# Patient Record
Sex: Male | Born: 1960 | Race: White | Hispanic: No | Marital: Single | Smoking: Current every day smoker
Health system: Southern US, Community
[De-identification: ages and names within clinical notes are randomized; demographics above are authoritative.]

## PROBLEM LIST (undated history)

## (undated) DIAGNOSIS — G9340 Encephalopathy, unspecified: Secondary | ICD-10-CM

## (undated) DIAGNOSIS — G909 Disorder of the autonomic nervous system, unspecified: Secondary | ICD-10-CM

## (undated) DIAGNOSIS — Z21 Asymptomatic human immunodeficiency virus [HIV] infection status: Secondary | ICD-10-CM

## (undated) DIAGNOSIS — F1020 Alcohol dependence, uncomplicated: Secondary | ICD-10-CM

## (undated) DIAGNOSIS — J449 Chronic obstructive pulmonary disease, unspecified: Secondary | ICD-10-CM

## (undated) DIAGNOSIS — R4020 Unspecified coma: Secondary | ICD-10-CM

## (undated) DIAGNOSIS — B20 Human immunodeficiency virus [HIV] disease: Secondary | ICD-10-CM

## (undated) DIAGNOSIS — F0280 Dementia in other diseases classified elsewhere without behavioral disturbance: Secondary | ICD-10-CM

## (undated) DIAGNOSIS — F1010 Alcohol abuse, uncomplicated: Secondary | ICD-10-CM

## (undated) HISTORY — DX: Human immunodeficiency virus (HIV) disease: B20

## (undated) HISTORY — DX: Dementia in other diseases classified elsewhere without behavioral disturbance: F02.80

## (undated) HISTORY — DX: Encephalopathy, unspecified: G93.40

## (undated) HISTORY — DX: Unspecified coma: R40.20

## (undated) HISTORY — DX: Alcohol dependence, uncomplicated: F10.20

## (undated) HISTORY — DX: Chronic obstructive pulmonary disease, unspecified: J44.9

## (undated) HISTORY — DX: Asymptomatic human immunodeficiency virus (hiv) infection status: Z21

## (undated) HISTORY — DX: Disorder of the autonomic nervous system, unspecified: G90.9

## (undated) HISTORY — DX: Alcohol abuse, uncomplicated: F10.10

---

## 2000-03-18 ENCOUNTER — Emergency Department (HOSPITAL_COMMUNITY): Admission: EM | Admit: 2000-03-18 | Discharge: 2000-03-18 | Payer: Self-pay | Admitting: Emergency Medicine

## 2000-03-18 ENCOUNTER — Encounter: Payer: Self-pay | Admitting: Emergency Medicine

## 2008-04-28 ENCOUNTER — Inpatient Hospital Stay (HOSPITAL_COMMUNITY): Admission: EM | Admit: 2008-04-28 | Discharge: 2008-05-19 | Payer: Self-pay | Admitting: Emergency Medicine

## 2008-04-29 ENCOUNTER — Ambulatory Visit: Payer: Self-pay | Admitting: Internal Medicine

## 2008-04-29 ENCOUNTER — Encounter: Payer: Self-pay | Admitting: Internal Medicine

## 2008-04-29 LAB — CONVERTED CEMR LAB
HCV Ab: NEGATIVE
Hep B S Ab: NEGATIVE

## 2008-05-19 ENCOUNTER — Encounter: Payer: Self-pay | Admitting: Internal Medicine

## 2008-05-28 ENCOUNTER — Encounter: Payer: Self-pay | Admitting: Internal Medicine

## 2008-06-04 ENCOUNTER — Ambulatory Visit: Payer: Self-pay | Admitting: Internal Medicine

## 2008-06-04 DIAGNOSIS — B459 Cryptococcosis, unspecified: Secondary | ICD-10-CM

## 2008-06-04 DIAGNOSIS — B20 Human immunodeficiency virus [HIV] disease: Secondary | ICD-10-CM | POA: Insufficient documentation

## 2008-06-04 DIAGNOSIS — F17200 Nicotine dependence, unspecified, uncomplicated: Secondary | ICD-10-CM

## 2008-06-04 LAB — CONVERTED CEMR LAB
ALT: 21 units/L (ref 0–53)
Basophils Absolute: 0.1 10*3/uL (ref 0.0–0.1)
Basophils Relative: 2 % — ABNORMAL HIGH (ref 0–1)
CO2: 24 meq/L (ref 19–32)
Eosinophils Absolute: 0.1 10*3/uL (ref 0.0–0.7)
Eosinophils Relative: 4 % (ref 0–5)
GFR calc Af Amer: >60 mL/min (ref >60)
HCT: 31.4 % — ABNORMAL LOW (ref 39.0–52.0)
Hemoglobin: 10.1 g/dL — ABNORMAL LOW (ref 13.0–17.0)
MCHC: 32.2 g/dL (ref 30.0–36.0)
Monocytes Absolute: 0.3 10*3/uL (ref 0.1–1.0)
Neutro Abs: 1 10*3/uL — ABNORMAL LOW (ref 1.7–7.7)
RDW: 15.1 % (ref 11.5–15.5)
Sodium: 137 meq/L (ref 135–145)
Total Bilirubin: 0.2 mg/dL — ABNORMAL LOW (ref 0.3–1.2)
Total Protein: 7.9 g/dL (ref 6.0–8.3)

## 2008-06-06 ENCOUNTER — Encounter: Payer: Self-pay | Admitting: Internal Medicine

## 2008-06-09 ENCOUNTER — Encounter: Payer: Self-pay | Admitting: Internal Medicine

## 2008-06-09 ENCOUNTER — Telehealth: Payer: Self-pay | Admitting: Licensed Clinical Social Worker

## 2008-07-01 ENCOUNTER — Ambulatory Visit: Payer: Self-pay | Admitting: Internal Medicine

## 2008-07-21 ENCOUNTER — Ambulatory Visit: Payer: Self-pay | Admitting: Internal Medicine

## 2008-07-21 LAB — CONVERTED CEMR LAB
ALT: 33 units/L (ref 0–53)
AST: 27 units/L (ref 0–37)
Albumin: 4 g/dL (ref 3.5–5.2)
Basophils Relative: 1 % (ref 0–1)
Calcium: 10.6 mg/dL — ABNORMAL HIGH (ref 8.4–10.5)
Chloride: 100 meq/L (ref 96–112)
Eosinophils Absolute: 0.4 10*3/uL (ref 0.0–0.7)
Eosinophils Relative: 7 % — ABNORMAL HIGH (ref 0–5)
HCT: 36.1 % — ABNORMAL LOW (ref 39.0–52.0)
HIV 1 RNA Quant: 2100 copies/mL — ABNORMAL HIGH (ref <48)
HIV-1 RNA Quant, Log: 3.32 — ABNORMAL HIGH (ref <1.68)
MCHC: 33.2 g/dL (ref 30.0–36.0)
MCV: 97.3 fL (ref 78.0–100.0)
Monocytes Relative: 8 % (ref 3–12)
Neutrophils Relative %: 67 % (ref 43–77)
Platelets: 208 10*3/uL (ref 150–400)
Potassium: 4.5 meq/L (ref 3.5–5.3)
Sodium: 136 meq/L (ref 135–145)
Total CHOL/HDL Ratio: 7.2

## 2008-07-29 ENCOUNTER — Encounter: Payer: Self-pay | Admitting: Internal Medicine

## 2008-08-07 ENCOUNTER — Ambulatory Visit: Payer: Self-pay | Admitting: Internal Medicine

## 2008-08-07 LAB — CONVERTED CEMR LAB

## 2008-08-08 ENCOUNTER — Telehealth: Payer: Self-pay | Admitting: Internal Medicine

## 2008-08-19 ENCOUNTER — Encounter: Payer: Self-pay | Admitting: Internal Medicine

## 2008-09-03 ENCOUNTER — Ambulatory Visit: Payer: Self-pay | Admitting: Internal Medicine

## 2008-09-03 LAB — CONVERTED CEMR LAB
ALT: 12 units/L (ref 0–53)
BUN: 19 mg/dL (ref 6–23)
CO2: 23 meq/L (ref 19–32)
Calcium: 10 mg/dL (ref 8.4–10.5)
Chloride: 103 meq/L (ref 96–112)
Creatinine, Ser: 1.46 mg/dL (ref 0.40–1.50)
Eosinophils Absolute: 0.2 10*3/uL (ref 0.0–0.7)
Eosinophils Relative: 4 % (ref 0–5)
Glucose, Bld: 95 mg/dL (ref 70–99)
HCT: 39.2 % (ref 39.0–52.0)
HIV-1 RNA Quant, Log: 2.77 — ABNORMAL HIGH (ref <1.68)
Hemoglobin: 13.3 g/dL (ref 13.0–17.0)
Lymphocytes Relative: 21 % (ref 12–46)
Lymphs Abs: 0.9 10*3/uL (ref 0.7–4.0)
MCV: 92.5 fL (ref 78.0–100.0)
Monocytes Absolute: 0.3 10*3/uL (ref 0.1–1.0)
Monocytes Relative: 9 % (ref 3–12)
RBC: 4.24 M/uL (ref 4.22–5.81)
Total Bilirubin: 0.2 mg/dL — ABNORMAL LOW (ref 0.3–1.2)
WBC: 4 10*3/uL (ref 4.0–10.5)

## 2008-09-08 ENCOUNTER — Telehealth (INDEPENDENT_AMBULATORY_CARE_PROVIDER_SITE_OTHER): Payer: Self-pay | Admitting: *Deleted

## 2008-09-12 ENCOUNTER — Encounter: Payer: Self-pay | Admitting: Internal Medicine

## 2008-10-21 ENCOUNTER — Ambulatory Visit: Payer: Self-pay | Admitting: Internal Medicine

## 2008-10-21 LAB — CONVERTED CEMR LAB
Crypto Ag: POSITIVE
HIV 1 RNA Quant: 78 copies/mL — ABNORMAL HIGH (ref <48)

## 2008-10-27 ENCOUNTER — Encounter: Payer: Self-pay | Admitting: Internal Medicine

## 2008-11-21 ENCOUNTER — Telehealth: Payer: Self-pay | Admitting: Internal Medicine

## 2008-11-25 ENCOUNTER — Ambulatory Visit: Payer: Self-pay | Admitting: Internal Medicine

## 2008-11-25 LAB — CONVERTED CEMR LAB: Crypto Ag: POSITIVE

## 2009-01-27 ENCOUNTER — Ambulatory Visit: Payer: Self-pay | Admitting: Internal Medicine

## 2009-01-27 DIAGNOSIS — M67919 Unspecified disorder of synovium and tendon, unspecified shoulder: Secondary | ICD-10-CM | POA: Insufficient documentation

## 2009-01-27 DIAGNOSIS — M719 Bursopathy, unspecified: Secondary | ICD-10-CM

## 2009-01-27 LAB — CONVERTED CEMR LAB
ALT: 9 units/L (ref 0–53)
AST: 16 units/L (ref 0–37)
Alkaline Phosphatase: 105 units/L (ref 39–117)
BUN: 25 mg/dL — ABNORMAL HIGH (ref 6–23)
Basophils Absolute: 0 10*3/uL (ref 0.0–0.1)
Creatinine, Ser: 1.87 mg/dL — ABNORMAL HIGH (ref 0.40–1.50)
Eosinophils Absolute: 0.1 10*3/uL (ref 0.0–0.7)
Eosinophils Relative: 4 % (ref 0–5)
HCT: 44.9 % (ref 39.0–52.0)
Lymphocytes Relative: 23 % (ref 12–46)
Lymphs Abs: 0.8 10*3/uL (ref 0.7–4.0)
Neutrophils Relative %: 64 % (ref 43–77)
Platelets: 172 10*3/uL (ref 150–400)
Potassium: 4.2 meq/L (ref 3.5–5.3)
RDW: 13.5 % (ref 11.5–15.5)
WBC: 3.4 10*3/uL — ABNORMAL LOW (ref 4.0–10.5)

## 2009-06-02 ENCOUNTER — Ambulatory Visit: Payer: Self-pay | Admitting: Internal Medicine

## 2009-11-19 ENCOUNTER — Encounter (INDEPENDENT_AMBULATORY_CARE_PROVIDER_SITE_OTHER): Payer: Self-pay | Admitting: *Deleted

## 2010-04-01 NOTE — Assessment & Plan Note (Signed)
Summary: F/U/VS   CC:  follow-up visit.  History of Present Illness: Pau.l is in for his routine visit. He denies any problems since his last visit.  He can name all of his medications it appears to be taking them correctly.  He denies missing any doses.  He says he has not started back to work because he cannot find work.  He says he is not ready to quit smoking cigarettes.  He has not been sexually active  Preventive Screening-Counseling & Management  Alcohol-Tobacco     Alcohol drinks/day: 1     Alcohol type: all     Smoking Status: current     Smoking Cessation Counseling: yes     Packs/Day: 0.5     Year Started: 1990     Year Quit: May, 2010  Caffeine-Diet-Exercise     Caffeine use/day: yes     Does Patient Exercise: yes     Type of exercise: walking     Exercise (avg: min/session): >60     Times/week: 7  Hep-HIV-STD-Contraception     HIV Risk: no risk noted     HIV Risk Counseling: not indicated-no HIV risk noted  Safety-Violence-Falls     Seat Belt Use: yes  Comments: declined condoms      Sexual History:  Kind of in a relationship.        Drug Use:  former.     Prior Medication List:  ATRIPLA 600-200-300 MG TABS (EFAVIRENZ-EMTRICITAB-TENOFOVIR) Take 1 tablet by mouth once a day DIFLUCAN 200 MG TABS (FLUCONAZOLE) Take 2 tablets by mouth once a day SEPTRA DS 800-160 MG TABS (SULFAMETHOXAZOLE-TRIMETHOPRIM) Take 1 tablet by mouth once a day ZITHROMAX 600 MG TABS (AZITHROMYCIN) Take 2 tablets by mouth once a week ALEVE 220 MG TABS (NAPROXEN SODIUM) Take 1 tablet by mouth two times a day as needed for shoulder pain   Current Allergies (reviewed today): No known allergies  Social History: Sexual History:  Kind of in a relationship Drug Use:  former  Vital Signs:  Patient profile:   50 year old male Height:      67 inches (170.18 cm) Weight:      138.5 pounds (62.95 kg) BMI:     21.77 Temp:     97.6 degrees F (36.44 degrees C) oral Pulse rate:   85  / minute BP sitting:   127 / 82  (left arm) Cuff size:   regular  Vitals Entered By: Jennet Maduro RN (June 02, 2009 9:26 AM) CC: follow-up visit Is Patient Diabetic? No Pain Assessment Patient in pain? no      Nutritional Status BMI of 19 -24 = normal Nutritional Status Detail appetite "good"  Have you ever been in a relationship where you felt threatened, hurt or afraid?No   Does patient need assistance? Functional Status Self care Ambulation Normal Comments no missed odses of rxes   Physical Exam  General:  alert and well-nourished.   Mouth:  pharynx pink and moist, no erythema, no exudates, and fair dentition.   Lungs:  normal breath sounds, no crackles, and no wheezes.   Heart:  normal rate, regular rhythm, and no murmur.   Skin:  no rashes.   Psych:  normally interactive, good eye contact, not anxious appearing, not depressed appearing, and flat affect as usual.          Medication Adherence: 06/02/2009   Adherence to medications reviewed with patient. Counseling to provide adequate adherence provided   Prevention For Positives:  06/02/2009   Safe sex practices discussed with patient. Condoms offered.                             Impression & Recommendations:  Problem # 1:  HIV DISEASE (ICD-042) His adherence is very good.  I will continue his current regimen and check a full set of blood work today. I will change his prophylactic Septra two every Monday Wednesday Friday to see if that will help speed his CD4 reconstitution. His updated medication list for this problem includes:    Diflucan 200 Mg Tabs (Fluconazole) .Marland Kitchen... Take 2 tablets by mouth once a day    Septra Ds 800-160 Mg Tabs (Sulfamethoxazole-trimethoprim) .Marland Kitchen... Take 1 tablet by mouth every m, w, and f    Zithromax 600 Mg Tabs (Azithromycin) .Marland Kitchen... 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  Clinically his meningitis has responded well to therapy. Orders: Est. Patient Level IV ([address]109) T- * Misc. Laboratory test [phone])  Medications Added to Medication List This Visit: 1)  Septra Ds 800-160 Mg Tabs (Sulfamethoxazole-trimethoprim) .... Take 1 tablet by mouth every m, w, and f  Other Orders: T-CD4SP ([facility]) (CD4SP) T-HIV Viral Load [id number]) T-Comprehensive Metabolic Panel [phone]) T-Lipid Profile [id number]) T-CBC w/Diff [phone]) T-RPR (Syphilis) ([id number])  Patient Instructions: 1)  Please schedule a follow-up appointment in 3 months.

## 2010-04-01 NOTE — Miscellaneous (Signed)
Summary: RW update  Clinical Lists Changes  Observations: Added new observation of RWPARTICIP: Yes (11/19/2009 9:09)

## 2010-04-05 ENCOUNTER — Encounter (INDEPENDENT_AMBULATORY_CARE_PROVIDER_SITE_OTHER): Payer: Self-pay | Admitting: *Deleted

## 2010-04-08 ENCOUNTER — Encounter (INDEPENDENT_AMBULATORY_CARE_PROVIDER_SITE_OTHER): Payer: Self-pay | Admitting: *Deleted

## 2010-04-15 NOTE — Miscellaneous (Signed)
Summary: RW update  Clinical Lists Changes  Observations: Added new observation of PAYOR: Medicaid (04/05/2010 11:50)

## 2010-04-15 NOTE — Miscellaneous (Signed)
Summary: RW update  Clinical Lists Changes  Observations: Added new observation of HIV RISK BEH: MSM (04/05/2010 11:45) Added new observation of MARITAL STAT: Single (04/05/2010 11:45) Added new observation of GENDER: Male (04/05/2010 11:45) Added new observation of LATINO/HISP: No (04/05/2010 11:45) Added new observation of RACE: White (04/05/2010 11:45)

## 2010-04-15 NOTE — Miscellaneous (Signed)
  Clinical Lists Changes 

## 2010-06-02 LAB — T-HELPER CELL (CD4) - (RCID CLINIC ONLY)
CD4 % Helper T Cell: 13 % — ABNORMAL LOW (ref 33–55)
CD4 T Cell Abs: 90 uL — ABNORMAL LOW (ref 400–2700)

## 2010-06-05 LAB — T-HELPER CELL (CD4) - (RCID CLINIC ONLY)
CD4 % Helper T Cell: 10 % — ABNORMAL LOW (ref 33–55)
CD4 T Cell Abs: 90 uL — ABNORMAL LOW (ref 400–2700)

## 2010-06-06 LAB — T-HELPER CELL (CD4) - (RCID CLINIC ONLY)
CD4 % Helper T Cell: 14 % — ABNORMAL LOW (ref 33–55)
CD4 T Cell Abs: 110 uL — ABNORMAL LOW (ref 400–2700)

## 2010-06-10 LAB — CULTURE, BLOOD (ROUTINE X 2): Culture: NO GROWTH

## 2010-06-10 LAB — COMPREHENSIVE METABOLIC PANEL
ALT: 11 U/L (ref 0–53)
ALT: 11 U/L (ref 0–53)
AST: 13 U/L (ref 0–37)
Albumin: 2.8 g/dL — ABNORMAL LOW (ref 3.5–5.2)
Albumin: 3.4 g/dL — ABNORMAL LOW (ref 3.5–5.2)
Alkaline Phosphatase: 45 U/L (ref 39–117)
Alkaline Phosphatase: 56 U/L (ref 39–117)
BUN: 38 mg/dL — ABNORMAL HIGH (ref 6–23)
CO2: 20 mEq/L (ref 19–32)
Calcium: 8.2 mg/dL — ABNORMAL LOW (ref 8.4–10.5)
Chloride: 112 mEq/L (ref 96–112)
Creatinine, Ser: 1.15 mg/dL (ref 0.4–1.5)
GFR calc Af Amer: >60 mL/min (ref >60)
GFR calc non Af Amer: 60 mL/min — ABNORMAL LOW (ref >60)
GFR calc non Af Amer: >60 mL/min (ref >60)
Glucose, Bld: 122 mg/dL — ABNORMAL HIGH (ref 70–99)
Potassium: 3.5 mEq/L (ref 3.5–5.1)
Potassium: 4.4 mEq/L (ref 3.5–5.1)
Sodium: 141 mEq/L (ref 135–145)
Sodium: 143 mEq/L (ref 135–145)
Total Bilirubin: 0.6 mg/dL (ref 0.3–1.2)
Total Bilirubin: 0.8 mg/dL (ref 0.3–1.2)
Total Protein: 8.6 g/dL — ABNORMAL HIGH (ref 6.0–8.3)

## 2010-06-10 LAB — CSF CELL COUNT WITH DIFFERENTIAL
Eosinophils, CSF: 0 % (ref 0–1)
Lymphs, CSF: 91 % — ABNORMAL HIGH (ref 40–80)
Lymphs, CSF: 97 % — ABNORMAL HIGH (ref 40–80)
Monocyte-Macrophage-Spinal Fluid: 2 % — ABNORMAL LOW (ref 15–45)
Monocyte-Macrophage-Spinal Fluid: 4 % — ABNORMAL LOW (ref 15–45)
Other Cells, CSF: 0
RBC Count, CSF: 12 /mm3 — ABNORMAL HIGH
Segmented Neutrophils-CSF: 0 % (ref 0–6)
Segmented Neutrophils-CSF: 2 % (ref 0–6)
Segmented Neutrophils-CSF: 3 % (ref 0–6)
WBC, CSF: 59 /mm3 — ABNORMAL HIGH (ref 0–5)

## 2010-06-10 LAB — IRON AND TIBC
Saturation Ratios: 34 % (ref 20–55)
UIBC: 161 ug/dL

## 2010-06-10 LAB — BASIC METABOLIC PANEL
BUN: 16 mg/dL (ref 6–23)
BUN: 17 mg/dL (ref 6–23)
BUN: 10 mg/dL (ref 6–23)
BUN: 12 mg/dL (ref 6–23)
BUN: 19 mg/dL (ref 6–23)
BUN: 9 mg/dL (ref 6–23)
BUN: 9 mg/dL (ref 6–23)
CO2: 23 mEq/L (ref 19–32)
CO2: 21 mEq/L (ref 19–32)
CO2: 21 mEq/L (ref 19–32)
CO2: 23 mEq/L (ref 19–32)
CO2: 24 mEq/L (ref 19–32)
CO2: 25 mEq/L (ref 19–32)
Calcium: 7.9 mg/dL — ABNORMAL LOW (ref 8.4–10.5)
Calcium: 8 mg/dL — ABNORMAL LOW (ref 8.4–10.5)
Calcium: 7.7 mg/dL — ABNORMAL LOW (ref 8.4–10.5)
Calcium: 8.5 mg/dL (ref 8.4–10.5)
Calcium: 8.6 mg/dL (ref 8.4–10.5)
Calcium: 8.9 mg/dL (ref 8.4–10.5)
Chloride: 104 mEq/L (ref 96–112)
Chloride: 111 mEq/L (ref 96–112)
Chloride: 110 mEq/L (ref 96–112)
Chloride: 112 mEq/L (ref 96–112)
Chloride: 114 mEq/L — ABNORMAL HIGH (ref 96–112)
Chloride: 117 mEq/L — ABNORMAL HIGH (ref 96–112)
Chloride: 121 mEq/L — ABNORMAL HIGH (ref 96–112)
Creatinine, Ser: 1.21 mg/dL (ref 0.4–1.5)
Creatinine, Ser: 1.31 mg/dL (ref 0.4–1.5)
Creatinine, Ser: 1.46 mg/dL (ref 0.4–1.5)
Creatinine, Ser: 1.01 mg/dL (ref 0.4–1.5)
Creatinine, Ser: 1.02 mg/dL (ref 0.4–1.5)
Creatinine, Ser: 1.02 mg/dL (ref 0.4–1.5)
Creatinine, Ser: 1.03 mg/dL (ref 0.4–1.5)
Creatinine, Ser: 1.06 mg/dL (ref 0.4–1.5)
Creatinine, Ser: 1.06 mg/dL (ref 0.4–1.5)
Creatinine, Ser: 1.09 mg/dL (ref 0.4–1.5)
GFR calc Af Amer: >60 mL/min (ref >60)
GFR calc Af Amer: >60 mL/min (ref >60)
GFR calc Af Amer: >60 mL/min (ref >60)
GFR calc Af Amer: >60 mL/min (ref >60)
GFR calc Af Amer: >60 mL/min (ref >60)
GFR calc Af Amer: >60 mL/min (ref >60)
GFR calc Af Amer: >60 mL/min (ref >60)
GFR calc non Af Amer: 52 mL/min — ABNORMAL LOW (ref >60)
GFR calc non Af Amer: >60 mL/min (ref >60)
GFR calc non Af Amer: >60 mL/min (ref >60)
Glucose, Bld: 90 mg/dL (ref 70–99)
Glucose, Bld: 133 mg/dL — ABNORMAL HIGH (ref 70–99)
Potassium: 3.4 mEq/L — ABNORMAL LOW (ref 3.5–5.1)
Potassium: 3.6 mEq/L (ref 3.5–5.1)
Sodium: 138 mEq/L (ref 135–145)
Sodium: 138 mEq/L (ref 135–145)
Sodium: 140 mEq/L (ref 135–145)

## 2010-06-10 LAB — CBC
HCT: 27.1 % — ABNORMAL LOW (ref 39.0–52.0)
HCT: 29.5 % — ABNORMAL LOW (ref 39.0–52.0)
HCT: 39.7 % (ref 39.0–52.0)
HCT: 42.3 % (ref 39.0–52.0)
HCT: 47.8 % (ref 39.0–52.0)
Hemoglobin: 10.2 g/dL — ABNORMAL LOW (ref 13.0–17.0)
Hemoglobin: 10.3 g/dL — ABNORMAL LOW (ref 13.0–17.0)
Hemoglobin: 13.5 g/dL (ref 13.0–17.0)
Hemoglobin: 14.6 g/dL (ref 13.0–17.0)
Hemoglobin: 16.6 g/dL (ref 13.0–17.0)
MCHC: 35.9 g/dL (ref 30.0–36.0)
MCHC: 34.4 g/dL (ref 30.0–36.0)
MCHC: 34.9 g/dL (ref 30.0–36.0)
MCHC: 35.2 g/dL (ref 30.0–36.0)
MCHC: 35.6 g/dL (ref 30.0–36.0)
MCHC: 35.7 g/dL (ref 30.0–36.0)
MCHC: 35.9 g/dL (ref 30.0–36.0)
MCV: 87 fL (ref 78.0–100.0)
MCV: 86.9 fL (ref 78.0–100.0)
MCV: 87.4 fL (ref 78.0–100.0)
MCV: 87.9 fL (ref 78.0–100.0)
MCV: 88.2 fL (ref 78.0–100.0)
MCV: 89.4 fL (ref 78.0–100.0)
MCV: 89.6 fL (ref 78.0–100.0)
Platelets: 117 10*3/uL — ABNORMAL LOW (ref 150–400)
Platelets: 135 10*3/uL — ABNORMAL LOW (ref 150–400)
Platelets: 109 10*3/uL — ABNORMAL LOW (ref 150–400)
Platelets: 110 10*3/uL — ABNORMAL LOW (ref 150–400)
Platelets: 123 10*3/uL — ABNORMAL LOW (ref 150–400)
Platelets: 126 10*3/uL — ABNORMAL LOW (ref 150–400)
Platelets: 192 10*3/uL (ref 150–400)
Platelets: 86 10*3/uL — ABNORMAL LOW (ref 150–400)
Platelets: 95 10*3/uL — ABNORMAL LOW (ref 150–400)
RBC: 2.63 MIL/uL — ABNORMAL LOW (ref 4.22–5.81)
RBC: 2.66 MIL/uL — ABNORMAL LOW (ref 4.22–5.81)
RBC: 3.29 MIL/uL — ABNORMAL LOW (ref 4.22–5.81)
RBC: 3.3 MIL/uL — ABNORMAL LOW (ref 4.22–5.81)
RBC: 3.31 MIL/uL — ABNORMAL LOW (ref 4.22–5.81)
RBC: 3.34 MIL/uL — ABNORMAL LOW (ref 4.22–5.81)
RBC: 3.4 MIL/uL — ABNORMAL LOW (ref 4.22–5.81)
RBC: 3.88 MIL/uL — ABNORMAL LOW (ref 4.22–5.81)
RBC: 4.33 MIL/uL (ref 4.22–5.81)
RDW: 15.4 % (ref 11.5–15.5)
RDW: 15.4 % (ref 11.5–15.5)
RDW: 13.4 % (ref 11.5–15.5)
RDW: 13.7 % (ref 11.5–15.5)
RDW: 14 % (ref 11.5–15.5)
WBC: 3.3 10*3/uL — ABNORMAL LOW (ref 4.0–10.5)
WBC: 4.2 10*3/uL (ref 4.0–10.5)
WBC: 2.5 10*3/uL — ABNORMAL LOW (ref 4.0–10.5)
WBC: 2.7 10*3/uL — ABNORMAL LOW (ref 4.0–10.5)
WBC: 2.8 10*3/uL — ABNORMAL LOW (ref 4.0–10.5)
WBC: 2.8 10*3/uL — ABNORMAL LOW (ref 4.0–10.5)
WBC: 3 10*3/uL — ABNORMAL LOW (ref 4.0–10.5)
WBC: 3.2 10*3/uL — ABNORMAL LOW (ref 4.0–10.5)
WBC: 5.8 10*3/uL (ref 4.0–10.5)

## 2010-06-10 LAB — MAGNESIUM
Magnesium: 1.6 mg/dL (ref 1.5–2.5)
Magnesium: 2 mg/dL (ref 1.5–2.5)
Magnesium: 2.2 mg/dL (ref 1.5–2.5)
Magnesium: 2.5 mg/dL (ref 1.5–2.5)

## 2010-06-10 LAB — PHOSPHORUS: Phosphorus: 3 mg/dL (ref 2.3–4.6)

## 2010-06-10 LAB — DIFFERENTIAL
Basophils Absolute: 0.1 10*3/uL (ref 0.0–0.1)
Basophils Relative: 2 % — ABNORMAL HIGH (ref 0–1)
Eosinophils Absolute: 0 10*3/uL (ref 0.0–0.7)
Eosinophils Relative: 0 % (ref 0–5)
Monocytes Absolute: 0.4 10*3/uL (ref 0.1–1.0)
Monocytes Relative: 8 % (ref 3–12)

## 2010-06-10 LAB — RETICULOCYTES
RBC.: 2.83 MIL/uL — ABNORMAL LOW (ref 4.22–5.81)
Retic Ct Pct: 1 % (ref 0.4–3.1)

## 2010-06-10 LAB — CD4/CD8 (T-HELPER/T-SUPPRESSOR CELL)
CD4 absolute: 6 /uL — ABNORMAL LOW (ref 500–1900)
CD8 T Cell Abs: 59 /uL — ABNORMAL LOW (ref 230–1000)
CD8tox: 350 % — ABNORMAL HIGH (ref 15.0–40.0)
Ratio: 0.03 — ABNORMAL LOW (ref 1.0–3.0)

## 2010-06-10 LAB — URINALYSIS, ROUTINE W REFLEX MICROSCOPIC
Hgb urine dipstick: NEGATIVE
Protein, ur: 30 mg/dL — AB
Urobilinogen, UA: 1 mg/dL (ref 0.0–1.0)

## 2010-06-10 LAB — HIV ANTIBODY (ROUTINE TESTING W REFLEX): HIV: REACTIVE — AB

## 2010-06-10 LAB — CSF CULTURE W GRAM STAIN

## 2010-06-10 LAB — GC/CHLAMYDIA PROBE AMP, URINE
Chlamydia, Swab/Urine, PCR: NEGATIVE
GC Probe Amp, Urine: NEGATIVE

## 2010-06-10 LAB — GLUCOSE, CSF: Glucose, CSF: 16 mg/dL — ABNORMAL LOW (ref 43–76)

## 2010-06-10 LAB — VITAMIN B12
Vitamin B-12: 513 pg/mL (ref 211–911)
Vitamin B-12: 307 pg/mL (ref 211–911)

## 2010-06-10 LAB — VDRL, CSF: VDRL Quant, CSF: NONREACTIVE

## 2010-06-10 LAB — URINE MICROSCOPIC-ADD ON

## 2010-06-10 LAB — GRAM STAIN

## 2010-06-10 LAB — HEPATITIS B SURFACE ANTIGEN: Hepatitis B Surface Ag: NEGATIVE

## 2010-06-10 LAB — PROTIME-INR: INR: 1.3 (ref 0.00–1.49)

## 2010-06-10 LAB — PROTEIN, CSF: Total  Protein, CSF: 227 mg/dL — ABNORMAL HIGH (ref 15–45)

## 2010-06-10 LAB — CRYPTOCOCCAL ANTIGEN, CSF

## 2010-06-10 LAB — PROTEIN AND GLUCOSE, CSF: Glucose, CSF: 16 mg/dL — ABNORMAL LOW (ref 43–76)

## 2010-06-19 ENCOUNTER — Emergency Department (HOSPITAL_COMMUNITY): Payer: Medicaid Other

## 2010-06-19 ENCOUNTER — Inpatient Hospital Stay (HOSPITAL_COMMUNITY)
Admission: EM | Admit: 2010-06-19 | Discharge: 2010-07-03 | DRG: 974 | Disposition: A | Discharge status: Skilled Nursing Facility | Payer: Medicaid Other | Attending: Family Medicine | Admitting: Family Medicine

## 2010-06-19 DIAGNOSIS — Z5900 Homelessness unspecified: Secondary | ICD-10-CM

## 2010-06-19 DIAGNOSIS — R21 Rash and other nonspecific skin eruption: Secondary | ICD-10-CM | POA: Diagnosis not present

## 2010-06-19 DIAGNOSIS — Z9119 Patient's noncompliance with other medical treatment and regimen: Secondary | ICD-10-CM

## 2010-06-19 DIAGNOSIS — I959 Hypotension, unspecified: Secondary | ICD-10-CM | POA: Diagnosis present

## 2010-06-19 DIAGNOSIS — N179 Acute kidney failure, unspecified: Secondary | ICD-10-CM | POA: Diagnosis present

## 2010-06-19 DIAGNOSIS — F17200 Nicotine dependence, unspecified, uncomplicated: Secondary | ICD-10-CM | POA: Diagnosis present

## 2010-06-19 DIAGNOSIS — B20 Human immunodeficiency virus [HIV] disease: Secondary | ICD-10-CM

## 2010-06-19 DIAGNOSIS — Z681 Body mass index (BMI) 19 or less, adult: Secondary | ICD-10-CM

## 2010-06-19 DIAGNOSIS — E43 Unspecified severe protein-calorie malnutrition: Secondary | ICD-10-CM | POA: Diagnosis present

## 2010-06-19 DIAGNOSIS — Z79899 Other long term (current) drug therapy: Secondary | ICD-10-CM

## 2010-06-19 DIAGNOSIS — F0280 Dementia in other diseases classified elsewhere without behavioral disturbance: Secondary | ICD-10-CM | POA: Diagnosis present

## 2010-06-19 DIAGNOSIS — D61818 Other pancytopenia: Secondary | ICD-10-CM | POA: Diagnosis present

## 2010-06-19 DIAGNOSIS — F1410 Cocaine abuse, uncomplicated: Secondary | ICD-10-CM | POA: Diagnosis present

## 2010-06-19 DIAGNOSIS — E872 Acidosis, unspecified: Secondary | ICD-10-CM | POA: Diagnosis not present

## 2010-06-19 DIAGNOSIS — F1010 Alcohol abuse, uncomplicated: Secondary | ICD-10-CM | POA: Diagnosis present

## 2010-06-19 DIAGNOSIS — Z91199 Patient's noncompliance with other medical treatment and regimen due to unspecified reason: Secondary | ICD-10-CM

## 2010-06-19 DIAGNOSIS — R64 Cachexia: Secondary | ICD-10-CM | POA: Diagnosis present

## 2010-06-19 DIAGNOSIS — F1210 Cannabis abuse, uncomplicated: Secondary | ICD-10-CM | POA: Diagnosis present

## 2010-06-19 DIAGNOSIS — A812 Progressive multifocal leukoencephalopathy: Secondary | ICD-10-CM | POA: Diagnosis present

## 2010-06-19 DIAGNOSIS — R4182 Altered mental status, unspecified: Secondary | ICD-10-CM

## 2010-06-19 LAB — URINALYSIS, ROUTINE W REFLEX MICROSCOPIC
Hgb urine dipstick: NEGATIVE
Ketones, ur: NEGATIVE mg/dL
Nitrite: NEGATIVE
Specific Gravity, Urine: 1.016 (ref 1.005–1.030)
pH: 6 (ref 5.0–8.0)

## 2010-06-19 LAB — URINE MICROSCOPIC-ADD ON

## 2010-06-19 LAB — GRAM STAIN

## 2010-06-19 LAB — CBC
Platelets: 97 10*3/uL — ABNORMAL LOW (ref 150–400)
RBC: 3.15 MIL/uL — ABNORMAL LOW (ref 4.22–5.81)
WBC: 3.2 10*3/uL — ABNORMAL LOW (ref 4.0–10.5)

## 2010-06-19 LAB — GLUCOSE, CAPILLARY: Glucose-Capillary: 107 mg/dL — ABNORMAL HIGH (ref 70–99)

## 2010-06-19 LAB — DIFFERENTIAL
Basophils Absolute: 0 10*3/uL (ref 0.0–0.1)
Eosinophils Absolute: 0.2 10*3/uL (ref 0.0–0.7)
Lymphocytes Relative: 30 % (ref 12–46)
Neutrophils Relative %: 52 % (ref 43–77)

## 2010-06-19 LAB — RAPID URINE DRUG SCREEN, HOSP PERFORMED
Amphetamines: NOT DETECTED
Barbiturates: NOT DETECTED
Opiates: NOT DETECTED

## 2010-06-19 LAB — BASIC METABOLIC PANEL
Calcium: 7.7 mg/dL — ABNORMAL LOW (ref 8.4–10.5)
Creatinine, Ser: 1.66 mg/dL — ABNORMAL HIGH (ref 0.4–1.5)
GFR calc Af Amer: 53 mL/min — ABNORMAL LOW (ref >60)
GFR calc non Af Amer: 44 mL/min — ABNORMAL LOW (ref >60)
Sodium: 140 mEq/L (ref 135–145)

## 2010-06-19 LAB — OCCULT BLOOD, POC DEVICE: Fecal Occult Bld: POSITIVE

## 2010-06-19 LAB — TYPE AND SCREEN: Antibody Screen: NEGATIVE

## 2010-06-19 LAB — CRYPTOCOCCAL ANTIGEN, CSF

## 2010-06-19 LAB — PROCALCITONIN: Procalcitonin: <0.1 ng/mL

## 2010-06-19 LAB — ABO/RH: ABO/RH(D): O POS

## 2010-06-19 LAB — LACTIC ACID, PLASMA: Lactic Acid, Venous: 3 mmol/L — ABNORMAL HIGH (ref 0.5–2.2)

## 2010-06-20 DIAGNOSIS — B20 Human immunodeficiency virus [HIV] disease: Secondary | ICD-10-CM

## 2010-06-20 DIAGNOSIS — R4182 Altered mental status, unspecified: Secondary | ICD-10-CM

## 2010-06-20 LAB — CSF CELL COUNT WITH DIFFERENTIAL
RBC Count, CSF: 1 /mm3 — ABNORMAL HIGH
WBC, CSF: 4 /mm3 (ref 0–5)

## 2010-06-20 LAB — COMPREHENSIVE METABOLIC PANEL
ALT: <8 U/L (ref 0–53)
Albumin: 2.3 g/dL — ABNORMAL LOW (ref 3.5–5.2)
BUN: 16 mg/dL (ref 6–23)
Calcium: 7.4 mg/dL — ABNORMAL LOW (ref 8.4–10.5)
Glucose, Bld: 95 mg/dL (ref 70–99)
Potassium: 3.8 mEq/L (ref 3.5–5.1)
Sodium: 141 mEq/L (ref 135–145)
Total Protein: 5.8 g/dL — ABNORMAL LOW (ref 6.0–8.3)

## 2010-06-20 LAB — IRON AND TIBC
Saturation Ratios: 11 % — ABNORMAL LOW (ref 20–55)
UIBC: 132 ug/dL

## 2010-06-20 LAB — CBC
HCT: 31 % — ABNORMAL LOW (ref 39.0–52.0)
MCHC: 32.3 g/dL (ref 30.0–36.0)
Platelets: 106 10*3/uL — ABNORMAL LOW (ref 150–400)
RDW: 15.5 % (ref 11.5–15.5)
WBC: 2.6 10*3/uL — ABNORMAL LOW (ref 4.0–10.5)

## 2010-06-20 LAB — FERRITIN: Ferritin: 806 ng/mL — ABNORMAL HIGH (ref 22–322)

## 2010-06-20 LAB — PROTEIN, CSF: Total  Protein, CSF: 58 mg/dL — ABNORMAL HIGH (ref 15–45)

## 2010-06-20 LAB — VITAMIN B12: Vitamin B-12: 365 pg/mL (ref 211–911)

## 2010-06-20 LAB — CREATININE, URINE, RANDOM: Creatinine, Urine: 45.4 mg/dL

## 2010-06-21 LAB — COMPREHENSIVE METABOLIC PANEL
ALT: <8 U/L (ref 0–53)
Alkaline Phosphatase: 43 U/L (ref 39–117)
CO2: 19 mEq/L (ref 19–32)
Chloride: 122 mEq/L — ABNORMAL HIGH (ref 96–112)
GFR calc non Af Amer: >60 mL/min (ref >60)
Glucose, Bld: 91 mg/dL (ref 70–99)
Potassium: 4 mEq/L (ref 3.5–5.1)
Sodium: 144 mEq/L (ref 135–145)
Total Bilirubin: 0.2 mg/dL — ABNORMAL LOW (ref 0.3–1.2)
Total Protein: 4.7 g/dL — ABNORMAL LOW (ref 6.0–8.3)

## 2010-06-21 LAB — URINE CULTURE
Colony Count: NO GROWTH
Culture  Setup Time: 201204221127

## 2010-06-21 LAB — CBC
HCT: 30.2 % — ABNORMAL LOW (ref 39.0–52.0)
Hemoglobin: 9.8 g/dL — ABNORMAL LOW (ref 13.0–17.0)
MCV: 86.8 fL (ref 78.0–100.0)
WBC: 2.8 10*3/uL — ABNORMAL LOW (ref 4.0–10.5)

## 2010-06-21 NOTE — H&P (Signed)
NAME:  Brian Marquez, Brian Marquez NO.:  [id number]  MEDICAL RECORD NO.:  [id number]           PATIENT TYPE:  I  LOCATION:  5114                         FACILITY:  [facility]  PHYSICIAN:  Santiago Bumpers. Hensel, M.D.DATE OF BIRTH:  June 21, 1960  DATE OF ADMISSION:  06/19/2010 DATE OF DISCHARGE:                             HISTORY & PHYSICAL   PRIMARY CARE PHYSICIAN:  Cliffton Asters, MD  CHIEF COMPLAINT:  Weakness, altered mental status.  HISTORY OF PRESENT ILLNESS:  This is a 50 year old male with a history of HIV who presented to the ED with altered mental status and weakness. The patient is a poor historian.  Per ED physician, the [place].  EMS was called by a passerby. When EMS arrived, the patient was agreeable to come to the hospital because he "felt bad" in general.  The patient is homeless and says that he sleeps where ever he can.  He does complain of generalized weakness, fatigue, and dizziness.  He denies any pain.  The patient is reluctant to talk about his sexual history or diagnosis of HIV.  He cannot recall events of his hospitalization in 2010 when he was diagnosed with cryptococcal meningitis.  The patient was instructed to take Diflucan indefinitely for maintenance therapy.  Per the patient, he stopped taking the medication approximately 6 months ago.  We were called by the ED to admit for both hypotension and altered mental status.  The patient's blood pressure corrected in the ED after an IVF bolus.  ALLERGIES:  No known drug allergies.  MEDICATIONS:  None.  PAST MEDICAL HISTORY: 1. HIV. 2. Cryptococcal meningitis in 2010. 3. Thrombocytopenia. 4. Anemia of chronic disease.  PAST SURGICAL HISTORY:  None.  SEXUAL HISTORY:  The patient says he has sex with one male partner, does not use protection.  SOCIAL HISTORY:  Homeless, smokes half a pack per day.  Quit drinking 6 months ago.  Drugs; has a history of using  marijuana and cocaine.  FAMILY HISTORY:  Mother and father unknown.  No siblings.  REVIEW OF SYSTEMS:  Denies fevers, chills, sweats, headache, chest pain. Denies cough, dyspnea, nausea, vomiting, diarrhea, bright red blood per rectum, melena, abdominal pain, dysuria, endorses rhinorrhea, and weakness.  PHYSICAL EXAMINATION:  VITAL SIGNS:  Temperature 99.2, pulse 75, respirations 24, blood pressure 112/66, pulse ox 100% on room air. GENERAL:  Flat affect, no acute distress. HEENT: PERRLA.  EOMI.  Oropharynx difficult to visualize, appears dry. No stridor with rhinorrhea. NECK:  Shotty cervical adenopathy, nontender, full range of motion. CARDIOVASCULAR:  Distant heart sounds, regular rate and rhythm. LUNGS:  Clear to auscultation bilaterally.  No wheezes, rales, or rhonchi. ABDOMEN:  Soft, nontender, and nondistended.  Active bowel sounds. BACK:  Skin around the rectal area was excoriated, scabbing and erythematous. GU: Normal exam. EXTREMITIES:  No CCE, scabbing and pinpoint regions on all four extremities. NEUROLOGIC:  Alert and oriented to self only.  Cranial nerves II through XII are intact.  Good proprioception.  No motor or sensory deficits. MSK:  Dorsalis pedis pulses are strong and equal bilaterally.  LABS  AND STUDIES:  White count 3.2, hemoglobin 8.9, hematocrit 27.8, platelets 97, retic 0.4%.  Sodium 140, potassium 3.8, chloride 114, CO2 22, BUN 19, creatinine 1.66 and glucose 75.  Lactic acid 3. Procalcitonin less than 10.  FOBT positive.  UA; 30 protein, small bilirubin.  UDS negative.  CK 51.  Chest x-ray; no evidence of active cardiopulmonary disease.  CT of head; progression of generalized volume from volume loss and calcification in the left basal ganglia may be due to prior cryptococcal meningitis.  No acute abnormalities.  ASSESSMENT AND PLAN:  This is a 50 year old male with a history of human immunodeficiency virus who presents with altered mental  status, weakness, and low blood pressures. 1. Altered mental status likely secondary to a recurrence of     cryptococcal meningitis, although there may be a psych component     contributing to this patient's confusion.  We will hold any     antifungal therapy until seen by Infectious Disease.  We will call     Infectious Disease tonight for further recommendations.  We will     consider an LP in the morning.  Repeat a CMET in the morning and     await urine and blood cultures. 2. Human immunodeficiency virus.  We will check a HIV viral load and a     CD4 count and follow up with Infectious Disease recommendations. 3. Anemia/thrombocytopenia.  We will repeat a CBC in the morning. 4. Hypertension.  We will given IV fluid bolus 1 liter now and then     continue with IV fluids at 200 mL an hour.  We will check blood     pressures every 4 hours on the floor. 5. Fluids, electrolytes, nutrition/gastrointestinal.  Regular diet.     IV fluids at 200 mL an hour. 6. Prophylaxis.  Heparin subcu, may consider changing to SCDs due to     the patient's anemia. 7. Disposition.  Pending clinical improvement.    ______________________________ Barnabas Lister, MD   ______________________________ Santiago Bumpers Leveda Anna, M.D.    ID/MEDQ  D:  06/19/2010  T:  06/20/2010  Job:  [id number]  Electronically Signed by Barnabas Lister MD on 06/20/2010 08:05:27 PM Electronically Signed by Doralee Albino M.D. on 06/21/2010 11:14:59 AM

## 2010-06-22 DIAGNOSIS — B20 Human immunodeficiency virus [HIV] disease: Secondary | ICD-10-CM

## 2010-06-22 DIAGNOSIS — R4182 Altered mental status, unspecified: Secondary | ICD-10-CM

## 2010-06-22 LAB — BASIC METABOLIC PANEL
BUN: 13 mg/dL (ref 6–23)
CO2: 19 mEq/L (ref 19–32)
Calcium: 7.7 mg/dL — ABNORMAL LOW (ref 8.4–10.5)
Creatinine, Ser: 1.22 mg/dL (ref 0.4–1.5)
Glucose, Bld: 87 mg/dL (ref 70–99)

## 2010-06-22 LAB — CBC
HCT: 31.7 % — ABNORMAL LOW (ref 39.0–52.0)
Hemoglobin: 10.4 g/dL — ABNORMAL LOW (ref 13.0–17.0)
MCH: 28.3 pg (ref 26.0–34.0)
MCHC: 32.8 g/dL (ref 30.0–36.0)

## 2010-06-23 ENCOUNTER — Telehealth: Payer: Self-pay | Admitting: *Deleted

## 2010-06-23 ENCOUNTER — Inpatient Hospital Stay (HOSPITAL_COMMUNITY): Payer: Medicaid Other

## 2010-06-23 LAB — BASIC METABOLIC PANEL
BUN: 11 mg/dL (ref 6–23)
Calcium: 8.7 mg/dL (ref 8.4–10.5)
GFR calc non Af Amer: >60 mL/min (ref >60)
Glucose, Bld: 112 mg/dL — ABNORMAL HIGH (ref 70–99)
Potassium: 4.3 mEq/L (ref 3.5–5.1)

## 2010-06-23 LAB — RPR: RPR Ser Ql: NONREACTIVE

## 2010-06-23 LAB — CBC
HCT: 33.4 % — ABNORMAL LOW (ref 39.0–52.0)
MCH: 28.3 pg (ref 26.0–34.0)
MCHC: 32.6 g/dL (ref 30.0–36.0)
MCV: 86.8 fL (ref 78.0–100.0)
RDW: 16.8 % — ABNORMAL HIGH (ref 11.5–15.5)

## 2010-06-23 LAB — CSF CULTURE W GRAM STAIN

## 2010-06-23 MED ORDER — GADOBENATE DIMEGLUMINE 529 MG/ML IV SOLN
10.0000 mL | Freq: Once | INTRAVENOUS | Status: AC
  Administered 2010-06-23: 10 mL via INTRAVENOUS

## 2010-06-23 NOTE — Telephone Encounter (Signed)
Hospital nurse called to inquire about the patient having a hospital F/U and if the patient is to have meds upon release from the hospital. Adv her that Kim at [phone] schedules our hospital F/U's and we will not be able to order meds until he has a visit with [place].

## 2010-06-25 DIAGNOSIS — F4320 Adjustment disorder, unspecified: Secondary | ICD-10-CM

## 2010-06-25 LAB — CULTURE, BLOOD (ROUTINE X 2): Culture: NO GROWTH

## 2010-06-25 LAB — BASIC METABOLIC PANEL
CO2: 29 mEq/L (ref 19–32)
Calcium: 9.2 mg/dL (ref 8.4–10.5)
Glucose, Bld: 98 mg/dL (ref 70–99)
Potassium: 4.6 mEq/L (ref 3.5–5.1)
Sodium: 142 mEq/L (ref 135–145)

## 2010-06-25 LAB — CBC
HCT: 30.1 % — ABNORMAL LOW (ref 39.0–52.0)
Hemoglobin: 9.9 g/dL — ABNORMAL LOW (ref 13.0–17.0)
MCHC: 32.9 g/dL (ref 30.0–36.0)

## 2010-06-26 LAB — CBC
HCT: 31.1 % — ABNORMAL LOW (ref 39.0–52.0)
Hemoglobin: 10.1 g/dL — ABNORMAL LOW (ref 13.0–17.0)
MCH: 28.3 pg (ref 26.0–34.0)
MCHC: 32.5 g/dL (ref 30.0–36.0)

## 2010-06-26 LAB — BASIC METABOLIC PANEL
BUN: 15 mg/dL (ref 6–23)
CO2: 31 mEq/L (ref 19–32)
Calcium: 8.9 mg/dL (ref 8.4–10.5)
Creatinine, Ser: 1.18 mg/dL (ref 0.4–1.5)
Glucose, Bld: 89 mg/dL (ref 70–99)

## 2010-06-26 LAB — CULTURE, BLOOD (ROUTINE X 2)

## 2010-06-29 LAB — BASIC METABOLIC PANEL
CO2: 32 mEq/L (ref 19–32)
Calcium: 9.1 mg/dL (ref 8.4–10.5)
Creatinine, Ser: 1.65 mg/dL — ABNORMAL HIGH (ref 0.4–1.5)
Glucose, Bld: 93 mg/dL (ref 70–99)

## 2010-06-29 LAB — CBC
HCT: 31.3 % — ABNORMAL LOW (ref 39.0–52.0)
Hemoglobin: 10.2 g/dL — ABNORMAL LOW (ref 13.0–17.0)
MCH: 28.7 pg (ref 26.0–34.0)
MCHC: 32.6 g/dL (ref 30.0–36.0)

## 2010-06-29 NOTE — Consult Note (Signed)
  NAME:  Brian, Marquez NO.:  [id number]  MEDICAL RECORD NO.:  [id number]           PATIENT TYPE:  I  LOCATION:  5114                         FACILITY:  [facility]  PHYSICIAN:  Eulogio Ditch, MD DATE OF BIRTH:  1960/09/16  DATE OF CONSULTATION:  06/25/2010 DATE OF DISCHARGE:                                CONSULTATION   REASON FOR CONSULTATION:  Capacity.  HISTORY OF PRESENT ILLNESS:  A 50 year old male who has a history of HIV was admitted [place] he [place] railroad track.  The patient was admitted on altered mental status and generalized weakness.  The patient is also noncompliant with his HIV medications.  At this time, the patient is unable to provide any logical information. He do not know why he is in the hospital, what kind of medical problems he has, and what medications he need for his HIV treatment.  I spoke with the RN she told me that the patient is unable to take care of himself.  He is incontinent of urine and feces, and lays in that and do not want to change his gown.  She also told me that he just had no questions, but he is unable to make any conversation.  The patient is alert, but he is not oriented to time, place and person. He is internally preoccupied.  Hygiene and grooming fair.  Insight and judgment poor.  The rest of the mental status is altered.  Recommendations at this time, the patient do not have capacity to make decisions for himself.  The patient is going to skilled nursing facility.  The patient cannot limb by himself.  He is danger to himself at this time.     Eulogio Ditch, MD     SA/MEDQ  D:  06/25/2010  T:  06/25/2010  Job:  [id number]  Electronically Signed by Eulogio Ditch  on 06/29/2010 06:07:06 PM

## 2010-07-01 DIAGNOSIS — R4182 Altered mental status, unspecified: Secondary | ICD-10-CM

## 2010-07-01 NOTE — Consult Note (Signed)
  NAME:  Brian Marquez, Brian Marquez NO.:  [id number]  MEDICAL RECORD NO.:  [id number]           PATIENT TYPE:  I  LOCATION:  5114                         FACILITY:  [facility]  PHYSICIAN:  Eulogio Ditch, MD DATE OF BIRTH:  Aug 22, 1960  DATE OF CONSULTATION:  07/01/2010 DATE OF DISCHARGE:                                CONSULTATION   REASON FOR CONSULT:  Capacity.  HISTORY OF PRESENT ILLNESS:  A 50 year old male with a history of HIV who [place] railroad track.  I saw this patient before on June 25, 2010, and at that time, the patient was not able to provide any information.  Today, when I saw the patient, the patient is able to make some conversation.  He is redirectable during the interview, but is still confused.  On asking about his medication, he started walking out of the room and told me "lets checkout".  When I asked him that hospital is looking for a place for him like a skilled nursing facility or assisted-living facility, he said that is okay with him and he can stay there for a while, but he do not want to stay there for a long time like 5-6 months.  The patient denied any past mental health issues or seen by psychiatrist.  On speaking with the nursing staff, the nurse told me the patient still walks out naked in the hall and sometimes urinate on himself and he is still incontinent.  The patient was alert, awake, oriented to the time, place, and person, but seems to be still internally preoccupied at certain times during the interview.  His insight and judgment is still poor.  The patient still do not have capacity to make decisions for himself. He is still danger to himself.  The patient will need at this time placement.   Dictation ended at this point     Eulogio Ditch, MD     SA/MEDQ  D:  07/01/2010  T:  07/01/2010  Job:  [id number]  Electronically Signed by Eulogio Ditch  on 07/01/2010 06:35:51 PM

## 2010-07-06 NOTE — Discharge Summary (Signed)
NAME:  Brian Marquez, Brian Marquez NO.:  [id number]  MEDICAL RECORD NO.:  [id number]           PATIENT TYPE:  I  LOCATION:  5114                         FACILITY:  [facility]  PHYSICIAN:  Nestor Ramp, MD        DATE OF BIRTH:  February 17, 1961  DATE OF ADMISSION:  06/19/2010 DATE OF DISCHARGE:  07/03/2010                              DISCHARGE SUMMARY   PRIMARY CARE PROVIDER:  None.  Infectiou disease: Dr. Orvan Falconer  REASON FOR HOSPITALIZATION:   1. Altered mental status, weakness, and fatigue 2. Concern for cryptococcus meningoencephalitis in this patient with HIV and previous history of disease  DISCHARGE DIAGNOSES: 1. 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 2. 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  DISCHARGE MEDICATIONS:  New medications; 1. Azithromycin 1200 mg p.o. weekly. 2. Atripla 1 tablet p.o. at bedtime. 3. Ensure one beverage p.o. t.i.d. after meals. 4. Flora-Q 1 capsule p.o. daily. 5. Fluconazole 200 mg p.o. daily. 6. Megace 800 mg p.o. daily. 7. Multivitamin with minerals one p.o. daily. 8. Bactrim double strength 1 tablet p.o. daily. 9. Triamcinolone 0.5% cream one application topically 4 times daily as     needed for itching.  CONSULTATIONS:  Infectious Disease.  PROCEDURES: 1. Lumbar puncture. 2. Head CT shows volume loss and calcification in the left basal     ganglia, which may be due to prior cryptococcal infection.  No     acute abnormality. 3. MRI of the brain: nonspecific subcortical white matter changes supratentorially and     in the left paramedian pons. 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  PERTINENT LABS:  CD-4 count 20 with T helper of 3%.  Viral load greater than 3,000,000.  Toxoplasma IgG elevated at 77.9.  Toxoplasma PCR, none detected.  VDRL nonreactive, RPR nonreactive.  CSF  culture, no growth.  BRIEF HOSPITAL COURSE:  This is a 50 year old homeless male with a history of HIV who was noncompliant with his anti-retroviral medications presenting with altered mental status, weakness, and fatigue. 1. HIV/AIDS: There was initially concern for Cryptococcus     meningoencephalitis based on the patient's previous history of this     disease. However, this was later thought to be unlikely considering his     negative CSF cultures and based on his head imaging.  Infectious     Disease was consulted who thought his presentation was     likely due to PML versus vasculitis versus AIDS dementia.  The     patient had a low CD-4 count of 20 with a viral load of greater     than 3,000,000.  The patient had been homeless for the past several     months and was inconsistently taking his HAART.   ID restarted     the patient on Atripla.  They also started the patient on Septra     for PCP prophylaxis, fluconazole for Cryptococcus prophylaxis     considering his  past medical history, and azithromycin for MAC     prophylaxis.  The patient's mental status improved significantly     during his hospitalization while on his treatment for his HIV/AIDS. The patient's mental status significantly improved during his hospitalization. He was alert and oriented to name, month and year, place and appropriate to questions for several days preceding his discharge. PT and OT saw him during his stay but they had  signed-off before the patient was discharged due to his increased stability.   2. Capacitance: psychiatry was consulted on June 25, 2010 to assess the patient for capacity and found him lacking. Guardianship was requested for the patient to make him a ward of the state. This was later withdrawn as the patient's mental status  improved. As a formality, Psychiatry was asked to re-consult the patient regarding capacity on 05/03, but they were unable to see the patient in a timely fashion. As  teh patient's mental status ahd significantly improved and he was clearly competent to  make decisions, we allowed him to decide whether or not he wanted the SNF bed when it became available which he did. He was discharged to a SNF on 07/03/2010.   2. Disposition.  Social work worked very hard to find placement for the patient. He was finally approved for a 30-day letter-of-guarantee and accepted to SN[place] [facility] [place] The SNF was willing to take     the patient to his  counseling and to his medical appointment     at [facility] where he will be also getting his     medications.   FOLLOWUP APPOINTMENTS:  The patient is to follow up with [facility] and was asked to make a follow-up appointment with ID sub-specialist Dr. Daiva Eves who saw him during his hospitalization 1-2 weeks following his discharge.    ______________________________ Priscella Mann, MD   ______________________________ Nestor Ramp, MD   AO/MEDQ  D:  07/03/2010  T:  07/03/2010  Job:  [id number]  Electronically Signed by Priscella Mann MD on 07/03/2010 11:17:07 AM Electronically Signed by Denny Levy MD on 07/06/2010 09:57:58 AM

## 2010-07-13 ENCOUNTER — Encounter: Payer: Self-pay | Admitting: Infectious Disease

## 2010-07-13 ENCOUNTER — Ambulatory Visit (INDEPENDENT_AMBULATORY_CARE_PROVIDER_SITE_OTHER): Payer: Self-pay | Admitting: Infectious Disease

## 2010-07-13 ENCOUNTER — Telehealth: Payer: Self-pay | Admitting: *Deleted

## 2010-07-13 VITALS — BP 107/72 | HR 79 | Temp 98.1°F | Ht 67.0 in | Wt 117.5 lb

## 2010-07-13 DIAGNOSIS — A812 Progressive multifocal leukoencephalopathy: Secondary | ICD-10-CM

## 2010-07-13 DIAGNOSIS — B459 Cryptococcosis, unspecified: Secondary | ICD-10-CM

## 2010-07-13 DIAGNOSIS — F1910 Other psychoactive substance abuse, uncomplicated: Secondary | ICD-10-CM | POA: Insufficient documentation

## 2010-07-13 DIAGNOSIS — Z5900 Homelessness unspecified: Secondary | ICD-10-CM

## 2010-07-13 DIAGNOSIS — B20 Human immunodeficiency virus [HIV] disease: Secondary | ICD-10-CM

## 2010-07-13 DIAGNOSIS — G9340 Encephalopathy, unspecified: Secondary | ICD-10-CM

## 2010-07-13 LAB — CBC WITH DIFFERENTIAL/PLATELET
Eosinophils Absolute: 0.3 10*3/uL (ref 0.0–0.7)
Eosinophils Relative: 5 % (ref 0–5)
Hemoglobin: 10.3 g/dL — ABNORMAL LOW (ref 13.0–17.0)
Lymphs Abs: 1.1 10*3/uL (ref 0.7–4.0)
MCH: 29.5 pg (ref 26.0–34.0)
MCV: 92 fL (ref 78.0–100.0)
Monocytes Relative: 8 % (ref 3–12)
RBC: 3.49 MIL/uL — ABNORMAL LOW (ref 4.22–5.81)

## 2010-07-13 LAB — COMPREHENSIVE METABOLIC PANEL
AST: 13 U/L (ref 0–37)
Alkaline Phosphatase: 70 U/L (ref 39–117)
BUN: 23 mg/dL (ref 6–23)
Glucose, Bld: 87 mg/dL (ref 70–99)
Sodium: 136 mEq/L (ref 135–145)
Total Bilirubin: 0.2 mg/dL — ABNORMAL LOW (ref 0.3–1.2)
Total Protein: 7.3 g/dL (ref 6.0–8.3)

## 2010-07-13 NOTE — Assessment & Plan Note (Signed)
Part of this could have been due to direct neurotoxicity of his HIV virus. His viral load was in excess of 3 million copies. There is also question of PML being present. He has responded quite well to antiretroviral therapy.

## 2010-07-13 NOTE — Consult Note (Signed)
NAME:  BENTLEY, FISSEL NO.:  [id number]   MEDICAL RECORD NO.:  [id number]          PATIENT TYPE:  INP   LOCATION:  2608                         FACILITY:  [facility]   PHYSICIAN:  Marlan Palau, M.D.  DATE OF BIRTH:  [birth year]   DATE OF CONSULTATION:  04/28/2008  DATE OF DISCHARGE:                                 CONSULTATION   HISTORY OF PRESENT ILLNESS:  Brian Marquez is a 50 year old right-handed  white male, born Mar 03, 1960, with a history of upper respiratory  tract infection with cough and problems with diarrhea over the last 10  days.  The patient has been running possibly some low-grade fevers and  has had headache.  The patient had become increasingly confused and  ataxic over the last 4-5 days.  The patient has been brought in for an  evaluation.  The patient has a history of homosexuality and has had a  weight loss over the last 2 months, clearly predating his recent upper  respiratory tract infection.  CT of the head shows left basal ganglia  low-density area.  Lumbar puncture was done and reveals a glucose of 16,  protein of 227, cells are pending.  Cryptococcal antigen is positive.  Neurology was called for further evaluation.   PAST MEDICAL HISTORY:  Significant for  1. History of altered mental status with cryptococcal meningitis.  2. Left brain low-density area, rule out stroke or other lesion such      as lymphoma or toxoplasmosis.  3. Upper respiratory tract infection recently.   The patient was on no medications prior to admission.  No prior history  of surgery.  Smokes a pack of cigarettes a day.  Does drink alcohol on a  regular basis.  He has no known allergies.   SOCIAL HISTORY:  The patient is single, lives with a homosexual partner,  has no children.  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   FAMILY MEDICAL HISTORY:  Mother and father are still alive, he claims.  The patient has 2 older sisters.  No known medical  problems noted by the  patient.   REVIEW OF SYSTEMS:  Notable for some slight fevers, cough, and diarrhea.  Denies chest pain.  Has had some abdominal discomfort and headache.  Does note some shortness of breath.  Denies any problems controlling the  bladder.  Has not had any fainting episodes.  He has had some dizziness.   PHYSICAL EXAMINATION:  VITAL SIGNS:  Blood pressure is 98/63, heart rate  74, respiratory rate 24, and temperature 99.1 rectally.  GENERAL:  The patient is a thin white male who is alert and cooperative  at the time of examination.  HEENT:  Atraumatic.  Eyes, pupils are round and reactive to light.  Discs are soft and flat bilaterally.  NECK:  Supple.  No carotid bruits noted.  RESPIRATORY:  Clear.  CARDIOVASCULAR:  Regular rate and rhythm.  No obvious murmurs or rubs  noted.  EXTREMITIES:  Without significant edema.  NEUROLOGIC:  Cranial nerves as above.  Facial symmetry is present.  The  patient has good sensation of the face to pinprick and soft touch  bilaterally.  He has good strength in facial muscle, muscle of the head  turn and shoulder shrug bilaterally.  Speech is well enunciated, not  aphasic.  Motor testing reveals 5/5 strength in all 4s.  Good symmetric  motor tone is noted throughout.  Sensory testing is intact to pinprick,  soft touch, and vibratory sensation throughout.  The patient is able to  perform finger-nose-finger and heel-to-shin bilaterally.  No drift is  seen.  The patient has symmetric reflexes, upgoing toe on the left and  neutral on the right.  The left foot appears to be slightly dusky  compared the right.   LABORATORY VALUES:  Notable for sodium 141, potassium 4.4, chloride of  112, CO2 of 20, glucose of 131, BUN of 38, creatinine of 1.59, total  bili of 0.8, alk phosphatase is 56, SGOT of 17, SGPT of 11, total  protein 8.6, albumin of 3.4, calcium 9.3, ammonia level 25, and lactic  acid level 1.5.  White count of 5.8, hemoglobin  of 16.6, hematocrit of  47.8, MCV of 90.8, and platelets of 192.  Glucose in spinal fluid is 16,  total protein 227, cryptococcal antigen positive, cells are pending.   IMPRESSION:  1. Cryptococcal meningitis.  2. 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  3. Altered mental status secondary cryptococcal meningitis.   This patient is alert but slow to respond and is somewhat apraxic on  examination, oriented to person and place, not date.  The patient has  evidence of cryptococcal meningitis but has had a 69-month history of  weight loss, clearly predating the most recent illness beginning 10 days  ago.  The patient does have low-density area in the left base ganglia  area which will need to be evaluated.  Need to rule out stroke event,  possibly from a vasculitis set up from cryptococcal meningitis or  possibly even from another infectious source such as endocarditis.  Need  to rule out lymphoma of CNS or toxoplasmosis.  Infectious Disease will  be involved.  We will follow this patient during this hospitalization.  Again, the patient will need an MRI with and without gadolinium.   Thank you very much.      Marlan Palau, M.D.  Electronically Signed     CKW/MEDQ  D:  04/28/2008  T:  04/29/2008  Job:  [id number]   cc:   [facility]

## 2010-07-13 NOTE — Group Therapy Note (Signed)
NAME:  Brian Marquez, Brian Marquez NO.:  [id number]   MEDICAL RECORD NO.:  [id number]          PATIENT TYPE:  INP   LOCATION:  5526                         FACILITY:  [facility]   PHYSICIAN:  Peggye Pitt, M.D. DATE OF BIRTH:  25-Aug-1960                                 PROGRESS NOTE   DIAGNOSES UP TO DATE:  1. Cryptococcal meningitis.  2. Advanced HIV/AIDS disease.  3. Hypokalemia.  4. Thrombocytopenia.   MEDICATIONS:  Medications upon discharge will be dictated by discharging physician.   HOSPITAL COURSE UP TO DATE:  1. Brian Marquez is a 50 year old Caucasian man who was initially      admitted on April 28, 2008 who presented with a 4-day history of      alteration in mental status.  He also has been feeling very      fatigue.  He denied any diarrhea.  He has had a significant amount      of weight loss over the past several months.  In the emergency      room, he had a workup which confirmed cryptococcal meningitis and      the Hospital Service was asked to admit for further evaluation and      management.  Consultation with Infectious Disease in the name of      Dr. Orvan Falconer was a requested.  He was immediately started on      amphotericin B and flucytosine.  He has maintained good renal      function.  He did have a transient dip in his platelet counts which      we attributed to the flucytosine, however, his dose was decreased      and his platelets are now recovering nicely.  He still has to be on      the amphotericin B and flucytosine for five more days and then Dr.      Orvan Falconer has recommended that he transitioned over to p.o.,      Diflucan.  Length of treatment with Diflucan will need to be      further determined by Infectious Disease.  2. Advance HIV/AIDS.  His CD-4 count is 6.  He has been started on PCP      and MAC prophylaxis.  3. Hypokalemia.  We had been repleting and his magnesium level has      been normal.   DISPOSITION:  I believe he will probably  remain in the hospital for the next 5 days to  complete his amphotericin B treatment.  Then likely he will probably  need to go to a nursing facility given his severe weakness and nobody  cared for him at home.  I have requested a PT/OT consultation and I have  also asked care management and social work to help [place] with this  disposition starting with the discharge planning.   Hospital course will be further updated by discharging physician.   Vital signs on day of this dictation; blood pressure 114/75, heart rate  88, respirations 20, and O2 sats 96% on room air with a temperature of  97.6.   LABORATORY  DATA:  On day of this dictation; sodium 138, potassium 3.4, chloride 109,  bicarb 25, BUN 10, creatinine 1.06 with a glucose of 99. WBCs 2.7,  hemoglobin 10.3 and a platelet count 124.  acute      Peggye Pitt, M.D.  Electronically Signed     EH/MEDQ  D:  05/06/2008  T:  05/06/2008  Job:  [id number]

## 2010-07-13 NOTE — Discharge Summary (Signed)
NAME:  Brian Marquez, Brian Marquez NO.:  [id number]   MEDICAL RECORD NO.:  [id number]          PATIENT TYPE:  INP   LOCATION:  5526                         FACILITY:  [facility]   PHYSICIAN:  Peggye Pitt, M.D. DATE OF BIRTH:  04/22/1960   DATE OF ADMISSION:  04/28/2008  DATE OF DISCHARGE:  05/19/2008                               DISCHARGE SUMMARY   ADDENDUM   This is just to add:   DISCHARGE DIAGNOSIS:  Tobacco abuse.   DISCHARGE MEDICATIONS:  Nicotine patch 21 mg to change daily.   For the rest of the discharge diagnoses, medications and hospital  course, please refer to prior discharge summary dictated earlier today.      Peggye Pitt, M.D.  Electronically Signed     EH/MEDQ  D:  05/19/2008  T:  05/19/2008  Job:  [id number]

## 2010-07-13 NOTE — Telephone Encounter (Signed)
Lawanda from SNF called requesting samples of Atripla.  She is applying for Medicaid for patient.  He has Medicare, but not Part D Wendall Mola CMA

## 2010-07-13 NOTE — Assessment & Plan Note (Signed)
Continue Atripla recheck viral load and CD4 count we'll check a genotype and viral load is still elevated. His viral level was over 3 million copies was in the hospital. He could have some unoriented minority variance with the resistant so we will be checking a viral with a genotype continue Bactrim for PCP prophylaxis. 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

## 2010-07-13 NOTE — Assessment & Plan Note (Signed)
The above discussion. He seems to be improving quite well in terms of his neurological functioning his strength in his cognition.

## 2010-07-13 NOTE — Discharge Summary (Signed)
NAME:  Brian Marquez, Brian Marquez NO.:  [id number]   MEDICAL RECORD NO.:  [id number]          PATIENT TYPE:  INP   LOCATION:  5526                         FACILITY:  [facility]   PHYSICIAN:  Ruthy Dick, MD    DATE OF BIRTH:  Nov 25, 1960   DATE OF ADMISSION:  04/28/2008  DATE OF DISCHARGE:                               DISCHARGE SUMMARY   REASON FOR ADMISSION:  Altered mental status.   FINAL DISCHARGE DIAGNOSES:  1. Cryptococcal meningitis.  2. 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  3. Generalized weakness.  4. Hypokalemia, resolved.  5. Thrombocytopenia, resolving.  6. Chronic anemia most likely from chronic kidney disease.   CONSULTATION:  During this admission, Infectious Disease consult with  Dr. Orvan Falconer, and Neurology consult with Dr. Anne Hahn.   BRIEF HISTORY OF PRESENT ILLNESS:  Brian Marquez is a 50 year old  Caucasian male with no significant past medical history, who came in  with altered mentation and ended up with lumbar puncture which showed  antigens to Cryptococcus.  He was started on treatment for Cryptococcus  meningitis with amphotericin B and flucytosine for 14 days.  The patient  has completed a course of therapy today and is now being transitioned to  Diflucan at a high dose.  Literature points to 400 mg daily for at least  8 or 10 weeks.  I would defer final say on this to Infectious Disease  physician as to length of stay with Diflucan.  During his treatment  here, his altered mentation seems to have finally improved.  He was very  fragile at first but in the last 2-3 days the patient has become more  oriented to person and place.  He is still having issues with time.  The  plan for now would be to sent the patient to skilled nursing facility if  he agrees to it and to take it from there.  His CD4 count was noted to  be above 6 and he has been started on standard prophylaxis for MAC  and  PCP pneumonia.  I saw the patient today.  He said he was still little  weak but he says he is feeling much better than he was before.  No chest  pain.  No shortness of breath.  No abdominal pain.  No nausea.  No  vomiting.  No diarrhea.  No constipation.   PHYSICAL EXAMINATION:  VITALS:  Temperature 97.6, pulse 54, respirations  19, blood pressure 106/67, and saturating 99% on room air.  CHEST:  Clear to auscultation bilaterally.  ABDOMEN:  Soft, nontender.  EXTREMITIES:  No clubbing.  No cyanosis.  No edema.  CARDIOVASCULAR:  First and second heart sounds only.  CENTRAL NERVOUS SYSTEM:  Nonfocal.   The patient will have to follow up with Infectious Disease doctor as  scheduled by Dr. Orvan Falconer, and we will also follow with the primary care  physician either at the skilled nursing facility or one to be provided  by the social service here.  Followup with primary care physician should  be in 1 week.   DISCHARGE MEDICATIONS:  1. Zithromax 500 mg p.o. q. weekly on Wednesdays.  2. Bactrim double strength 1 tablet p.o. daily.  3. Fluconazole 400 mg p.o. daily for about 10 weeks to be confirmed by      Infectious Disease.   During this admission, the patient had the following procedures done.  1. CT scan of the head, which showed scattered hypoattenuation of the      left basal ganglia likely related to ischemia, but is age      indeterminate, this could be acute or subacute.  There was also      scattered sinus disease.  2. MRI/MRA of the head was done and was read as multiple dilated      perivascular spaces throughout the brain, concentrated in the basal      ganglia, greater on the right which displayed restricted diffusion.      The findings are most consistent with cerebral cryptococcal      infection.  The MRA part of the reading was negative.  The patient      had lumbar puncture x2.  The second lumbar puncture showed a normal      opening pressure, which was a reason for  the repeat to rule out      increased intracranial pressure.  Review of the lumbar puncture lab      work did show positivity for cryptococcal antigen and titer greater      than 256, segmented neutrophils was 95 with a white count of 280.   It is recommended that the patient have physical and occupational  therapy in the nursing home or home health with physical therapy.   TIME USED FOR DISCHARGE PLANNING:  Greater than 30 minutes.    N/B: Pt did not leave on this day.  Addendum to d/c summary will be dictated at the time of final d/c.      Ruthy Dick, MD  Electronically Signed     GU/MEDQ  D:  05/12/2008  T:  05/13/2008  Job:  [id number]

## 2010-07-13 NOTE — H&P (Signed)
NAME:  Brian Marquez, GAVIA NO.:  [id number]   MEDICAL RECORD NO.:  [id number]          PATIENT TYPE:  INP   LOCATION:                               FACILITY:  [facility]   PHYSICIAN:  Lucita Ferrara, MD         DATE OF BIRTH:  12-24-1960   DATE OF ADMISSION:  04/28/2008  DATE OF DISCHARGE:                              HISTORY & PHYSICAL   CHIEF COMPLAINT:  Alteration in mental status.  The patient is a 50-year-  old homosexual male who presents with 4 days of alterations in mental  status.  Described as not knowing where he is, knowing time, person.  The patient denies any focal neurological deficits.  The patient has  subjective fevers.  The patient states that he also has been feeling  fatigued, had a upper respiratory tract infection.  The patient's  partner is currently not accompanying him today.  He was brought in by  coworkers.  The patient denies any diarrhea.  He has had a significant  amount of weight loss in the last several months.  He had a full  emergency room workup which confirmed cryptococcal meningitis.  A  neurologist and infectious disease have already been called.   The patient has had a significant amount of decreased p.o. intake.  The  patient denies diarrhea.  The patient denies nausea or vomiting.  The  patient denies focal abdominal pain.   PAST MEDICAL HISTORY:  None.   SURGERIES:  The patient currently smokes.  Denies drugs, alcohol or  tobacco.  Lives with his partner   ALLERGIES:  No known drug allergies.   MEDICATIONS:  None.   REVIEW OF SYSTEMS:  The patient denies any focal neurological deficits,  deficiency in his speech, numbness, tingling.  The patient denies HIV  disease.  The patient denies recent corticosteroids.  The patient denies  recent travel.   PHYSICAL EXAMINATION:  GENERAL:  The patient is in no acute.  Blood  pressure is 102/73, pulse 75, respirations 18, pulse oximetry 97% on  room air.  HEENT:  Normocephalic,  atraumatic.  Sclerae is anicteric.  NECK:  Supple.  CARDIOVASCULAR:  S1-S2.  Regular rate and rhythm.  No murmurs, rubs or  clicks.  LUNGS:  Clear to auscultation bilaterally.  No rhonchi, rales or  wheezes.  ABDOMEN:  Soft, nontender, nondistended.  Positive bowel sounds.  EXTREMITIES:  No clubbing, cyanosis or edema.  NEUROLOGICAL:  Patient is not alert and oriented.  Cranial nerves II-XII  grossly intact, however.  There are no focal deficits.  The patient  minimally responds to commands.   LABORATORY DATA:  The patient had a lumbar puncture which showed a white  count of 67, a CSF neutrophil count of 3, CSF glucose of 16 which is  low, protein of 227, cryptococcal antigen positive, cryptococcal antigen  titer greater than 256.  Lactic acid is 1.5.  Complete metabolic panel  shows a BUN of 38, creatinine 1.59.  White count 5.8, hemoglobin 16.6,  hematocrit 47.8, platelet count 192.  A CT scan of the head  shows  scattered a hypoattenuation of the left basal ganglia likely related to  ischemia but indeterminate.  This could be acute on subacute.  Chest x-  ray shows there is no evidence of acute cardiac or pulmonary process.   ASSESSMENT/PLAN:  The patient is a 50 year old male with:  1. Alterations in mental status, likely secondary to encephalopathy      secondary to infectious etiology given the patient has meningitis.      Rule out other causes of alterations in mental status including      ischemia, malignancy.  Rule out lymphoma given his probable immune      deficiency.  Rule out human immunodeficiency virus.  2. CT showing hypoattenuation, likely secondary to vasculitis and      cryptococcal meningitis induced.   DISCUSSION AND PLAN:  The patient has already been evaluated by Dr.  Anne Hahn from neurology.  Will go ahead and proceed with admission of the  patient to the step-down unit and monitor mental status for now.  I had  a long discussion with the patient's friends.  I  asked them to relay a  message to the patient's partner, and the patient's partner will present  now.  We will have discussions regarding end of life issues including  resuscitation.  Will go ahead and proceed with admission.  I have  already spoken to infectious disease on call, Dr. Orvan Falconer.  The patient  will likely benefit from amphotericin.  We will go ahead and proceed  with HIV viral load and will go ahead and proceed with CD-4 count, HIV  antibody test.  Per infectious diseases, the patient will likely need to  be ruled out for other causes of space-occupying lesion in the CNS or  comorbid conditions.  Prophylaxis for PCP pneumonia/prophylaxis for MAC  per infectious diseases.  The patient will need to get an MRI/MRA of the  brain with Gadolinium.  The patient will need hydration.  Will need to  monitor creatinine.  Proceed with MRI/MRA of the brain.  The patient  will likely benefit from 2-D echocardiogram, rule out thromboembolic  event.  The rest of the plans are dependent on his progress.  Will  obviously get panculture.      Lucita Ferrara, MD  Electronically Signed     RR/MEDQ  D:  04/28/2008  T:  04/28/2008  Job:  [id number]

## 2010-07-13 NOTE — Assessment & Plan Note (Signed)
He claims to be clean at this point in time.

## 2010-07-13 NOTE — Assessment & Plan Note (Signed)
He is currently residing in an assisted living facility. This is very helpful for him. He is apparently getting onto Medicaid which is also something critical for this man who is clearly disabled.

## 2010-07-13 NOTE — Discharge Summary (Signed)
NAME:  ANDREWS, TENER NO.:  [id number]   MEDICAL RECORD NO.:  [id number]          PATIENT TYPE:  INP   LOCATION:  5526                         FACILITY:  [facility]   PHYSICIAN:  Peggye Pitt, M.D. DATE OF BIRTH:  1961/02/21   DATE OF ADMISSION:  04/28/2008  DATE OF DISCHARGE:  05/19/2008                               DISCHARGE SUMMARY   DISCHARGE DIAGNOSES:  1. 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  2. Cryptococcal meningitis.  3. Peripherally inserted central catheter-associated coagulase-      negative staph bacteremia.  4. Thrombocytopenia, mild.  5. Anemia of chronic disease.   DISCHARGE MEDICATIONS:  1. Fluconazole 400 mg daily indefinitely.  2. Ancef 2 g IV every 8 hours via PICC until June 02, 2008.  3. Bactrim Double Strength 1 tablet daily.  4. Azithromycin 1200 mg weekly on Wednesdays.   DISPOSITION AND FOLLOWUP:  Mr. Demma will be discharged to nursing  facility in stable condition.  He is to remain on the fluconazole  indefinitely for his cryptococcal meningitis until further seen by Dr.  Cliffton Asters.  He will arrange an appointment, and call nursing  facility with appointment time and date.  For his coag-negative staph  bacteremia, he will also be on IV Ancef until April 5.   CONSULTATION THIS HOSPITALIZATION:  Dr. Orvan Falconer with infectious  disease.   IMAGES AND PROCEDURES PERFORMED DURING THIS HOSPITALIZATION:  1. A CT scan of the head without contrast on April 28, 2008 that showed      scattered hypoattenuation of the left basal ganglia.  It is likely      related to ischemia but is age indeterminate.  Scattered sinus      disease as described.  2. A chest x-ray on April 28, 2008 that showed no evidence for acute      cardiac or pulmonary process.  3. An MRI, MRA of the head without contrast on April 29, 2008 that      shows multiple dilated perivascular space is  throughout the brain      concentrated in the basal ganglia greater on the left which display      restricted diffusion; the findings were most consistent with      cerebral cryptococcal infection.  4. The patient also had a lumbar puncture on May 08, 2008.  5. A 2-D echocardiogram on April 29, 2008 that showed normal left      ventricular systolic function with no regional wall motion      abnormalities.   History and physical exam, for full details please refer to dictation by  Dr. Flonnie Overman on April 28, 2008, but in brief Mr. Lofton is a 50 year old  Caucasian man who presented with a 4-day history of altered mental  status.  He had been having subjective fevers and had been feeling  fatigued.  He denied any diarrhea.  In the emergency room he had a  workup which confirmed cryptococcal meningitis.  For that reason he was  admitted to the hospital for further  evaluation and management.   HOSPITAL COURSE:  1. Cryptococcal meningitis.  This was confirmed both by CSF cultures      as well as by serum cryptococcal antigen and also by blood      cultures.  He was immediately started on amphotericin B and      flucytosine.  We have greatly counted with the care of Dr. Orvan Falconer      throughout his prolonged hospitalization for management of his      cryptococcal meningitis.  He has now completed the course of      amphotericin B and flucytosine, and will be on Diflucan 100 mg      daily indefinitely until further discontinued by infectious      disease.  He still remains a bit confused and his partner, Mr.      Katrinka Blazing, does not believe that he would be safe to go home on his      own.  Hence, he will be discharged to a nursing facility for      rehabilitation purposes.  2. For his advanced HIV/AIDS, he has had a CD-4 count in the hospital      of 6.  He has been negative for hepatitis and RPR.  He will be sent      home on MAC  and PCP prophylaxis given his low CD-4 counts.  3. For his  PICC-associated CNS bacteremia.  The patient started      developing fevers.  Blood cultures were ordered which showed      coagulase-negative staph.  He was placed on vancomycin.  Cultures,      however, came back for MSSE, and his vancomycin was changed to      Ancef per ID recommendations.  He will need to be on Ancef for 2      weeks as further stated above.  A new PICC line has been placed.  4. Thrombocytopenia initially dropped significantly with the      flucytosine which required a reduction in dose of the above      medication.  Subsequently after the discontinuation of flucytosine,      his platelet count continued to rise.  At time of discharge it is      135.  5. No other chronic medical issues this hospitalization.   VITAL SIGNS ON DAY OF DISCHARGE:  Blood pressure 92/54, heart rate 65,  respirations 20, O2 sats 95% on room air with a temp of 98.3.   LABORATORIES ON DAY OF DISCHARGE:  WBC 3.3, hemoglobin 9.5, and a  platelet count of 135.   LABORATORIES AND STUDIES PENDING AT TIME OF THIS DICTATION:  The last of  blood cultures which so far have shown no growth to date, but these will  need to be followed.      Peggye Pitt, M.D.  Electronically Signed     EH/MEDQ  D:  05/19/2008  T:  05/19/2008  Job:  [id number]   cc:   Cliffton Asters, M.D.

## 2010-07-13 NOTE — Assessment & Plan Note (Signed)
He has not had evidence of recurrence but needs to continue on fluconazole so long as his immune system remain so compromised.

## 2010-07-13 NOTE — Progress Notes (Signed)
Subjective:    Patient ID: Brian Marquez, male    DOB: 1961/02/02, 50 y.o.   MRN: [id number]  HPI  50 year old Caucasian man with HIV/AIDS, prior polysubstance abuse, and homelessness followed by Dr. Orvan Falconer who was admitted to the [facility] service last month after being found to have progressive confusion and weakness. He underwent LP and had an MRI of the brain, the latter of which showed findings consistent with possible PML. He also had diffuse atrophy that had worsened as well. He was restarted on atripla in house. At that time he was still delirious, frequently sitting in his own urine. He was deemed to be incompetent and arrangements were made for placement in a SNF. Ultimately his cognition improved and he was again felt to be competent. However he was agreeable to placement in a skilled nursing facility been staying at the assisted living facility since then. He claims to be compliant with the Atripla records indicate this. His strength has improved his cognition also seems dramatically improved. His mood is much better and well. He asked me to he can have some ensure to take 3 times daily and I wrote prescription for this today.  I spent him to 45 minutes of time with the patient  including greater than 50% of time spent in coordination coordination of his care and counseling the patient and the provider came with him today. Of note he also did mention some feeling of fogginess during the day which I expect is probably due to his Atripla.  [facility] fax number [phone]    Review of Systems  Constitutional: Positive for fatigue. Negative for fever, chills, diaphoresis, activity change, appetite change and unexpected weight change.  HENT: Negative for hearing loss, congestion, facial swelling, rhinorrhea, neck pain, neck stiffness, dental problem and ear discharge.   Eyes: Negative for photophobia and visual disturbance.  Respiratory: Negative for apnea, cough, choking, chest  tightness, shortness of breath and stridor.   Cardiovascular: Negative for chest pain, palpitations and leg swelling.  Gastrointestinal: Negative for abdominal pain, constipation, blood in stool, abdominal distention and anal bleeding.  Genitourinary: Negative for dysuria, flank pain, enuresis and difficulty urinating.  Musculoskeletal: Negative for myalgias, back pain, joint swelling, arthralgias and gait problem.  Skin: Negative for color change, pallor and rash.  Neurological: Positive for weakness. Negative for dizziness, tremors, syncope, facial asymmetry, numbness and headaches.  Hematological: Negative for adenopathy. Does not bruise/bleed easily.  Psychiatric/Behavioral: Positive for decreased concentration. Negative for hallucinations, behavioral problems, confusion and agitation.       Objective:   Physical Exam  Constitutional: He is oriented to person, place, and time. No distress.  HENT:  Head: Normocephalic and atraumatic.  Mouth/Throat: Oropharynx is clear and moist. No oropharyngeal exudate.  Eyes: Conjunctivae are normal. Pupils are equal, round, and reactive to light. No scleral icterus.  Neck: Normal range of motion. Neck supple. No JVD present.  Cardiovascular: Normal rate, regular rhythm and normal heart sounds.  Exam reveals no gallop and no friction rub.   No murmur heard. Pulmonary/Chest: No respiratory distress. He has no wheezes. He has no rales. He exhibits no tenderness.  Abdominal: He exhibits no distension and no mass. There is no tenderness. There is no rebound and no guarding.  Musculoskeletal: He exhibits no edema and no tenderness.  Lymphadenopathy:    He has no cervical adenopathy.  Neurological: He is alert and oriented to person, place, and time.  Skin: Skin is warm and dry. No  rash noted. He is not diaphoretic. No erythema. There is pallor.  Psychiatric: His behavior is normal. Thought content normal.          Assessment & Plan:  HIV  DISEASE Continue Atripla recheck viral load and CD4 count we'll check a genotype and viral load is still elevated. His viral level was over 3 million copies was in the hospital. He could have some unoriented minority variance with the resistant so we will be checking a viral with a genotype continue Bactrim for PCP prophylaxis. 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 His viral load was in excess of 3 million copies. There is also question of PML being present. He has responded quite well to antiretroviral therapy.  CRYPTOCOCCAL MENINGITIS He has not had evidence of recurrence but needs to continue on fluconazole so long as his immune system remain so compromised.  PML (progressive multifocal leukoencephalopathy) The above discussion. He seems to be improving quite well in terms of his neurological functioning his strength in his cognition.  Polysubstance abuse He claims to be clean at this point in time.  Homelessness He is currently residing in an assisted living facility. This is very helpful for him. He is apparently getting onto Medicaid which is also something critical for this man who is clearly disabled.

## 2010-07-14 LAB — HIV-1 RNA ULTRAQUANT REFLEX TO GENTYP+: HIV 1 RNA Quant: 29500 copies/mL — ABNORMAL HIGH (ref <20)

## 2010-07-20 LAB — HIV-1 GENOTYPR PLUS

## 2010-08-18 ENCOUNTER — Ambulatory Visit (INDEPENDENT_AMBULATORY_CARE_PROVIDER_SITE_OTHER): Payer: Medicaid Other | Admitting: Infectious Disease

## 2010-08-18 ENCOUNTER — Encounter: Payer: Self-pay | Admitting: Infectious Disease

## 2010-08-18 VITALS — BP 135/88 | HR 96 | Temp 98.4°F | Wt 137.8 lb

## 2010-08-18 DIAGNOSIS — B20 Human immunodeficiency virus [HIV] disease: Secondary | ICD-10-CM

## 2010-08-18 DIAGNOSIS — B459 Cryptococcosis, unspecified: Secondary | ICD-10-CM

## 2010-08-18 DIAGNOSIS — F1910 Other psychoactive substance abuse, uncomplicated: Secondary | ICD-10-CM

## 2010-08-18 DIAGNOSIS — G9340 Encephalopathy, unspecified: Secondary | ICD-10-CM

## 2010-08-18 LAB — CBC WITH DIFFERENTIAL/PLATELET
Basophils Absolute: 0 10*3/uL (ref 0.0–0.1)
Basophils Relative: 1 % (ref 0–1)
Eosinophils Absolute: 0.1 10*3/uL (ref 0.0–0.7)
Eosinophils Relative: 2 % (ref 0–5)
HCT: 40.1 % (ref 39.0–52.0)
MCHC: 33.2 g/dL (ref 30.0–36.0)
MCV: 101 fL — ABNORMAL HIGH (ref 78.0–100.0)
Monocytes Absolute: 0.6 10*3/uL (ref 0.1–1.0)
Neutro Abs: 3.9 10*3/uL (ref 1.7–7.7)
RDW: 18.7 % — ABNORMAL HIGH (ref 11.5–15.5)

## 2010-08-18 LAB — COMPREHENSIVE METABOLIC PANEL
AST: 18 U/L (ref 0–37)
Alkaline Phosphatase: 62 U/L (ref 39–117)
BUN: 24 mg/dL — ABNORMAL HIGH (ref 6–23)
Creat: 1.25 mg/dL (ref 0.50–1.35)
Total Bilirubin: 0.2 mg/dL — ABNORMAL LOW (ref 0.3–1.2)

## 2010-08-18 NOTE — Assessment & Plan Note (Signed)
Hopefully this is not a common obstacle again to his taking his intervertebral and caring for herself.

## 2010-08-18 NOTE — Patient Instructions (Signed)
I need you to meet with our counselor regarding your Atripla IT IS CRITICAL THAT YOU HAVE THIS MEDICATION I need you to meet with a case manager We will refer you to psychiatry outpatient for formal competency clearance

## 2010-08-18 NOTE — Assessment & Plan Note (Signed)
Continue Atripla and PCP prophylaxis. The big issue is making sure the patient can continue to get his Atripla and he continues to be highly compliant with it once he gets out of the skilled nursing facility.

## 2010-08-18 NOTE — Progress Notes (Signed)
Subjective:    Patient ID: Brian Marquez, male    DOB: [birth year], 50 y.o.   MRN: [id number]  HPI 50 year old Caucasian man with HIV/AIDS, prior polysubstance abuse, and homelessness followed previously  by Dr. Orvan Falconer who was admitted to the [facility] service in March  after being found to have progressive confusion and weakness. He underwent LP and had an MRI of the brain, the latter of which showed findings consistent with possible PML. He also had diffuse atrophy that had worsened as well. He was restarted on atripla in house. At that time he was still delirious, frequently sitting in his own urine. He was deemed to be incompetent and arrangements were made for placement in a SNF. Ultimately his cognition improved and he was again felt to be competent by [facility] service though never formally reevaluated by Psychiatry.. However he was agreeable to placement in a skilled nursing facility been staying at the assisted living facility since then. He had successfully dropped his viral down and low down to 20,000 range. He was doing well on Atripla when last seen. He returns to clinic for followup visit today. He would now like to come back out of the skilled nursing facility and lives by himself in a home attention initially in a hotel room. He asked me to declare him to be competent. I told him that I would like him formally evaluated by psychiatry just to make sure they feel that he is competent I think he probably is competent at this point in time. He looks much better today and exam is much more healthy appearing and strong has better color. He seemed fully oriented although he got the month wrong way today in assessment of his orientation he thought it was July rather than June. Apparently he had run out of the Atripla at the facilities that currently we therefore endeavored to get him more Atripla. His appetite is good and we discontinued the gastroc. His CD4 count is well around her we also discontinued his  azithromycin. I spent him to 45 minutes of time with the patient including greater than 50% of time spent in coordination coordination of his care and counseling the patient and the provider came with him today [phone] or [phone]   Review of Systems    as in history of present illness otherwise remainder of 12 point review systems is negative. Objective:   Physical Exam    the patient was alert oriented to person to place to the correct date but wrong month. HEENT: Normocephalic atraumatic pupils equal round react to light sclera anicteric oropharynx clear neck supple cardiovascular exam: Regular rate and rhythm no murmurs cancer is heard lungs clear to auscultation bilaterally without wheezes or rales abdomen soft nondistended nontender positive bowel sounds. Extremities without edema. Neurological exam he has a normal gait his strength and sensation appear intact. Psychiatric patient is seems in good spirits his thought is goal-directed and linear.    Assessment & Plan:  .HIV DISEASE Continue Atripla and PCP prophylaxis. The big issue is making sure the patient can continue to get his Atripla and he continues to be highly compliant with it once he gets out of the skilled nursing facility.  CRYPTOCOCCAL MENINGITIS Continue fluconazole  Encephalopathy May have been a combination of PML and a direct HIV neurotoxicity. He is greatly improved I but hadn't formally seen by psychiatry to see if they concur that he is competent to take care of himself  Polysubstance abuse Hopefully this  is not a common obstacle again to his taking his intervertebral and caring for herself.

## 2010-08-18 NOTE — Assessment & Plan Note (Signed)
Continue fluconazole.

## 2010-08-18 NOTE — Assessment & Plan Note (Signed)
May have been a combination of PML and a direct HIV neurotoxicity. He is greatly improved I but hadn't formally seen by psychiatry to see if they concur that he is competent to take care of himself

## 2010-08-19 LAB — HIV-1 RNA ULTRAQUANT REFLEX TO GENTYP+
HIV 1 RNA Quant: 3450 copies/mL — ABNORMAL HIGH (ref <20)
HIV-1 RNA Quant, Log: 3.54 {Log} — ABNORMAL HIGH (ref <1.30)

## 2010-08-31 LAB — HIV-1 GENOTYPR PLUS

## 2010-09-06 ENCOUNTER — Telehealth: Payer: Self-pay | Admitting: Licensed Clinical Social Worker

## 2010-09-06 NOTE — Telephone Encounter (Signed)
Case manager for Brian Marquez wants to know about forms that need to be filled out for him.

## 2010-09-09 ENCOUNTER — Telehealth: Payer: Self-pay | Admitting: *Deleted

## 2010-09-09 NOTE — Telephone Encounter (Signed)
Rowan Blase who is helping pt with SS called to see if the forms were done yet. Tamika spoke with her. md will likely be in tomorrow & complete this.call her at [phone]

## 2010-09-14 NOTE — Telephone Encounter (Signed)
Brian Marquez, I did not see these forms in my office. Can they resend them and I can fill them out Friday? I will be out of town next week as well

## 2010-09-14 NOTE — Telephone Encounter (Signed)
LM for the person who was helping him get the forms done. Asked that she call back as we need another copy of the forms. Told her md will be here Friday only to complete & sign

## 2010-09-21 NOTE — Telephone Encounter (Signed)
She called again. Will give to Stephannie Li, md's CMA to respond

## 2010-09-22 NOTE — Telephone Encounter (Signed)
Spoke with case manager, forms are filled out and will be signed another physician in the practice. Dr. Daiva Eves is out on Vacation and he will not return until after the deadline.

## 2010-11-02 ENCOUNTER — Ambulatory Visit: Payer: Medicaid Other | Admitting: Infectious Disease

## 2010-11-15 ENCOUNTER — Ambulatory Visit (INDEPENDENT_AMBULATORY_CARE_PROVIDER_SITE_OTHER): Payer: Medicare Other | Admitting: Infectious Disease

## 2010-11-15 ENCOUNTER — Encounter: Payer: Self-pay | Admitting: Infectious Disease

## 2010-11-15 VITALS — BP 113/78 | HR 74 | Temp 97.7°F | Wt 139.5 lb

## 2010-11-15 DIAGNOSIS — B20 Human immunodeficiency virus [HIV] disease: Secondary | ICD-10-CM

## 2010-11-15 DIAGNOSIS — B459 Cryptococcosis, unspecified: Secondary | ICD-10-CM

## 2010-11-15 DIAGNOSIS — G9340 Encephalopathy, unspecified: Secondary | ICD-10-CM

## 2010-11-15 DIAGNOSIS — F17200 Nicotine dependence, unspecified, uncomplicated: Secondary | ICD-10-CM

## 2010-11-15 DIAGNOSIS — Z23 Encounter for immunization: Secondary | ICD-10-CM

## 2010-11-15 NOTE — Assessment & Plan Note (Signed)
His encephalopathy seems to clear with treatment of his HIV again I would like him seen by psychiatrist before he can be deep deemed competent

## 2010-11-15 NOTE — Assessment & Plan Note (Signed)
Counseled to quit 

## 2010-11-15 NOTE — Assessment & Plan Note (Signed)
Recheck viral load and CD4 count continue Atripla

## 2010-11-15 NOTE — Progress Notes (Signed)
Subjective:    Patient ID: Brian Marquez, male    DOB: Aug 12, 1960, 50 y.o.   MRN: [id number]  HPI  50 year old Caucasian man with HIV/AIDS, prior polysubstance abuse, and homelessness followed previously by Dr. Orvan Falconer who was admitted to the [facility] service in March after being found to have progressive confusion and weakness. He underwent LP and had an MRI of the brain, the latter of which showed findings consistent with possible PML. He also had diffuse atrophy that had worsened as well. He was restarted on atripla in house. At that time he was still delirious, frequently sitting in his own urine. He was deemed to be incompetent and arrangements were made for placement in a SNF. Ultimately his cognition improved and he was again felt to be competent by [facility] service though never formally reevaluated by Psychiatry.. However he was agreeable to placement in a skilled nursing facility been staying at the assisted living facility since then. He had successfully dropped his viral down and low down to 20,000 range. He was doing well on Atripla when last seen. He declined to see me in June. At that time he was requesting that I declared incompetent so that he could leave the facility was residing in and go live with a friend of his.I told him that I would like him formally evaluated by psychiatry just to make sure they feel that he is competent I think he probably is competent at this point in time at that time apparently he had also run out of his Atripla and his are low did reflect imperfect control with a viral load in 3000 range. Today he thought returns for followup and is in good spirits. He has good coloration active outside. He's been taking his Atripla along with daily for consult and daily multivitamins. He requests that one of the morning medicines be discontinued I told him that was fine to discontinue the multivitamin but he still needed to be taken fluconazole and also clearly to take his Atripla at  night. He again asked to declared competent and I again reminded him that I want him seen by psychiatrist and I reordered a referral to psychiatry. I offered him to be seen also by one of our case managers to help speed up this process but he refused to be seen by them.  The provider came with him today  [phone] or [phone]   Review of Systems  Constitutional: Negative for fever, chills, diaphoresis, activity change, appetite change, fatigue and unexpected weight change.  HENT: Negative for congestion, sore throat, rhinorrhea, sneezing, trouble swallowing and sinus pressure.   Eyes: Negative for photophobia and visual disturbance.  Respiratory: Negative for cough, chest tightness, shortness of breath, wheezing and stridor.   Cardiovascular: Negative for chest pain, palpitations and leg swelling.  Gastrointestinal: Negative for nausea, vomiting, abdominal pain, diarrhea, constipation, blood in stool, abdominal distention and anal bleeding.  Genitourinary: Negative for dysuria, hematuria, flank pain and difficulty urinating.  Musculoskeletal: Negative for myalgias, back pain, joint swelling, arthralgias and gait problem.  Skin: Negative for color change, pallor, rash and wound.  Neurological: Negative for dizziness, tremors, weakness and light-headedness.  Hematological: Negative for adenopathy. Does not bruise/bleed easily.  Psychiatric/Behavioral: Negative for behavioral problems, confusion, sleep disturbance, dysphoric mood, decreased concentration and agitation.       Objective:   Physical Exam  Constitutional: He is oriented to person, place, and time. He appears well-developed and well-nourished. No distress.  HENT:  Head: Normocephalic and atraumatic.  Mouth/Throat: Oropharynx is clear and moist. No oropharyngeal exudate.  Eyes: Conjunctivae and EOM are normal. Pupils are equal, round, and reactive to light. No scleral icterus.  Neck: Normal range of motion. Neck supple. No  JVD present.  Cardiovascular: Normal rate, regular rhythm and normal heart sounds.  Exam reveals no gallop and no friction rub.   No murmur heard. Pulmonary/Chest: Effort normal and breath sounds normal. No respiratory distress. He has no wheezes. He has no rales. He exhibits no tenderness.  Abdominal: He exhibits no distension and no mass. There is no tenderness. There is no rebound and no guarding.  Musculoskeletal: He exhibits no edema and no tenderness.  Lymphadenopathy:    He has no cervical adenopathy.  Neurological: He is alert and oriented to person, place, and time. He has normal reflexes. He exhibits normal muscle tone. Coordination normal.  Skin: Skin is warm and dry. He is not diaphoretic. No erythema. No pallor.  Psychiatric: He has a normal mood and affect. His behavior is normal. Judgment and thought content normal.          Assessment & Plan:  HIV DISEASE Recheck viral load and CD4 count continue Atripla  CRYPTOCOCCAL MENINGITIS Continue fluconazole  [employer] Counseled to quit  Encephalopathy His encephalopathy seems to clear with treatment of his HIV again I would like him seen by psychiatrist before he can be deep deemed competent

## 2010-11-15 NOTE — Assessment & Plan Note (Signed)
Continue fluconazole.

## 2010-11-16 LAB — CBC WITH DIFFERENTIAL/PLATELET
HCT: 40.8 % (ref 39.0–52.0)
Hemoglobin: 13.9 g/dL (ref 13.0–17.0)
Lymphocytes Relative: 21 % (ref 12–46)
Lymphs Abs: 1.1 10*3/uL (ref 0.7–4.0)
Monocytes Absolute: 0.3 10*3/uL (ref 0.1–1.0)
Monocytes Relative: 6 % (ref 3–12)
Neutro Abs: 3.6 10*3/uL (ref 1.7–7.7)
RBC: 3.98 MIL/uL — ABNORMAL LOW (ref 4.22–5.81)
WBC: 5.3 10*3/uL (ref 4.0–10.5)

## 2010-11-16 LAB — COMPLETE METABOLIC PANEL WITH GFR
ALT: 20 U/L (ref 0–53)
AST: 25 U/L (ref 0–37)
CO2: 26 mEq/L (ref 19–32)
Calcium: 9.6 mg/dL (ref 8.4–10.5)
Chloride: 101 mEq/L (ref 96–112)
GFR, Est African American: >60 mL/min (ref >60)
Sodium: 134 mEq/L — ABNORMAL LOW (ref 135–145)
Total Protein: 7.4 g/dL (ref 6.0–8.3)

## 2010-11-16 LAB — HIV-1 RNA ULTRAQUANT REFLEX TO GENTYP+: HIV-1 RNA Quant, Log: 2.04 {Log} — ABNORMAL HIGH (ref <1.30)

## 2010-11-16 LAB — T-HELPER CELL (CD4) - (RCID CLINIC ONLY): CD4 T Cell Abs: 170 uL — ABNORMAL LOW (ref 400–2700)

## 2010-12-15 ENCOUNTER — Encounter: Payer: Self-pay | Admitting: Infectious Disease

## 2010-12-15 ENCOUNTER — Ambulatory Visit (INDEPENDENT_AMBULATORY_CARE_PROVIDER_SITE_OTHER): Payer: Medicare Other | Admitting: Infectious Disease

## 2010-12-15 VITALS — BP 114/79 | HR 80 | Temp 98.4°F | Wt 137.0 lb

## 2010-12-15 DIAGNOSIS — G9340 Encephalopathy, unspecified: Secondary | ICD-10-CM

## 2010-12-15 DIAGNOSIS — Z113 Encounter for screening for infections with a predominantly sexual mode of transmission: Secondary | ICD-10-CM

## 2010-12-15 DIAGNOSIS — B459 Cryptococcosis, unspecified: Secondary | ICD-10-CM

## 2010-12-15 DIAGNOSIS — B20 Human immunodeficiency virus [HIV] disease: Secondary | ICD-10-CM

## 2010-12-15 DIAGNOSIS — A812 Progressive multifocal leukoencephalopathy: Secondary | ICD-10-CM

## 2010-12-15 NOTE — Assessment & Plan Note (Signed)
Continue fluconazole.

## 2010-12-15 NOTE — Assessment & Plan Note (Signed)
Check viral load, cd4, continue the bactrim, tried fluconazole

## 2010-12-15 NOTE — Assessment & Plan Note (Signed)
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 He need to be seen for clearance from Psych if he is to leave his assisted living.

## 2010-12-15 NOTE — Assessment & Plan Note (Signed)
Not clear if this was PML or HIV direct neurotoxicity

## 2010-12-15 NOTE — Progress Notes (Signed)
Subjective:    Patient ID: Brian Marquez, male    DOB: January 08, 1961, 50 y.o.   MRN: [id number]  HPI  Subjective:   Patient ID: Brian Marquez, male DOB: 1961-02-02, 50 y.o. MRN: [id number]  50 year old Caucasian man with HIV/AIDS, prior polysubstance abuse, and homelessness followed previously by Dr. Orvan Falconer who was admitted to the [facility] service in March after being found to have progressive confusion and weakness. He underwent LP and had an MRI of the brain, the latter of which showed findings consistent with possible PML. He also had diffuse atrophy that had worsened as well. He was restarted on atripla in house. At that time he was still delirious, frequently sitting in his own urine. He was deemed to be incompetent and arrangements were made for placement in a SNF. Ultimately his cognition improved and he was again felt to be competent by [facility] service though never formally reevaluated by Psychiatry.. However he was agreeable to placement in a skilled nursing facility been staying at the assisted living facility since then. He had successfully dropped his viral down and low down to 20,000 range. He was doing well on Atripla when last seen. He declined to see me in June. At that time he was requesting that I declared competent so that he could leave the facility was residing in and go live with a friend of his. He has still not been assessed by psychiatry. He is in good spirits. He refused a flu vaccine yet again today.  Review of Systems  Constitutional: Negative for fever, chills, diaphoresis, activity change, appetite change, fatigue and unexpected weight change.  HENT: Negative for congestion, sore throat, rhinorrhea, sneezing, trouble swallowing and sinus pressure.   Eyes: Negative for photophobia and visual disturbance.  Respiratory: Negative for cough, chest tightness, shortness of breath, wheezing and stridor.   Cardiovascular: Negative for chest pain, palpitations and leg swelling.    Gastrointestinal: Negative for nausea, vomiting, abdominal pain, diarrhea, constipation, blood in stool, abdominal distention and anal bleeding.  Genitourinary: Negative for dysuria, hematuria, flank pain and difficulty urinating.  Musculoskeletal: Negative for myalgias, back pain, joint swelling, arthralgias and gait problem.  Skin: Negative for color change, pallor, rash and wound.  Neurological: Negative for dizziness, tremors, weakness and light-headedness.  Hematological: Negative for adenopathy. Does not bruise/bleed easily.  Psychiatric/Behavioral: Negative for behavioral problems, confusion, sleep disturbance, dysphoric mood, decreased concentration and agitation.       Objective:   Physical Exam  Constitutional: He is oriented to person, place, and time. He appears well-developed and well-nourished. No distress.  HENT:  Head: Normocephalic and atraumatic.  Mouth/Throat: Oropharynx is clear and moist. No oropharyngeal exudate.  Eyes: Conjunctivae and EOM are normal. Pupils are equal, round, and reactive to light. No scleral icterus.  Neck: Normal range of motion. Neck supple. No JVD present.  Cardiovascular: Normal rate, regular rhythm and normal heart sounds.  Exam reveals no gallop and no friction rub.   No murmur heard. Pulmonary/Chest: Effort normal and breath sounds normal. No respiratory distress. He has no wheezes. He has no rales. He exhibits no tenderness.  Abdominal: He exhibits no distension and no mass. There is no tenderness. There is no rebound and no guarding.  Musculoskeletal: He exhibits no edema and no tenderness.  Lymphadenopathy:    He has no cervical adenopathy.  Neurological: He is alert and oriented to person, place, and time. He has normal reflexes. He exhibits normal muscle tone. Coordination normal.  Skin: Skin is warm  and dry. He is not diaphoretic. No erythema. No pallor.  Psychiatric: He has a normal mood and affect. His behavior is normal. Judgment  and thought content normal.          Assessment & Plan:  HIV DISEASE Check viral load, cd4, continue the bactrim, tried fluconazole  CRYPTOCOCCAL MENINGITIS Continue fluconazole  PML (progressive multifocal leukoencephalopathy) Not clear if this was PML or HIV direct neurotoxicity  Encephalopathy HIV encephalopathy vs JC virus mediated pathology,  Improved dramatically. He need to be seen for clearance from Psych if he is to leave his assisted living.

## 2010-12-16 LAB — GC/CHLAMYDIA PROBE AMP, URINE
Chlamydia, Swab/Urine, PCR: NEGATIVE
GC Probe Amp, Urine: NEGATIVE

## 2010-12-16 LAB — COMPLETE METABOLIC PANEL WITH GFR
AST: 25 U/L (ref 0–37)
Albumin: 4.1 g/dL (ref 3.5–5.2)
Alkaline Phosphatase: 91 U/L (ref 39–117)
BUN: 17 mg/dL (ref 6–23)
Potassium: 4.8 mEq/L (ref 3.5–5.3)
Total Bilirubin: 0.2 mg/dL — ABNORMAL LOW (ref 0.3–1.2)

## 2010-12-16 LAB — CBC WITH DIFFERENTIAL/PLATELET
Basophils Absolute: 0 10*3/uL (ref 0.0–0.1)
Eosinophils Absolute: 0.1 10*3/uL (ref 0.0–0.7)
Lymphocytes Relative: 17 % (ref 12–46)
Lymphs Abs: 0.9 10*3/uL (ref 0.7–4.0)
Neutrophils Relative %: 72 % (ref 43–77)
Platelets: 206 10*3/uL (ref 150–400)
RBC: 4.23 MIL/uL (ref 4.22–5.81)
WBC: 5.2 10*3/uL (ref 4.0–10.5)

## 2010-12-23 LAB — HIV-1 RNA QUANT-NO REFLEX-BLD: HIV 1 RNA Quant: 116 copies/mL — ABNORMAL HIGH (ref <20)

## 2011-01-24 ENCOUNTER — Encounter: Payer: Self-pay | Admitting: Infectious Disease

## 2011-01-24 ENCOUNTER — Ambulatory Visit (INDEPENDENT_AMBULATORY_CARE_PROVIDER_SITE_OTHER): Payer: Medicare Other | Admitting: Infectious Disease

## 2011-01-24 VITALS — BP 117/79 | HR 74 | Temp 97.8°F | Wt 140.0 lb

## 2011-01-24 DIAGNOSIS — G9340 Encephalopathy, unspecified: Secondary | ICD-10-CM

## 2011-01-24 DIAGNOSIS — F17200 Nicotine dependence, unspecified, uncomplicated: Secondary | ICD-10-CM

## 2011-01-24 DIAGNOSIS — B20 Human immunodeficiency virus [HIV] disease: Secondary | ICD-10-CM

## 2011-01-24 DIAGNOSIS — N289 Disorder of kidney and ureter, unspecified: Secondary | ICD-10-CM | POA: Insufficient documentation

## 2011-01-24 LAB — CBC WITH DIFFERENTIAL/PLATELET
Basophils Absolute: 0 10*3/uL (ref 0.0–0.1)
Basophils Relative: 1 % (ref 0–1)
Eosinophils Relative: 4 % (ref 0–5)
HCT: 44.6 % (ref 39.0–52.0)
Lymphocytes Relative: 20 % (ref 12–46)
MCHC: 33.6 g/dL (ref 30.0–36.0)
Monocytes Absolute: 0.4 10*3/uL (ref 0.1–1.0)
Neutro Abs: 3.5 10*3/uL (ref 1.7–7.7)
Platelets: 211 10*3/uL (ref 150–400)
RDW: 13 % (ref 11.5–15.5)
WBC: 5.1 10*3/uL (ref 4.0–10.5)

## 2011-01-24 LAB — COMPLETE METABOLIC PANEL WITH GFR
Albumin: 4.4 g/dL (ref 3.5–5.2)
Alkaline Phosphatase: 88 U/L (ref 39–117)
BUN: 20 mg/dL (ref 6–23)
CO2: 23 mEq/L (ref 19–32)
Calcium: 9.8 mg/dL (ref 8.4–10.5)
Chloride: 103 mEq/L (ref 96–112)
GFR, Est Non African American: 54 mL/min — ABNORMAL LOW
Glucose, Bld: 84 mg/dL (ref 70–99)
Potassium: 4.3 mEq/L (ref 3.5–5.3)
Sodium: 138 mEq/L (ref 135–145)
Total Protein: 7.6 g/dL (ref 6.0–8.3)

## 2011-01-24 NOTE — Progress Notes (Signed)
Subjective:    Patient ID: Brian Marquez, male    DOB: [birth year], 50 y.o.   MRN: [id number]  HPI  50 year old Caucasian man with HIV/AIDS, prior polysubstance abuse, and homelessness followed previously by Dr. Orvan Falconer who was admitted to the [facility] service in March 2012 after being found to have progressive confusion and weakness. He underwent LP and had an MRI of the brain, the latter of which showed findings consistent with possible PML. He also had diffuse atrophy that had worsened as well. He was restarted on atripla in house. At that time he was still delirious, frequently sitting in his own urine. He was deemed to be incompetent and arrangements were made for placement in a SNF. Ultimately his cognition improved and he was again felt to be competent by [facility] service though never formally reevaluated by Psychiatry.. However he was agreeable to placement in a skilled nursing facility been staying at the assisted living facility since then. His most recent viral loads have been in the low 100s and cd4 count most recently was 180. He has been seen by psychiatry as part of a 2 part full asssesment of competency. He has no complaints today. He is feeling well. He refused flu shot again. WHen asked about future attempts to help him with smoking cessation he also laughed.  Review of Systems  Constitutional: Negative for fever, chills, diaphoresis, activity change, appetite change, fatigue and unexpected weight change.  HENT: Negative for congestion, sore throat, rhinorrhea, sneezing, trouble swallowing and sinus pressure.   Eyes: Negative for photophobia and visual disturbance.  Respiratory: Negative for cough, chest tightness, shortness of breath, wheezing and stridor.   Cardiovascular: Negative for chest pain, palpitations and leg swelling.  Gastrointestinal: Negative for nausea, vomiting, abdominal pain, diarrhea, constipation, blood in stool, abdominal distention and anal bleeding.  Genitourinary:  Negative for dysuria, hematuria, flank pain and difficulty urinating.  Musculoskeletal: Negative for myalgias, back pain, joint swelling, arthralgias and gait problem.  Skin: Negative for color change, pallor, rash and wound.  Neurological: Negative for dizziness, tremors, weakness and light-headedness.  Hematological: Negative for adenopathy. Does not bruise/bleed easily.  Psychiatric/Behavioral: Negative for behavioral problems, confusion, sleep disturbance, dysphoric mood, decreased concentration and agitation.       Objective:   Physical Exam  Constitutional: He is oriented to person, place, and time. He appears well-developed and well-nourished. No distress.  HENT:  Head: Normocephalic and atraumatic.  Mouth/Throat: Oropharynx is clear and moist. No oropharyngeal exudate.  Eyes: Conjunctivae and EOM are normal. Pupils are equal, round, and reactive to light. No scleral icterus.  Neck: Normal range of motion. Neck supple. No JVD present.  Cardiovascular: Normal rate, regular rhythm and normal heart sounds.  Exam reveals no gallop and no friction rub.   No murmur heard. Pulmonary/Chest: Effort normal and breath sounds normal. No respiratory distress. He has no wheezes. He has no rales. He exhibits no tenderness.  Abdominal: He exhibits no distension and no mass. There is no tenderness. There is no rebound and no guarding.  Musculoskeletal: He exhibits no edema and no tenderness.  Lymphadenopathy:    He has no cervical adenopathy.  Neurological: He is alert and oriented to person, place, and time. He has normal reflexes. He exhibits normal muscle tone. Coordination normal.  Skin: Skin is warm and dry. He is not diaphoretic. No erythema. No pallor.  Psychiatric: He has a normal mood and affect. His behavior is normal. Judgment and thought content normal.  Assessment & Plan:  HIV DISEASE Continue atripla. Virological suppression has not been perfect but with viral load in  the 100s I am NOT YET concerned about resistance  CIGARETTE SMOKER Will cotninue to counsel to stop smoking  Renal insufficiency Cr was up at last check, recheck this visit with microalbumin to creatinine ration and UA  Encephalopathy Has resolved. Competency being assessed

## 2011-01-24 NOTE — Assessment & Plan Note (Signed)
Will cotninue to counsel to stop smoking

## 2011-01-24 NOTE — Assessment & Plan Note (Signed)
Has resolved. Competency being assessed

## 2011-01-24 NOTE — Assessment & Plan Note (Signed)
Cr was up at last check, recheck this visit with microalbumin to creatinine ration and UA

## 2011-01-24 NOTE — Assessment & Plan Note (Signed)
Continue atripla. Virological suppression has not been perfect but with viral load in the 100s I am NOT YET concerned about resistance

## 2011-01-25 LAB — T-HELPER CELL (CD4) - (RCID CLINIC ONLY)
CD4 % Helper T Cell: 13 % — ABNORMAL LOW (ref 33–55)
CD4 T Cell Abs: 140 uL — ABNORMAL LOW (ref 400–2700)

## 2011-01-26 LAB — HIV-1 RNA QUANT-NO REFLEX-BLD: HIV-1 RNA Quant, Log: 1.95 {Log} — ABNORMAL HIGH (ref <1.30)

## 2011-03-16 ENCOUNTER — Encounter: Payer: Self-pay | Admitting: Infectious Disease

## 2011-03-16 ENCOUNTER — Ambulatory Visit (INDEPENDENT_AMBULATORY_CARE_PROVIDER_SITE_OTHER): Payer: Medicare Other | Admitting: Infectious Disease

## 2011-03-16 VITALS — BP 116/83 | HR 81 | Temp 97.6°F | Ht 68.0 in | Wt 143.0 lb

## 2011-03-16 DIAGNOSIS — F17200 Nicotine dependence, unspecified, uncomplicated: Secondary | ICD-10-CM

## 2011-03-16 DIAGNOSIS — G9340 Encephalopathy, unspecified: Secondary | ICD-10-CM

## 2011-03-16 DIAGNOSIS — B20 Human immunodeficiency virus [HIV] disease: Secondary | ICD-10-CM

## 2011-03-16 DIAGNOSIS — A812 Progressive multifocal leukoencephalopathy: Secondary | ICD-10-CM

## 2011-03-16 LAB — COMPLETE METABOLIC PANEL WITH GFR
ALT: 15 U/L (ref 0–53)
Albumin: 4.4 g/dL (ref 3.5–5.2)
Alkaline Phosphatase: 87 U/L (ref 39–117)
Potassium: 4.3 mEq/L (ref 3.5–5.3)
Sodium: 135 mEq/L (ref 135–145)
Total Bilirubin: 0.3 mg/dL (ref 0.3–1.2)
Total Protein: 7.6 g/dL (ref 6.0–8.3)

## 2011-03-16 LAB — CBC WITH DIFFERENTIAL/PLATELET
Basophils Absolute: 0 10*3/uL (ref 0.0–0.1)
Basophils Relative: 1 % (ref 0–1)
Lymphocytes Relative: 19 % (ref 12–46)
MCHC: 34 g/dL (ref 30.0–36.0)
Monocytes Absolute: 0.3 10*3/uL (ref 0.1–1.0)
Neutro Abs: 4.4 10*3/uL (ref 1.7–7.7)
Neutrophils Relative %: 73 % (ref 43–77)
Platelets: 211 10*3/uL (ref 150–400)
RDW: 13.9 % (ref 11.5–15.5)
WBC: 6 10*3/uL (ref 4.0–10.5)

## 2011-03-16 NOTE — Assessment & Plan Note (Signed)
Still smoking

## 2011-03-16 NOTE — Progress Notes (Signed)
Subjective:    Patient ID: Brian Marquez, male    DOB: [birth year], 51 y.o.   MRN: [id number]  HPI  51 year old Caucasian man with HIV/AIDS, prior polysubstance abuse, and homelessness followed previously by Dr. Orvan Falconer who was admitted to the [facility] service in March 2012 after being found to have progressive confusion and weakness. He underwent LP and had an MRI of the brain, the latter of which showed findings consistent with possible PML. He also had diffuse atrophy that had worsened as well. He was restarted on atripla in house. At that time he was still delirious, frequently sitting in his own urine. He was deemed to be incompetent and arrangements were made for placement in a SNF. Ultimately his cognition improved and he was again felt to be competent by [facility] service though never formally reevaluated by Psychiatry during that stay.Marland Kitchen However he was agreeable to placement in a skilled nursing facility been staying at the assisted living facility since then. He now has completed his two part evaluation by psychiatry by Dr.  Langston Masker in  [place] and per the patient has been deemed competent by him. He has decided however in the interim to stay in the assisted living. HE is claiming adherent to his atripla. He is eager to come off of fluconazole and bactrim when and if his cd4 count comes up above 200. He refused flu shot yet again today.   Review of Systems  Constitutional: Negative for fever, chills, diaphoresis, activity change, appetite change, fatigue and unexpected weight change.  HENT: Negative for congestion, sore throat, rhinorrhea, sneezing, trouble swallowing and sinus pressure.   Eyes: Negative for photophobia and visual disturbance.  Respiratory: Negative for cough, chest tightness, shortness of breath, wheezing and stridor.   Cardiovascular: Negative for chest pain, palpitations and leg swelling.  Gastrointestinal: Negative for nausea, vomiting, abdominal pain, diarrhea, constipation,  blood in stool, abdominal distention and anal bleeding.  Genitourinary: Negative for dysuria, hematuria, flank pain and difficulty urinating.  Musculoskeletal: Negative for myalgias, back pain, joint swelling, arthralgias and gait problem.  Skin: Negative for color change, pallor, rash and wound.  Neurological: Negative for dizziness, tremors, weakness and light-headedness.  Hematological: Negative for adenopathy. Does not bruise/bleed easily.  Psychiatric/Behavioral: Negative for behavioral problems, confusion, sleep disturbance, dysphoric mood, decreased concentration and agitation.       Objective:   Physical Exam  Constitutional: He is oriented to person, place, and time. He appears well-developed and well-nourished. No distress.  HENT:  Head: Normocephalic and atraumatic.  Mouth/Throat: Oropharynx is clear and moist. No oropharyngeal exudate.  Eyes: Conjunctivae and EOM are normal. Pupils are equal, round, and reactive to light. No scleral icterus.  Neck: Normal range of motion. Neck supple. No JVD present.  Cardiovascular: Normal rate, regular rhythm and normal heart sounds.  Exam reveals no gallop and no friction rub.   No murmur heard. Pulmonary/Chest: Effort normal and breath sounds normal. No respiratory distress. He has no wheezes. He has no rales. He exhibits no tenderness.  Abdominal: He exhibits no distension and no mass. There is no tenderness. There is no rebound and no guarding.  Musculoskeletal: He exhibits no edema and no tenderness.  Lymphadenopathy:    He has no cervical adenopathy.  Neurological: He is alert and oriented to person, place, and time. He has normal reflexes. He exhibits normal muscle tone. Coordination normal.  Skin: Skin is warm and dry. He is not diaphoretic. No erythema. No pallor.  Psychiatric: He has a normal  mood and affect. His behavior is normal. Judgment and thought content normal.          Assessment & Plan:  HIV DISEASE Recheck viral  load and cd4, continue PCP prophylaxis  CIGARETTE SMOKER Still smoking  Encephalopathy Seems resolved. I do worry about him when he comes out of a structured environment  PML (progressive multifocal leukoencephalopathy) Appears resolved

## 2011-03-16 NOTE — Assessment & Plan Note (Signed)
Seems resolved. I do worry about him when he comes out of a structured environment

## 2011-03-16 NOTE — Assessment & Plan Note (Signed)
Recheck viral load and cd4, continue PCP prophylaxis

## 2011-03-16 NOTE — Assessment & Plan Note (Signed)
Appears resolved

## 2011-03-17 LAB — T-HELPER CELL (CD4) - (RCID CLINIC ONLY): CD4 % Helper T Cell: 14 % — ABNORMAL LOW (ref 33–55)

## 2011-03-18 LAB — HIV-1 RNA ULTRAQUANT REFLEX TO GENTYP+: HIV-1 RNA Quant, Log: 2.23 {Log} — ABNORMAL HIGH (ref <1.30)

## 2011-05-16 ENCOUNTER — Encounter: Payer: Self-pay | Admitting: Infectious Disease

## 2011-05-16 ENCOUNTER — Other Ambulatory Visit (HOSPITAL_COMMUNITY)
Admission: RE | Admit: 2011-05-16 | Discharge: 2011-05-16 | Disposition: A | Discharge status: Home / Self Care | Payer: Medicare Other | Source: Ambulatory Visit | Attending: Infectious Disease | Admitting: Infectious Disease

## 2011-05-16 ENCOUNTER — Ambulatory Visit (INDEPENDENT_AMBULATORY_CARE_PROVIDER_SITE_OTHER): Payer: Medicare Other | Admitting: Infectious Disease

## 2011-05-16 VITALS — BP 113/71 | HR 69 | Temp 97.6°F | Ht 68.0 in | Wt 139.2 lb

## 2011-05-16 DIAGNOSIS — B20 Human immunodeficiency virus [HIV] disease: Secondary | ICD-10-CM

## 2011-05-16 DIAGNOSIS — N289 Disorder of kidney and ureter, unspecified: Secondary | ICD-10-CM

## 2011-05-16 DIAGNOSIS — Z113 Encounter for screening for infections with a predominantly sexual mode of transmission: Secondary | ICD-10-CM | POA: Insufficient documentation

## 2011-05-16 DIAGNOSIS — F17200 Nicotine dependence, unspecified, uncomplicated: Secondary | ICD-10-CM

## 2011-05-16 DIAGNOSIS — E785 Hyperlipidemia, unspecified: Secondary | ICD-10-CM

## 2011-05-16 DIAGNOSIS — B459 Cryptococcosis, unspecified: Secondary | ICD-10-CM

## 2011-05-16 LAB — URINALYSIS, ROUTINE W REFLEX MICROSCOPIC
Glucose, UA: NEGATIVE mg/dL
Leukocytes, UA: NEGATIVE
Protein, ur: NEGATIVE mg/dL
Specific Gravity, Urine: 1.008 (ref 1.005–1.030)
pH: 6.5 (ref 5.0–8.0)

## 2011-05-16 LAB — COMPLETE METABOLIC PANEL WITH GFR
AST: 23 U/L (ref 0–37)
Albumin: 4.9 g/dL (ref 3.5–5.2)
Alkaline Phosphatase: 100 U/L (ref 39–117)
BUN: 19 mg/dL (ref 6–23)
Calcium: 9.4 mg/dL (ref 8.4–10.5)
Chloride: 107 mEq/L (ref 96–112)
Glucose, Bld: 94 mg/dL (ref 70–99)
Potassium: 4.4 mEq/L (ref 3.5–5.3)
Sodium: 140 mEq/L (ref 135–145)
Total Protein: 7.6 g/dL (ref 6.0–8.3)

## 2011-05-16 LAB — CBC WITH DIFFERENTIAL/PLATELET
Basophils Relative: 1 % (ref 0–1)
Eosinophils Absolute: 0.1 10*3/uL (ref 0.0–0.7)
Eosinophils Relative: 3 % (ref 0–5)
Hemoglobin: 14.9 g/dL (ref 13.0–17.0)
Lymphs Abs: 0.9 10*3/uL (ref 0.7–4.0)
MCH: 33.6 pg (ref 26.0–34.0)
MCHC: 33.4 g/dL (ref 30.0–36.0)
MCV: 100.7 fL — ABNORMAL HIGH (ref 78.0–100.0)
Monocytes Absolute: 0.4 10*3/uL (ref 0.1–1.0)
Monocytes Relative: 7 % (ref 3–12)
RBC: 4.43 MIL/uL (ref 4.22–5.81)

## 2011-05-16 LAB — MICROALBUMIN / CREATININE URINE RATIO
Creatinine, Urine: 33.6 mg/dL
Microalb Creat Ratio: 31.3 mg/g — ABNORMAL HIGH (ref 0.0–30.0)
Microalb, Ur: 1.05 mg/dL (ref 0.00–1.89)

## 2011-05-16 LAB — LIPID PANEL
Cholesterol: 225 mg/dL — ABNORMAL HIGH (ref 0–200)
Total CHOL/HDL Ratio: 5.9 Ratio
VLDL: 65 mg/dL — ABNORMAL HIGH (ref 0–40)

## 2011-05-16 LAB — SODIUM, URINE, RANDOM: Sodium, Ur: 37 mEq/L

## 2011-05-16 NOTE — Assessment & Plan Note (Signed)
Recheck HV VL CD4 today. Continue bactrim for OI prophylaxis though I have told him if he can maintain consistent UD VL we could stop the bactrim with cd4 count in current ballpark.

## 2011-05-16 NOTE — Assessment & Plan Note (Signed)
counselled to stop again

## 2011-05-16 NOTE — Progress Notes (Signed)
Subjective:    Patient ID: Brian Marquez, male    DOB: 1960-05-20, 51 y.o.   MRN: [id number]  HPI  51 year old Caucasian man with HIV/AIDS, prior polysubstance abuse, and homelessness followed previously by Dr. Orvan Falconer who was admitted to the [facility] service in March 2012 after being found to have progressive confusion and weakness. He underwent LP and had an MRI of the brain, the latter of which showed findings consistent with possible PML. He also had diffuse atrophy that had worsened as well. He was restarted on atripla in house. At that time he was still delirious, frequently sitting in his own urine. He was deemed to be incompetent and arrangements were made for placement in a SNF. Ultimately his cognition improved and he was again felt to be competent by [facility] service though never formally reevaluated by Psychiatry during that stay.Marland Kitchen However he was agreeable to placement in a skilled nursing facility been staying at the assisted living facility since then. He had  completed his two part evaluation by psychiatry by Dr. Langston Masker in [place] and per the patient has been deemed competent by him. He has decided however in the interim to stay in the assisted living. HE is claiming adherent to his atripla. algthough virological supppression is NOT  Perfect.  He is eager to come off of fluconazole and bactrim.  Review of Systems  Constitutional: Negative for fever, chills, diaphoresis, activity change, appetite change, fatigue and unexpected weight change.  HENT: Negative for congestion, sore throat, rhinorrhea, sneezing, trouble swallowing and sinus pressure.   Eyes: Negative for photophobia and visual disturbance.  Respiratory: Negative for cough, chest tightness, shortness of breath, wheezing and stridor.   Cardiovascular: Negative for chest pain, palpitations and leg swelling.  Gastrointestinal: Negative for nausea, vomiting, abdominal pain, diarrhea, constipation, blood in stool, abdominal distention and  anal bleeding.  Genitourinary: Negative for dysuria, hematuria, flank pain and difficulty urinating.  Musculoskeletal: Negative for myalgias, back pain, joint swelling, arthralgias and gait problem.  Skin: Negative for color change, pallor, rash and wound.  Neurological: Negative for dizziness, tremors, weakness and light-headedness.  Hematological: Negative for adenopathy. Does not bruise/bleed easily.  Psychiatric/Behavioral: Negative for behavioral problems, confusion, sleep disturbance, dysphoric mood, decreased concentration and agitation.       Objective:   Physical Exam  Constitutional: He is oriented to person, place, and time. He appears well-developed and well-nourished. No distress.  HENT:  Head: Normocephalic and atraumatic.  Mouth/Throat: Oropharynx is clear and moist. No oropharyngeal exudate.  Eyes: Conjunctivae and EOM are normal. Pupils are equal, round, and reactive to light. No scleral icterus.  Neck: Normal range of motion. Neck supple. No JVD present.  Cardiovascular: Normal rate, regular rhythm and normal heart sounds.  Exam reveals no gallop and no friction rub.   No murmur heard. Pulmonary/Chest: Effort normal and breath sounds normal. No respiratory distress. He has no wheezes. He has no rales. He exhibits no tenderness.  Abdominal: He exhibits no distension and no mass. There is no tenderness. There is no rebound and no guarding.  Musculoskeletal: He exhibits no edema and no tenderness.  Lymphadenopathy:    He has no cervical adenopathy.  Neurological: He is alert and oriented to person, place, and time. He has normal reflexes. He exhibits normal muscle tone. Coordination normal.  Skin: Skin is warm and dry. He is not diaphoretic. No erythema. No pallor.  Psychiatric: He has a normal mood and affect. His behavior is normal. Judgment and thought content normal.  Assessment & Plan:  HIV DISEASE Recheck HV VL CD4 today. Continue bactrim for OI  prophylaxis though I have told him if he can maintain consistent UD VL we could stop the bactrim with cd4 count in current ballpark.  CRYPTOCOCCAL MENINGITIS Given CD4 above 100 will be ok to dc the fluconazole after he has finished one year of it  Renal insufficiency Rechecking urine and blood labs today. May need to change hi ARV to non TNF regimen  CIGARETTE SMOKER counselled to stop again

## 2011-05-16 NOTE — Assessment & Plan Note (Signed)
Rechecking urine and blood labs today. May need to change hi ARV to non TNF regimen

## 2011-05-16 NOTE — Assessment & Plan Note (Signed)
Given CD4 above 100 will be ok to dc the fluconazole after he has finished one year of it

## 2011-05-24 ENCOUNTER — Other Ambulatory Visit: Payer: Self-pay | Admitting: *Deleted

## 2011-05-24 DIAGNOSIS — B20 Human immunodeficiency virus [HIV] disease: Secondary | ICD-10-CM

## 2011-07-08 ENCOUNTER — Other Ambulatory Visit: Payer: Self-pay | Admitting: Infectious Disease

## 2011-07-08 DIAGNOSIS — B20 Human immunodeficiency virus [HIV] disease: Secondary | ICD-10-CM

## 2011-07-11 ENCOUNTER — Other Ambulatory Visit: Payer: Self-pay | Admitting: Infectious Disease

## 2011-07-11 DIAGNOSIS — B20 Human immunodeficiency virus [HIV] disease: Secondary | ICD-10-CM

## 2011-07-18 ENCOUNTER — Ambulatory Visit (INDEPENDENT_AMBULATORY_CARE_PROVIDER_SITE_OTHER): Payer: Medicare Other | Admitting: Infectious Disease

## 2011-07-18 ENCOUNTER — Encounter: Payer: Self-pay | Admitting: Infectious Disease

## 2011-07-18 VITALS — BP 109/70 | HR 73 | Temp 98.0°F | Wt 133.0 lb

## 2011-07-18 DIAGNOSIS — N289 Disorder of kidney and ureter, unspecified: Secondary | ICD-10-CM

## 2011-07-18 DIAGNOSIS — Z79899 Other long term (current) drug therapy: Secondary | ICD-10-CM | POA: Diagnosis not present

## 2011-07-18 DIAGNOSIS — B459 Cryptococcosis, unspecified: Secondary | ICD-10-CM

## 2011-07-18 DIAGNOSIS — B20 Human immunodeficiency virus [HIV] disease: Secondary | ICD-10-CM

## 2011-07-18 DIAGNOSIS — Z113 Encounter for screening for infections with a predominantly sexual mode of transmission: Secondary | ICD-10-CM

## 2011-07-18 DIAGNOSIS — G9340 Encephalopathy, unspecified: Secondary | ICD-10-CM

## 2011-07-18 NOTE — Assessment & Plan Note (Signed)
resolved 

## 2011-07-18 NOTE — Assessment & Plan Note (Signed)
Improved at last visit. HLA [id number] negative and can change to epzicom with sustiva if he has problems with this again--though he really just wants one pill once a day

## 2011-07-18 NOTE — Progress Notes (Signed)
Subjective:    Patient ID: Brian Marquez, male    DOB: 09-26-1960, 50 y.o.   MRN: [id number]  HPI  51 year old Caucasian man with HIV/AIDS, prior polysubstance abuse, and homelessness followed previously by Dr. Orvan Falconer who was admitted to the [facility] service in March 2012 after being found to have progressive confusion and weakness. He underwent LP and had an MRI of the brain, the latter of which showed findings consistent with possible PML. He also had diffuse atrophy that had worsened as well. He was restarted on atripla in house. At that time he was still delirious, frequently sitting in his own urine. He was deemed to be incompetent and arrangements were made for placement in a SNF. Ultimately his cognition improved and he was again felt to be competent by [facility] service though never formally reevaluated by Psychiatry during that stay.Marland Kitchen However he was agreeable to placement in a skilled nursing facility been staying at the assisted living facility since then. He had completed his two part evaluation by psychiatry by Dr. Langston Masker in [place] and per the patient has been deemed competent by him. He has decided however in the interim to stay in the assisted living. HE is claiming adherent to his atripla. algthough virological supppression is NOT Perfect. He continues to live in assisted living facility. We reviewed labs from March and I am comfortable dcing his fluconazole and bactrim. Forms for care plan were filled out at this visit as well.   Review of Systems  Constitutional: Negative for fever, chills, diaphoresis, activity change, appetite change, fatigue and unexpected weight change.  HENT: Negative for congestion, sore throat, rhinorrhea, sneezing, trouble swallowing and sinus pressure.   Eyes: Negative for photophobia and visual disturbance.  Respiratory: Negative for cough, chest tightness, shortness of breath, wheezing and stridor.   Cardiovascular: Negative for chest pain, palpitations and leg  swelling.  Gastrointestinal: Negative for nausea, vomiting, abdominal pain, diarrhea, constipation, blood in stool, abdominal distention and anal bleeding.  Genitourinary: Negative for dysuria, hematuria, flank pain and difficulty urinating.  Musculoskeletal: Negative for myalgias, back pain, joint swelling, arthralgias and gait problem.  Skin: Positive for rash. Negative for color change, pallor and wound.  Neurological: Negative for dizziness, tremors, weakness and light-headedness.  Hematological: Negative for adenopathy. Does not bruise/bleed easily.  Psychiatric/Behavioral: Negative for behavioral problems, confusion, sleep disturbance, dysphoric mood, decreased concentration and agitation.       Objective:   Physical Exam  Constitutional: He is oriented to person, place, and time. He appears well-developed and well-nourished. No distress.  HENT:  Head: Normocephalic and atraumatic.  Mouth/Throat: Oropharynx is clear and moist. No oropharyngeal exudate.  Eyes: Conjunctivae and EOM are normal. Pupils are equal, round, and reactive to light. No scleral icterus.  Neck: Normal range of motion. Neck supple. No JVD present.  Cardiovascular: Normal rate, regular rhythm and normal heart sounds.  Exam reveals no gallop and no friction rub.   No murmur heard. Pulmonary/Chest: Effort normal and breath sounds normal. No respiratory distress. He has no wheezes. He has no rales. He exhibits no tenderness.  Abdominal: He exhibits no distension and no mass. There is no tenderness. There is no rebound and no guarding.  Musculoskeletal: He exhibits no edema and no tenderness.  Lymphadenopathy:    He has no cervical adenopathy.  Neurological: He is alert and oriented to person, place, and time. He has normal reflexes. He exhibits normal muscle tone. Coordination normal.  Skin: Skin is warm and dry. He is  not diaphoretic. No erythema. No pallor.  Psychiatric: He has a normal mood and affect. His  behavior is normal. Judgment and thought content normal.          Assessment & Plan:  HIV DISEASE Reasonable control. DC bactrim given close to 200 on CD4 for many months now. DC fluconazole given greater than a year of fluconazole again with near restoration to above 200 for CD4  CRYPTOCOCCAL MENINGITIS See above dc fluconazole  Renal insufficiency Improved at last visit. HLA [id number] negative and can change to epzicom with sustiva if he has problems with this again--though he really just wants one pill once a day  Encephalopathy resolved

## 2011-07-18 NOTE — Assessment & Plan Note (Signed)
Reasonable control. DC bactrim given close to 200 on CD4 for many months now. DC fluconazole given greater than a year of fluconazole again with near restoration to above 200 for CD4

## 2011-07-18 NOTE — Assessment & Plan Note (Signed)
See above dc fluconazole

## 2011-07-19 LAB — COMPLETE METABOLIC PANEL WITH GFR
Albumin: 3.7 g/dL (ref 3.5–5.2)
BUN: 18 mg/dL (ref 6–23)
CO2: 26 mEq/L (ref 19–32)
GFR, Est African American: 67 mL/min
GFR, Est Non African American: 58 mL/min — ABNORMAL LOW
Glucose, Bld: 84 mg/dL (ref 70–99)
Potassium: 4.2 mEq/L (ref 3.5–5.3)
Sodium: 138 mEq/L (ref 135–145)
Total Protein: 6.7 g/dL (ref 6.0–8.3)

## 2011-07-19 LAB — CBC WITH DIFFERENTIAL/PLATELET
Basophils Absolute: 0 10*3/uL (ref 0.0–0.1)
Basophils Relative: 1 % (ref 0–1)
Eosinophils Absolute: 0.1 10*3/uL (ref 0.0–0.7)
Eosinophils Relative: 3 % (ref 0–5)
MCH: 32.8 pg (ref 26.0–34.0)
MCHC: 34.2 g/dL (ref 30.0–36.0)
MCV: 95.7 fL (ref 78.0–100.0)
Platelets: 224 10*3/uL (ref 150–400)
RDW: 13.6 % (ref 11.5–15.5)

## 2011-07-19 LAB — LIPID PANEL
Cholesterol: 213 mg/dL — ABNORMAL HIGH (ref 0–200)
HDL: 33 mg/dL — ABNORMAL LOW (ref >39)

## 2011-07-19 LAB — HIV-1 RNA QUANT-NO REFLEX-BLD: HIV 1 RNA Quant: 55 copies/mL — ABNORMAL HIGH (ref <20)

## 2011-07-20 ENCOUNTER — Telehealth: Payer: Self-pay | Admitting: *Deleted

## 2011-07-20 NOTE — Telephone Encounter (Signed)
[id number] & fax [phone]. Sent the requested med order to manager of the facility

## 2011-07-29 ENCOUNTER — Other Ambulatory Visit: Payer: Self-pay | Admitting: Infectious Disease

## 2011-07-29 ENCOUNTER — Other Ambulatory Visit: Payer: Self-pay | Admitting: *Deleted

## 2011-07-29 DIAGNOSIS — B20 Human immunodeficiency virus [HIV] disease: Secondary | ICD-10-CM

## 2011-07-29 MED ORDER — EFAVIRENZ-EMTRICITAB-TENOFOVIR 600-200-300 MG PO TABS: 1.0000 | ORAL_TABLET | Freq: Every day | ORAL | Status: DC

## 2011-10-26 ENCOUNTER — Encounter: Payer: Self-pay | Admitting: Infectious Disease

## 2011-10-26 ENCOUNTER — Other Ambulatory Visit (HOSPITAL_COMMUNITY)
Admission: RE | Admit: 2011-10-26 | Discharge: 2011-10-26 | Disposition: A | Discharge status: Home / Self Care | Payer: Medicare Other | Source: Ambulatory Visit | Attending: Infectious Disease | Admitting: Infectious Disease

## 2011-10-26 ENCOUNTER — Ambulatory Visit (INDEPENDENT_AMBULATORY_CARE_PROVIDER_SITE_OTHER): Payer: Medicare Other | Admitting: Infectious Disease

## 2011-10-26 VITALS — BP 109/74 | HR 69 | Temp 98.1°F | Ht 68.0 in | Wt 134.2 lb

## 2011-10-26 DIAGNOSIS — Z113 Encounter for screening for infections with a predominantly sexual mode of transmission: Secondary | ICD-10-CM

## 2011-10-26 DIAGNOSIS — E785 Hyperlipidemia, unspecified: Secondary | ICD-10-CM | POA: Diagnosis not present

## 2011-10-26 DIAGNOSIS — K089 Disorder of teeth and supporting structures, unspecified: Secondary | ICD-10-CM

## 2011-10-26 DIAGNOSIS — B459 Cryptococcosis, unspecified: Secondary | ICD-10-CM

## 2011-10-26 DIAGNOSIS — B20 Human immunodeficiency virus [HIV] disease: Secondary | ICD-10-CM

## 2011-10-26 DIAGNOSIS — S025XXA Fracture of tooth (traumatic), initial encounter for closed fracture: Secondary | ICD-10-CM

## 2011-10-26 LAB — CBC WITH DIFFERENTIAL/PLATELET
Basophils Relative: 1 % (ref 0–1)
Eosinophils Absolute: 0.3 10*3/uL (ref 0.0–0.7)
HCT: 41.7 % (ref 39.0–52.0)
Hemoglobin: 14.7 g/dL (ref 13.0–17.0)
MCH: 32.9 pg (ref 26.0–34.0)
MCHC: 35.3 g/dL (ref 30.0–36.0)
Monocytes Absolute: 0.5 10*3/uL (ref 0.1–1.0)
Monocytes Relative: 6 % (ref 3–12)

## 2011-10-26 LAB — LIPID PANEL
Cholesterol: 186 mg/dL (ref 0–200)
LDL Cholesterol: 105 mg/dL — ABNORMAL HIGH (ref 0–99)
Total CHOL/HDL Ratio: 4.4 Ratio
VLDL: 39 mg/dL (ref 0–40)

## 2011-10-26 LAB — COMPLETE METABOLIC PANEL WITH GFR
ALT: 11 U/L (ref 0–53)
Alkaline Phosphatase: 128 U/L — ABNORMAL HIGH (ref 39–117)
CO2: 25 mEq/L (ref 19–32)
Sodium: 139 mEq/L (ref 135–145)
Total Bilirubin: 0.3 mg/dL (ref 0.3–1.2)
Total Protein: 7.2 g/dL (ref 6.0–8.3)

## 2011-10-26 NOTE — Assessment & Plan Note (Signed)
Needs to see a denstist

## 2011-10-26 NOTE — Assessment & Plan Note (Signed)
Pt has done VERY well recently. Recheck chem with GFR and if cr is up will change to sustiva and epzicom rtc in 2 months

## 2011-10-26 NOTE — Assessment & Plan Note (Signed)
Completed therapy now off fluconazole

## 2011-10-26 NOTE — Progress Notes (Signed)
  Subjective:    Patient ID: Brian Marquez, male    DOB: 07/07/1960, 51 y.o.   MRN: [id number]  HPI 51 year old Caucasian man with HIV/AIDS, prior polysubstance abuse in the past HIV encephalopathy still living in ALF facility. Phone number is [phone]. He has been fairly complpiant with atripla and last VL was 55. CD4 was just below 200 in May. He has had some CKD with creatinine fluctuating and we have considered change to epzicom with sustiva. Pt would like repeat labs done first before any change made. He has poor dentition and promises to see dentist after he moves out of alf. Otherwise he is in GREAT spirits.  Review of Systems  Constitutional: Negative for fever, chills, diaphoresis, activity change, appetite change, fatigue and unexpected weight change.  HENT: Negative for congestion, sore throat, rhinorrhea, sneezing, trouble swallowing and sinus pressure.   Eyes: Negative for photophobia and visual disturbance.  Respiratory: Negative for cough, chest tightness, shortness of breath, wheezing and stridor.   Cardiovascular: Negative for chest pain, palpitations and leg swelling.  Gastrointestinal: Negative for nausea, vomiting, abdominal pain, diarrhea, constipation, blood in stool, abdominal distention and anal bleeding.  Genitourinary: Negative for dysuria, hematuria, flank pain and difficulty urinating.  Musculoskeletal: Negative for myalgias, back pain, joint swelling, arthralgias and gait problem.  Skin: Negative for color change, pallor, rash and wound.  Neurological: Negative for dizziness, tremors, weakness and light-headedness.  Hematological: Negative for adenopathy. Does not bruise/bleed easily.  Psychiatric/Behavioral: Negative for behavioral problems, confusion, disturbed wake/sleep cycle, dysphoric mood, decreased concentration and agitation.       Objective:   Physical Exam  Constitutional: He is oriented to person, place, and time. He appears well-developed and  well-nourished. No distress.  HENT:  Head: Normocephalic and atraumatic.  Mouth/Throat: Oropharynx is clear and moist. Abnormal dentition. Dental caries present. No oropharyngeal exudate.    Eyes: Conjunctivae and EOM are normal. Pupils are equal, round, and reactive to light. No scleral icterus.  Neck: Normal range of motion. Neck supple. No JVD present.  Cardiovascular: Normal rate, regular rhythm and normal heart sounds.  Exam reveals no gallop and no friction rub.   No murmur heard. Pulmonary/Chest: Effort normal and breath sounds normal. No respiratory distress. He has no wheezes. He has no rales. He exhibits no tenderness.  Abdominal: He exhibits no distension and no mass. There is no tenderness. There is no rebound and no guarding.  Musculoskeletal: He exhibits no edema and no tenderness.  Lymphadenopathy:    He has no cervical adenopathy.  Neurological: He is alert and oriented to person, place, and time. He has normal reflexes. He exhibits normal muscle tone. Coordination normal.  Skin: Skin is warm and dry. He is not diaphoretic. No erythema. No pallor.  Psychiatric: He has a normal mood and affect. His behavior is normal. Judgment and thought content normal.          Assessment & Plan:  HIV DISEASE Pt has done VERY well recently. Recheck chem with GFR and if cr is up will change to sustiva and epzicom rtc in 2 months  CRYPTOCOCCAL MENINGITIS Completed therapy now off fluconazole  Broken teeth Needs to see a denstist

## 2011-10-26 NOTE — Patient Instructions (Addendum)
KAREN CAN YOU MAKE SURE WE GET GFR WITH HIS CHEMISTRY

## 2011-12-26 ENCOUNTER — Ambulatory Visit: Payer: Medicare Other | Admitting: Infectious Disease

## 2011-12-26 ENCOUNTER — Other Ambulatory Visit: Payer: Medicare Other

## 2011-12-26 DIAGNOSIS — B459 Cryptococcosis, unspecified: Secondary | ICD-10-CM

## 2011-12-26 DIAGNOSIS — F1910 Other psychoactive substance abuse, uncomplicated: Secondary | ICD-10-CM

## 2011-12-26 DIAGNOSIS — Z590 Homelessness: Secondary | ICD-10-CM

## 2011-12-26 DIAGNOSIS — G9340 Encephalopathy, unspecified: Secondary | ICD-10-CM

## 2011-12-26 DIAGNOSIS — B20 Human immunodeficiency virus [HIV] disease: Secondary | ICD-10-CM

## 2011-12-26 DIAGNOSIS — A812 Progressive multifocal leukoencephalopathy: Secondary | ICD-10-CM

## 2011-12-26 LAB — CBC WITH DIFFERENTIAL/PLATELET
Basophils Absolute: 0 10*3/uL (ref 0.0–0.1)
Eosinophils Absolute: 0.1 10*3/uL (ref 0.0–0.7)
Eosinophils Relative: 1 % (ref 0–5)
HCT: 43.1 % (ref 39.0–52.0)
Lymphocytes Relative: 17 % (ref 12–46)
MCH: 33 pg (ref 26.0–34.0)
MCV: 92.5 fL (ref 78.0–100.0)
Monocytes Absolute: 0.5 10*3/uL (ref 0.1–1.0)
Platelets: 237 10*3/uL (ref 150–400)
RDW: 14.1 % (ref 11.5–15.5)
WBC: 7.5 10*3/uL (ref 4.0–10.5)

## 2011-12-26 LAB — COMPREHENSIVE METABOLIC PANEL
AST: 18 U/L (ref 0–37)
BUN: 14 mg/dL (ref 6–23)
CO2: 26 mEq/L (ref 19–32)
Calcium: 9.4 mg/dL (ref 8.4–10.5)
Chloride: 106 mEq/L (ref 96–112)
Creat: 1.28 mg/dL (ref 0.50–1.35)
Total Bilirubin: 0.3 mg/dL (ref 0.3–1.2)

## 2011-12-27 ENCOUNTER — Ambulatory Visit: Payer: Medicare Other | Admitting: Infectious Disease

## 2011-12-27 LAB — T-HELPER CELL (CD4) - (RCID CLINIC ONLY): CD4 T Cell Abs: 210 uL — ABNORMAL LOW (ref 400–2700)

## 2011-12-27 LAB — HIV-1 RNA QUANT-NO REFLEX-BLD: HIV 1 RNA Quant: 23 copies/mL — ABNORMAL HIGH (ref <20)

## 2012-01-09 ENCOUNTER — Other Ambulatory Visit: Payer: Self-pay | Admitting: *Deleted

## 2012-01-09 DIAGNOSIS — R21 Rash and other nonspecific skin eruption: Secondary | ICD-10-CM

## 2012-01-09 MED ORDER — TRIAMCINOLONE ACETONIDE 0.5 % EX CREA: 1.0000 "application " | TOPICAL_CREAM | Freq: Four times a day (QID) | CUTANEOUS | Status: DC

## 2012-03-06 ENCOUNTER — Encounter: Payer: Self-pay | Admitting: Infectious Disease

## 2012-03-06 ENCOUNTER — Ambulatory Visit (INDEPENDENT_AMBULATORY_CARE_PROVIDER_SITE_OTHER): Payer: Medicare Other | Admitting: Infectious Disease

## 2012-03-06 VITALS — BP 134/81 | HR 67 | Temp 97.8°F | Ht 68.0 in | Wt 138.0 lb

## 2012-03-06 DIAGNOSIS — L259 Unspecified contact dermatitis, unspecified cause: Secondary | ICD-10-CM

## 2012-03-06 DIAGNOSIS — F17200 Nicotine dependence, unspecified, uncomplicated: Secondary | ICD-10-CM

## 2012-03-06 DIAGNOSIS — B20 Human immunodeficiency virus [HIV] disease: Secondary | ICD-10-CM

## 2012-03-06 DIAGNOSIS — IMO0001 Reserved for inherently not codable concepts without codable children: Secondary | ICD-10-CM

## 2012-03-06 DIAGNOSIS — F0280 Dementia in other diseases classified elsewhere without behavioral disturbance: Secondary | ICD-10-CM

## 2012-03-06 DIAGNOSIS — L309 Dermatitis, unspecified: Secondary | ICD-10-CM

## 2012-03-06 LAB — CBC WITH DIFFERENTIAL/PLATELET
Basophils Relative: 1 % (ref 0–1)
Eosinophils Absolute: 0.1 10*3/uL (ref 0.0–0.7)
HCT: 43 % (ref 39.0–52.0)
Hemoglobin: 15.3 g/dL (ref 13.0–17.0)
MCH: 33.3 pg (ref 26.0–34.0)
MCHC: 35.6 g/dL (ref 30.0–36.0)
Monocytes Absolute: 0.4 10*3/uL (ref 0.1–1.0)
Monocytes Relative: 7 % (ref 3–12)

## 2012-03-06 LAB — COMPLETE METABOLIC PANEL WITH GFR
ALT: 14 U/L (ref 0–53)
Alkaline Phosphatase: 95 U/L (ref 39–117)
CO2: 26 mEq/L (ref 19–32)
GFR, Est African American: 83 mL/min
Potassium: 4.1 mEq/L (ref 3.5–5.3)
Sodium: 136 mEq/L (ref 135–145)
Total Bilirubin: 0.4 mg/dL (ref 0.3–1.2)
Total Protein: 7.4 g/dL (ref 6.0–8.3)

## 2012-03-06 NOTE — Progress Notes (Signed)
  Subjective:    Patient ID: Brian Marquez, male    DOB: Jun 30, 1960, 52 y.o.   MRN: [id number]  HPI  52 year old Caucasian man with HIV/AIDS, prior polysubstance abuse in the past HIV encephalopathy still living in ALF facility. Phone number is  [phone]. He has been fairly complpiant with atripla and last VL this fall was 28. CD4  Is above 200. He is taking intermittent triamcinolone for rash. We discussed transtion to tivicay/epzicom if he ever truly does move out of ALF. He refused flu vaccine.  Review of Systems  Constitutional: Negative for fever, chills, diaphoresis, activity change, appetite change, fatigue and unexpected weight change.  HENT: Negative for congestion, sore throat, rhinorrhea, sneezing, trouble swallowing and sinus pressure.   Eyes: Negative for photophobia and visual disturbance.  Respiratory: Negative for cough, chest tightness, shortness of breath, wheezing and stridor.   Cardiovascular: Negative for chest pain, palpitations and leg swelling.  Gastrointestinal: Negative for nausea, vomiting, abdominal pain, diarrhea, constipation, blood in stool, abdominal distention and anal bleeding.  Genitourinary: Negative for dysuria, hematuria, flank pain and difficulty urinating.  Musculoskeletal: Negative for myalgias, back pain, joint swelling, arthralgias and gait problem.  Skin: Negative for color change, pallor, rash and wound.  Neurological: Negative for dizziness, tremors, weakness and light-headedness.  Hematological: Negative for adenopathy. Does not bruise/bleed easily.  Psychiatric/Behavioral: Negative for behavioral problems, confusion, sleep disturbance, dysphoric mood, decreased concentration and agitation.       Objective:   Physical Exam  Constitutional: He is oriented to person, place, and time. He appears well-developed and well-nourished. No distress.  HENT:  Head: Normocephalic and atraumatic.  Mouth/Throat: Oropharynx is clear and moist. No  oropharyngeal exudate.  Eyes: Conjunctivae normal and EOM are normal. Pupils are equal, round, and reactive to light. No scleral icterus.  Neck: Normal range of motion. Neck supple. No JVD present.  Cardiovascular: Normal rate, regular rhythm and normal heart sounds.  Exam reveals no gallop and no friction rub.   No murmur heard. Pulmonary/Chest: Effort normal and breath sounds normal. No respiratory distress. He has no wheezes. He has no rales. He exhibits no tenderness.  Abdominal: He exhibits no distension and no mass. There is no tenderness. There is no rebound and no guarding.  Musculoskeletal: He exhibits no edema and no tenderness.  Lymphadenopathy:    He has no cervical adenopathy.  Neurological: He is alert and oriented to person, place, and time. He has normal reflexes. He exhibits normal muscle tone. Coordination normal.  Skin: Skin is warm and dry. He is not diaphoretic. No erythema. No pallor.  Psychiatric: He has a normal mood and affect. His behavior is normal. Judgment and thought content normal.          Assessment & Plan:  HIV: continue atripla  Dermatitis: continue prn triamcinolone  HCM: push for flu vaccine at ALF  Smoking: counselled to quit  Dementia: undoubtedly still with some cognitive deficits

## 2012-06-20 ENCOUNTER — Encounter: Payer: Self-pay | Admitting: Infectious Disease

## 2012-06-20 ENCOUNTER — Ambulatory Visit (INDEPENDENT_AMBULATORY_CARE_PROVIDER_SITE_OTHER): Payer: Medicare Other | Admitting: Infectious Disease

## 2012-06-20 VITALS — BP 115/72 | HR 57 | Temp 97.8°F | Wt 129.0 lb

## 2012-06-20 DIAGNOSIS — L659 Nonscarring hair loss, unspecified: Secondary | ICD-10-CM | POA: Diagnosis not present

## 2012-06-20 DIAGNOSIS — F0280 Dementia in other diseases classified elsewhere without behavioral disturbance: Secondary | ICD-10-CM

## 2012-06-20 DIAGNOSIS — B20 Human immunodeficiency virus [HIV] disease: Secondary | ICD-10-CM

## 2012-06-20 LAB — COMPLETE METABOLIC PANEL WITH GFR
ALT: 10 U/L (ref 0–53)
Albumin: 4 g/dL (ref 3.5–5.2)
Alkaline Phosphatase: 105 U/L (ref 39–117)
CO2: 24 mEq/L (ref 19–32)
GFR, Est Non African American: 62 mL/min
Glucose, Bld: 106 mg/dL — ABNORMAL HIGH (ref 70–99)
Potassium: 4 mEq/L (ref 3.5–5.3)
Sodium: 138 mEq/L (ref 135–145)
Total Protein: 6.8 g/dL (ref 6.0–8.3)

## 2012-06-20 LAB — CBC WITH DIFFERENTIAL/PLATELET
Basophils Relative: 0 % (ref 0–1)
Hemoglobin: 14.4 g/dL (ref 13.0–17.0)
Lymphs Abs: 1.1 10*3/uL (ref 0.7–4.0)
Monocytes Relative: 6 % (ref 3–12)
Neutro Abs: 5.1 10*3/uL (ref 1.7–7.7)
Neutrophils Relative %: 77 % (ref 43–77)
RBC: 4.45 MIL/uL (ref 4.22–5.81)

## 2012-06-20 NOTE — Progress Notes (Signed)
  Subjective:    Patient ID: Brian Marquez, male    DOB: October 20, 1960, 52 y.o.   MRN: [id number]  HPI   52 year old Caucasian man with HIV/AIDS, prior polysubstance abuse in the past HIV encephalopathy still living in ALF facility. Phone number is  [phone]. He has been fairly complpiant with atripla and last VL undetectable. CD4  Is above 200. He is taking intermittent triamcinolone for rash. He is concerned by recent hair loss and wonders if this might be due to [place].    Review of Systems  Constitutional: Negative for fever, chills, diaphoresis, activity change, appetite change, fatigue and unexpected weight change.  HENT: Negative for congestion, sore throat, rhinorrhea, sneezing, trouble swallowing and sinus pressure.   Eyes: Negative for photophobia and visual disturbance.  Respiratory: Negative for cough, chest tightness, shortness of breath, wheezing and stridor.   Cardiovascular: Negative for chest pain, palpitations and leg swelling.  Gastrointestinal: Negative for nausea, vomiting, abdominal pain, diarrhea, constipation, blood in stool, abdominal distention and anal bleeding.  Genitourinary: Negative for dysuria, hematuria, flank pain and difficulty urinating.  Musculoskeletal: Negative for myalgias, back pain, joint swelling, arthralgias and gait problem.  Skin: Negative for color change, pallor, rash and wound.  Neurological: Negative for dizziness, tremors, weakness and light-headedness.  Hematological: Negative for adenopathy. Does not bruise/bleed easily.  Psychiatric/Behavioral: Negative for behavioral problems, confusion, sleep disturbance, dysphoric mood, decreased concentration and agitation.       Objective:   Physical Exam  Constitutional: He is oriented to person, place, and time. He appears well-developed and well-nourished. No distress.  HENT:  Head: Normocephalic and atraumatic.  Mouth/Throat: Oropharynx is clear and moist. No oropharyngeal exudate.   Eyes: Conjunctivae and EOM are normal. Pupils are equal, round, and reactive to light. No scleral icterus.  Neck: Normal range of motion. Neck supple. No JVD present.  Cardiovascular: Normal rate, regular rhythm and normal heart sounds.  Exam reveals no gallop and no friction rub.   No murmur heard. Pulmonary/Chest: Effort normal and breath sounds normal. No respiratory distress. He has no wheezes. He has no rales. He exhibits no tenderness.  Abdominal: He exhibits no distension and no mass. There is no tenderness. There is no rebound and no guarding.  Musculoskeletal: He exhibits no edema and no tenderness.  Lymphadenopathy:    He has no cervical adenopathy.  Neurological: He is alert and oriented to person, place, and time. He has normal reflexes. He exhibits normal muscle tone. Coordination normal.  Skin: Skin is warm and dry. He is not diaphoretic. No erythema. No pallor.  Psychiatric: He has a normal mood and affect. His behavior is normal. Judgment and thought content normal.    He is smiling wearing headphones about his neck playing 'guns and roses'      Assessment & Plan:  HIV: continue atripla  Dermatitis: continue prn triamcinolone  HCM: push for flu vaccine at ALF  Smoking: counselled to quit  Dementia: undoubtedly still with some cognitive deficits  Hair loss: check TSH

## 2012-06-21 LAB — T-HELPER CELL (CD4) - (RCID CLINIC ONLY)
CD4 % Helper T Cell: 21 % — ABNORMAL LOW (ref 33–55)
CD4 T Cell Abs: 240 uL — ABNORMAL LOW (ref 400–2700)

## 2012-06-22 LAB — HIV-1 RNA QUANT-NO REFLEX-BLD
HIV 1 RNA Quant: <20 copies/mL (ref <20)
HIV-1 RNA Quant, Log: <1.3 {Log} (ref <1.30)

## 2012-07-28 ENCOUNTER — Other Ambulatory Visit: Payer: Self-pay | Admitting: Infectious Disease

## 2012-07-30 ENCOUNTER — Other Ambulatory Visit: Payer: Self-pay | Admitting: Infectious Diseases

## 2012-08-22 ENCOUNTER — Ambulatory Visit: Payer: Medicare Other | Admitting: Infectious Disease

## 2012-08-30 ENCOUNTER — Ambulatory Visit (INDEPENDENT_AMBULATORY_CARE_PROVIDER_SITE_OTHER): Payer: Medicare Other | Admitting: Infectious Disease

## 2012-08-30 ENCOUNTER — Encounter: Payer: Self-pay | Admitting: Infectious Disease

## 2012-08-30 DIAGNOSIS — F17200 Nicotine dependence, unspecified, uncomplicated: Secondary | ICD-10-CM

## 2012-08-30 DIAGNOSIS — E785 Hyperlipidemia, unspecified: Secondary | ICD-10-CM

## 2012-08-30 DIAGNOSIS — B20 Human immunodeficiency virus [HIV] disease: Secondary | ICD-10-CM

## 2012-08-30 DIAGNOSIS — Z113 Encounter for screening for infections with a predominantly sexual mode of transmission: Secondary | ICD-10-CM

## 2012-08-30 LAB — LIPID PANEL
Cholesterol: 172 mg/dL (ref 0–200)
LDL Cholesterol: 94 mg/dL (ref 0–99)
Total CHOL/HDL Ratio: 4.3 Ratio
VLDL: 38 mg/dL (ref 0–40)

## 2012-08-30 LAB — CBC WITH DIFFERENTIAL/PLATELET
Basophils Relative: 1 % (ref 0–1)
Eosinophils Absolute: 0.2 10*3/uL (ref 0.0–0.7)
Eosinophils Relative: 2 % (ref 0–5)
HCT: 38.7 % — ABNORMAL LOW (ref 39.0–52.0)
Hemoglobin: 13.7 g/dL (ref 13.0–17.0)
Lymphs Abs: 1.2 10*3/uL (ref 0.7–4.0)
MCH: 32.9 pg (ref 26.0–34.0)
MCHC: 35.4 g/dL (ref 30.0–36.0)
MCV: 92.8 fL (ref 78.0–100.0)
Monocytes Absolute: 0.4 10*3/uL (ref 0.1–1.0)
Monocytes Relative: 7 % (ref 3–12)
RBC: 4.17 MIL/uL — ABNORMAL LOW (ref 4.22–5.81)

## 2012-08-30 MED ORDER — TRIAMCINOLONE ACETONIDE 0.5 % EX CREA: TOPICAL_CREAM | Freq: Two times a day (BID) | CUTANEOUS | Status: DC | PRN

## 2012-08-30 MED ORDER — EFAVIRENZ-EMTRICITAB-TENOFOVIR 600-200-300 MG PO TABS: 1.0000 | ORAL_TABLET | Freq: Every day | ORAL | Status: DC

## 2012-08-30 NOTE — Progress Notes (Signed)
  Subjective:    Patient ID: Brian Marquez, male    DOB: November 07, 1960, 52 y.o.   MRN: [id number]  HPI   52 year old Caucasian man with HIV/AIDS, prior polysubstance abuse in the past HIV encephalopathy still living in ALF facility. Phone number is  [phone]. He has been complpliant with atripla and last VL undetectable. CD4  Is above 200. He is taking intermittent triamcinolone for rash.   Review of Systems  Constitutional: Negative for fever, chills, diaphoresis, activity change, appetite change, fatigue and unexpected weight change.  HENT: Negative for congestion, sore throat, rhinorrhea, sneezing, trouble swallowing and sinus pressure.   Eyes: Negative for photophobia and visual disturbance.  Respiratory: Negative for cough, chest tightness, shortness of breath, wheezing and stridor.   Cardiovascular: Negative for chest pain, palpitations and leg swelling.  Gastrointestinal: Negative for nausea, vomiting, abdominal pain, diarrhea, constipation, blood in stool, abdominal distention and anal bleeding.  Genitourinary: Negative for dysuria, hematuria, flank pain and difficulty urinating.  Musculoskeletal: Negative for myalgias, back pain, joint swelling, arthralgias and gait problem.  Skin: Negative for color change, pallor, rash and wound.  Neurological: Negative for dizziness, tremors, weakness and light-headedness.  Hematological: Negative for adenopathy. Does not bruise/bleed easily.  Psychiatric/Behavioral: Negative for behavioral problems, confusion, sleep disturbance, dysphoric mood, decreased concentration and agitation.       Objective:   Physical Exam  Constitutional: He is oriented to person, place, and time. He appears well-developed and well-nourished. No distress.  HENT:  Head: Normocephalic and atraumatic.  Mouth/Throat: Oropharynx is clear and moist. No oropharyngeal exudate.  Eyes: Conjunctivae and EOM are normal. Pupils are equal, round, and reactive to light. No  scleral icterus.  Neck: Normal range of motion. Neck supple. No JVD present.  Cardiovascular: Normal rate, regular rhythm and normal heart sounds.  Exam reveals no gallop and no friction rub.   No murmur heard. Pulmonary/Chest: Effort normal and breath sounds normal. No respiratory distress. He has no wheezes. He has no rales. He exhibits no tenderness.  Abdominal: He exhibits no distension and no mass. There is no tenderness. There is no rebound and no guarding.  Musculoskeletal: He exhibits no edema and no tenderness.  Lymphadenopathy:    He has no cervical adenopathy.  Neurological: He is alert and oriented to person, place, and time. He has normal reflexes. He exhibits normal muscle tone. Coordination normal.  Skin: Skin is warm and dry. He is not diaphoretic. No erythema. No pallor.  Psychiatric: He has a normal mood and affect. His behavior is normal. Judgment and thought content normal.    He is smiling wearing headphones about his neck playing 'guns and roses'      Assessment & Plan:  HIV: continue atripla  Dermatitis: continue prn triamcinolone  HCM: push for flu vaccine at ALF  Smoking: counselled to quit  Dementia: undoubtedly still with some cognitive deficits

## 2012-08-31 LAB — COMPLETE METABOLIC PANEL WITH GFR
ALT: 12 U/L (ref 0–53)
Albumin: 3.9 g/dL (ref 3.5–5.2)
Alkaline Phosphatase: 95 U/L (ref 39–117)
Glucose, Bld: 103 mg/dL — ABNORMAL HIGH (ref 70–99)
Potassium: 3.8 mEq/L (ref 3.5–5.3)
Sodium: 135 mEq/L (ref 135–145)
Total Bilirubin: 0.3 mg/dL (ref 0.3–1.2)
Total Protein: 6.4 g/dL (ref 6.0–8.3)

## 2012-08-31 LAB — RPR

## 2012-09-03 LAB — T-HELPER CELL (CD4) - (RCID CLINIC ONLY): CD4 T Cell Abs: 280 uL — ABNORMAL LOW (ref 400–2700)

## 2012-09-04 LAB — HIV-1 RNA QUANT-NO REFLEX-BLD: HIV-1 RNA Quant, Log: <1.3 {Log} (ref <1.30)

## 2013-01-09 ENCOUNTER — Ambulatory Visit (INDEPENDENT_AMBULATORY_CARE_PROVIDER_SITE_OTHER): Payer: Medicare Other | Admitting: Infectious Disease

## 2013-01-09 ENCOUNTER — Encounter: Payer: Self-pay | Admitting: Infectious Disease

## 2013-01-09 VITALS — BP 111/72 | HR 66 | Temp 98.2°F | Wt 139.0 lb

## 2013-01-09 DIAGNOSIS — Z113 Encounter for screening for infections with a predominantly sexual mode of transmission: Secondary | ICD-10-CM

## 2013-01-09 DIAGNOSIS — S025XXA Fracture of tooth (traumatic), initial encounter for closed fracture: Secondary | ICD-10-CM

## 2013-01-09 DIAGNOSIS — F0280 Dementia in other diseases classified elsewhere without behavioral disturbance: Secondary | ICD-10-CM

## 2013-01-09 DIAGNOSIS — B20 Human immunodeficiency virus [HIV] disease: Secondary | ICD-10-CM

## 2013-01-09 DIAGNOSIS — K029 Dental caries, unspecified: Secondary | ICD-10-CM

## 2013-01-09 DIAGNOSIS — F17200 Nicotine dependence, unspecified, uncomplicated: Secondary | ICD-10-CM

## 2013-01-09 LAB — COMPLETE METABOLIC PANEL WITH GFR
AST: 17 U/L (ref 0–37)
BUN: 14 mg/dL (ref 6–23)
CO2: 25 mEq/L (ref 19–32)
Calcium: 9.1 mg/dL (ref 8.4–10.5)
Chloride: 107 mEq/L (ref 96–112)
Creat: 1.33 mg/dL (ref 0.50–1.35)
GFR, Est African American: 71 mL/min

## 2013-01-09 NOTE — Progress Notes (Signed)
  Subjective:    Patient ID: Brian Marquez, male    DOB: 06/12/1960, 52 y.o.   MRN: [id number]  HPI   52 year old Caucasian man with HIV/AIDS, prior polysubstance abuse in the past HIV encephalopathy still living in ALF facility. Phone number is  [phone]. He has been complpliant with atripla and last VL undetectable. CD4  Is above 200. He is taking intermittent triamcinolone for rash. We had considered changing him off of the TNF in regimen but his creaitnine has been stable. He continues to smoke cigarettes 0.5 to one pack per day. He has poor dentition but similar my inquired re Flu fax he says "not yet."   Review of Systems  Constitutional: Negative for fever, chills, diaphoresis, activity change, appetite change, fatigue and unexpected weight change.  HENT: Negative for congestion, rhinorrhea, sinus pressure, sneezing, sore throat and trouble swallowing.   Eyes: Negative for photophobia and visual disturbance.  Respiratory: Negative for cough, chest tightness, shortness of breath, wheezing and stridor.   Cardiovascular: Negative for chest pain, palpitations and leg swelling.  Gastrointestinal: Negative for nausea, vomiting, abdominal pain, diarrhea, constipation, blood in stool, abdominal distention and anal bleeding.  Genitourinary: Negative for dysuria, hematuria, flank pain and difficulty urinating.  Musculoskeletal: Negative for arthralgias, back pain, gait problem, joint swelling and myalgias.  Skin: Negative for color change, pallor, rash and wound.  Neurological: Negative for dizziness, tremors, weakness and light-headedness.  Hematological: Negative for adenopathy. Does not bruise/bleed easily.  Psychiatric/Behavioral: Negative for behavioral problems, confusion, sleep disturbance, dysphoric mood, decreased concentration and agitation.       Objective:   Physical Exam  Constitutional: He is oriented to person, place, and time. He appears well-developed and  well-nourished. No distress.  HENT:  Head: Normocephalic and atraumatic.  Mouth/Throat: Oropharynx is clear and moist. Abnormal dentition. Dental caries present. No oropharyngeal exudate.  Eyes: Conjunctivae and EOM are normal. Pupils are equal, round, and reactive to light. No scleral icterus.  Neck: Normal range of motion. Neck supple. No JVD present.  Cardiovascular: Normal rate, regular rhythm and normal heart sounds.  Exam reveals no gallop and no friction rub.   No murmur heard. Pulmonary/Chest: Effort normal and breath sounds normal. No respiratory distress. He has no wheezes. He has no rales. He exhibits no tenderness.  Abdominal: He exhibits no distension and no mass. There is no tenderness. There is no rebound and no guarding.  Musculoskeletal: He exhibits no edema and no tenderness.  Lymphadenopathy:    He has no cervical adenopathy.  Neurological: He is alert and oriented to person, place, and time. He has normal reflexes. He exhibits normal muscle tone. Coordination normal.  Skin: Skin is warm and dry. He is not diaphoretic. No erythema. No pallor.  Psychiatric: He has a normal mood and affect. His behavior is normal. Judgment and thought content normal.          Assessment & Plan:  HIV: continue atripla  Dermatitis: continue prn triamcinolone  HCM: push for flu vaccine at ALF  Smoking: counselled to quit  Dementia: undoubtedly still with some cognitive deficits  Poor dentition, broken teeth: tried to exhort to come to our dental clinic but he says "note yet"

## 2013-01-10 LAB — CBC WITH DIFFERENTIAL/PLATELET
Lymphocytes Relative: 19 % (ref 12–46)
Lymphs Abs: 1.2 10*3/uL (ref 0.7–4.0)
Neutrophils Relative %: 72 % (ref 43–77)
Platelets: 239 10*3/uL (ref 150–400)
RBC: 4.39 MIL/uL (ref 4.22–5.81)
WBC: 6.4 10*3/uL (ref 4.0–10.5)

## 2013-01-10 LAB — T-HELPER CELL (CD4) - (RCID CLINIC ONLY)
CD4 % Helper T Cell: 18 % — ABNORMAL LOW (ref 33–55)
CD4 T Cell Abs: 230 /uL — ABNORMAL LOW (ref 400–2700)

## 2013-01-11 LAB — HIV-1 RNA QUANT-NO REFLEX-BLD: HIV 1 RNA Quant: <20 copies/mL (ref <20)

## 2013-01-17 LAB — HLA B*5701

## 2013-04-10 ENCOUNTER — Other Ambulatory Visit: Payer: Self-pay | Admitting: Infectious Disease

## 2013-05-13 ENCOUNTER — Ambulatory Visit (INDEPENDENT_AMBULATORY_CARE_PROVIDER_SITE_OTHER): Payer: Medicare Other | Admitting: Infectious Disease

## 2013-05-13 ENCOUNTER — Other Ambulatory Visit (HOSPITAL_COMMUNITY)
Admission: RE | Admit: 2013-05-13 | Discharge: 2013-05-13 | Disposition: A | Discharge status: Home / Self Care | Payer: Medicare Other | Source: Ambulatory Visit | Attending: Infectious Disease | Admitting: Infectious Disease

## 2013-05-13 ENCOUNTER — Encounter: Payer: Self-pay | Admitting: Infectious Disease

## 2013-05-13 VITALS — BP 123/77 | HR 55 | Temp 97.6°F | Wt 133.0 lb

## 2013-05-13 DIAGNOSIS — K089 Disorder of teeth and supporting structures, unspecified: Secondary | ICD-10-CM

## 2013-05-13 DIAGNOSIS — B20 Human immunodeficiency virus [HIV] disease: Secondary | ICD-10-CM | POA: Diagnosis not present

## 2013-05-13 DIAGNOSIS — Z113 Encounter for screening for infections with a predominantly sexual mode of transmission: Secondary | ICD-10-CM | POA: Diagnosis not present

## 2013-05-13 DIAGNOSIS — F17200 Nicotine dependence, unspecified, uncomplicated: Secondary | ICD-10-CM | POA: Diagnosis not present

## 2013-05-13 DIAGNOSIS — E785 Hyperlipidemia, unspecified: Secondary | ICD-10-CM

## 2013-05-13 DIAGNOSIS — F0280 Dementia in other diseases classified elsewhere without behavioral disturbance: Secondary | ICD-10-CM

## 2013-05-13 LAB — CBC WITH DIFFERENTIAL/PLATELET
BASOS PCT: 0 % (ref 0–1)
Basophils Absolute: 0 10*3/uL (ref 0.0–0.1)
Eosinophils Absolute: 0.1 10*3/uL (ref 0.0–0.7)
Eosinophils Relative: 1 % (ref 0–5)
HCT: 41.7 % (ref 39.0–52.0)
Hemoglobin: 14.7 g/dL (ref 13.0–17.0)
LYMPHS PCT: 16 % (ref 12–46)
Lymphs Abs: 1.1 10*3/uL (ref 0.7–4.0)
MCH: 33.9 pg (ref 26.0–34.0)
MCHC: 35.3 g/dL (ref 30.0–36.0)
MCV: 96.1 fL (ref 78.0–100.0)
Monocytes Absolute: 0.5 10*3/uL (ref 0.1–1.0)
Monocytes Relative: 7 % (ref 3–12)
NEUTROS ABS: 5.4 10*3/uL (ref 1.7–7.7)
NEUTROS PCT: 76 % (ref 43–77)
PLATELETS: 246 10*3/uL (ref 150–400)
RBC: 4.34 MIL/uL (ref 4.22–5.81)
RDW: 14.1 % (ref 11.5–15.5)
WBC: 7.1 10*3/uL (ref 4.0–10.5)

## 2013-05-13 LAB — COMPLETE METABOLIC PANEL WITH GFR
ALBUMIN: 4.1 g/dL (ref 3.5–5.2)
ALT: 11 U/L (ref 0–53)
AST: 17 U/L (ref 0–37)
Alkaline Phosphatase: 97 U/L (ref 39–117)
BUN: 12 mg/dL (ref 6–23)
CALCIUM: 9.3 mg/dL (ref 8.4–10.5)
CO2: 28 meq/L (ref 19–32)
Chloride: 105 mEq/L (ref 96–112)
Creat: 1.25 mg/dL (ref 0.50–1.35)
GFR, EST AFRICAN AMERICAN: 76 mL/min
GFR, EST NON AFRICAN AMERICAN: 65 mL/min
GLUCOSE: 84 mg/dL (ref 70–99)
POTASSIUM: 4.6 meq/L (ref 3.5–5.3)
SODIUM: 138 meq/L (ref 135–145)
TOTAL PROTEIN: 6.7 g/dL (ref 6.0–8.3)
Total Bilirubin: 0.2 mg/dL (ref 0.2–1.2)

## 2013-05-13 LAB — LIPID PANEL
Cholesterol: 171 mg/dL (ref 0–200)
HDL: 50 mg/dL (ref >39)
LDL CALC: 100 mg/dL — AB (ref 0–99)
Total CHOL/HDL Ratio: 3.4 Ratio
Triglycerides: 106 mg/dL (ref <150)
VLDL: 21 mg/dL (ref 0–40)

## 2013-05-13 LAB — RPR

## 2013-05-13 NOTE — Progress Notes (Signed)
  Subjective:    Patient ID: Brian Marquez, male    DOB: Sep 15, 1960, 53 y.o.   MRN: [id number]  HPI   53 year old Caucasian man with HIV/AIDS, prior polysubstance abuse in the past HIV encephalopathy still living in ALF facility. Phone number is  [phone]. He has been complpliant with atripla and last VL undetectable. CD4  Is above 200. He is taking intermittent triamcinolone for rash. We had considered changing him off of the TNF in regimen but his creaitnine has been stable.    Review of Systems  Constitutional: Negative for fever, chills, diaphoresis, activity change, appetite change, fatigue and unexpected weight change.  HENT: Negative for congestion, rhinorrhea, sinus pressure, sneezing, sore throat and trouble swallowing.   Eyes: Negative for photophobia and visual disturbance.  Respiratory: Negative for cough, chest tightness, shortness of breath, wheezing and stridor.   Cardiovascular: Negative for chest pain, palpitations and leg swelling.  Gastrointestinal: Negative for nausea, vomiting, abdominal pain, diarrhea, constipation, blood in stool, abdominal distention and anal bleeding.  Genitourinary: Negative for dysuria, hematuria, flank pain and difficulty urinating.  Musculoskeletal: Negative for arthralgias, back pain, gait problem, joint swelling and myalgias.  Skin: Negative for color change, pallor, rash and wound.  Neurological: Negative for dizziness, tremors, weakness and light-headedness.  Hematological: Negative for adenopathy. Does not bruise/bleed easily.  Psychiatric/Behavioral: Negative for behavioral problems, confusion, sleep disturbance, dysphoric mood, decreased concentration and agitation.       Objective:   Physical Exam  Constitutional: He is oriented to person, place, and time. He appears well-developed and well-nourished. No distress.  HENT:  Head: Normocephalic and atraumatic.  Mouth/Throat: Oropharynx is clear and moist. Abnormal dentition.  Dental caries present. No oropharyngeal exudate.  Eyes: Conjunctivae and EOM are normal. Pupils are equal, round, and reactive to light. No scleral icterus.  Neck: Normal range of motion. Neck supple. No JVD present.  Cardiovascular: Normal rate, regular rhythm and normal heart sounds.  Exam reveals no gallop and no friction rub.   No murmur heard. Pulmonary/Chest: Effort normal and breath sounds normal. No respiratory distress. He has no wheezes. He has no rales. He exhibits no tenderness.  Abdominal: He exhibits no distension and no mass. There is no tenderness. There is no rebound and no guarding.  Musculoskeletal: He exhibits no edema and no tenderness.  Lymphadenopathy:    He has no cervical adenopathy.  Neurological: He is alert and oriented to person, place, and time. He has normal reflexes. He exhibits normal muscle tone. Coordination normal.  Skin: Skin is warm and dry. He is not diaphoretic. No erythema. No pallor.  Psychiatric: He has a normal mood and affect. His behavior is normal. Judgment and thought content normal.          Assessment & Plan:  HIV: continue atripla, check labs today. I spent greater than 25 minutes with the patient including greater than 50% of time in face to face counsel of the patient and in coordination of their care.   Dermatitis: continue prn triamcinolone  Smoking: counselled to quit  Dementia: undoubtedly still with some cognitive deficits

## 2013-05-14 LAB — T-HELPER CELL (CD4) - (RCID CLINIC ONLY)
CD4 T CELL ABS: 230 /uL — AB (ref 400–2700)
CD4 T CELL HELPER: 18 % — AB (ref 33–55)

## 2013-05-14 LAB — HIV-1 RNA QUANT-NO REFLEX-BLD
HIV 1 RNA Quant: <20 copies/mL (ref <20)
HIV-1 RNA Quant, Log: <1.3 {Log} (ref <1.30)

## 2013-05-14 LAB — URINE CYTOLOGY ANCILLARY ONLY
CHLAMYDIA, DNA PROBE: NEGATIVE
NEISSERIA GONORRHEA: NEGATIVE

## 2013-08-14 ENCOUNTER — Other Ambulatory Visit: Payer: Self-pay | Admitting: Infectious Disease

## 2013-08-26 ENCOUNTER — Telehealth: Payer: Self-pay | Admitting: *Deleted

## 2013-08-26 NOTE — Telephone Encounter (Signed)
Per Brian MorganLewanda Marquez (administrator), patient often speaks about leaving the facility.  He is there voluntarily, although all involved believe it is the best option for his health. Ms. Brian Marquez will speak with the patient, stating that Dr. Daiva EvesVan Dam feels it is best that he stay there.  She will let Brian Marquez know the outcome. Brian Marquez, Michelle M, RN

## 2013-08-26 NOTE — Telephone Encounter (Signed)
Patient called requesting that Dr. Daiva EvesVan Dam call to [facility]anda [facility]  Please advise. Andree CossHowell, Michelle M, RN

## 2013-09-02 NOTE — Telephone Encounter (Signed)
Patient has called back again wanting to be released from the facility he is currently in.

## 2013-09-02 NOTE — Telephone Encounter (Signed)
Brian NormanLawanda Ray is her name and the phone number is [phone]. I called her but I did not get an answer. I left her a vm for her to call me back.

## 2013-09-02 NOTE — Telephone Encounter (Signed)
Can you get me the phone number of this person he wants me to talk to and I can discuss with her. I seem to recall that he was not felt competent

## 2013-09-03 NOTE — Telephone Encounter (Signed)
I discussed this case with Ms. Brian Marquez.  Apparently Mr. Brian Marquez had been asking to leave and identified 2 different places where he would move to. One of the places was visited and was a trailer park where a woman said that she rents trailers but she had no contract with Mr. Brian Marquez at all.  A second address was an individual who claimed to not even know [name].  Clearly neither location is going to be one and which Mr. Brian Marquez can reside at present.  Ms. Brian Marquez and the folks at the facility where he currently resides are understandably concerned about the safety of Mr. Brian Marquez should he leave their facility. Certainly we now in the past he was homeless and a heavy alcoholic and did not comply with his antiretroviral therapy is ultimately to him he admitted to the hospital with HIV encephalopathy and complete obtundation.  During that hospitalization he was deemed incompetent by psychiatry. He was later felt to be competent by the family medicine service and involuntarily was admitted at the assisted-living facility where he currently resides.  According to our records he was then reevaluated by another psychiatrist who during part one of a two-part evaluation and wrote that he thought that Mr. Brian Marquez was competent to make decisions.  I've talked to Ms. Ray about this and she will seek another psychiatric opinion of Mr. Brian Marquez with regards to his competency. Currently he still drinks heavily from time to time but when he returns the facility is not a lot to drink in the facility and this provided some stability they give him his antiretrovirals which he takes while he is in the facility and they gave him food and shelter. He certainly never done as well by himself when he was homeless and drinking throughout the day as he has while living at the assisted living facility which is done for the last 3 years.  Arousal I can to make an appointment with me again before making any rash decisions to leave and  essentially become homeless again.  If he were to do that I would close one us to work on getting him housing and he would need a counselor because he would undoubtedly become highly noncompliant again and would end up being readmitted to the hospital. I think for his own safety we've asked if he was deemed not competent that is a psychiatrist

## 2013-09-16 ENCOUNTER — Telehealth: Payer: Self-pay

## 2013-09-16 NOTE — Telephone Encounter (Signed)
Nursing home calling for Fl-@ form left last Thursday.   Form signed and faxed today. Patient's name was spelled incorrectly on form and corrected by physician. Form sent for scanning  Faxed to : [phone]  Laurell Josephsammy K King, RN

## 2013-11-13 ENCOUNTER — Ambulatory Visit: Payer: Medicare Other | Admitting: Infectious Disease

## 2013-12-09 ENCOUNTER — Ambulatory Visit (INDEPENDENT_AMBULATORY_CARE_PROVIDER_SITE_OTHER): Payer: Medicare Other | Admitting: Infectious Disease

## 2013-12-09 ENCOUNTER — Encounter: Payer: Self-pay | Admitting: Infectious Disease

## 2013-12-09 ENCOUNTER — Other Ambulatory Visit: Payer: Self-pay | Admitting: Infectious Disease

## 2013-12-09 VITALS — BP 120/78 | HR 66 | Temp 97.9°F | Wt 134.0 lb

## 2013-12-09 DIAGNOSIS — R7989 Other specified abnormal findings of blood chemistry: Secondary | ICD-10-CM

## 2013-12-09 DIAGNOSIS — B20 Human immunodeficiency virus [HIV] disease: Secondary | ICD-10-CM

## 2013-12-09 DIAGNOSIS — Z113 Encounter for screening for infections with a predominantly sexual mode of transmission: Secondary | ICD-10-CM | POA: Diagnosis not present

## 2013-12-09 DIAGNOSIS — F0280 Dementia in other diseases classified elsewhere without behavioral disturbance: Secondary | ICD-10-CM

## 2013-12-09 DIAGNOSIS — F17200 Nicotine dependence, unspecified, uncomplicated: Secondary | ICD-10-CM

## 2013-12-09 DIAGNOSIS — R945 Abnormal results of liver function studies: Secondary | ICD-10-CM

## 2013-12-09 NOTE — Progress Notes (Signed)
  Subjective:    Patient ID: Brian Marquez, male    DOB: [birth year], 53 y.o.   MRN: [id number]  HPI   53 year old Caucasian man with HIV/AIDS, prior polysubstance abuse in the past HIV encephalopathy still living in ALF facility. Phone number is  [phone]. He has been compliant with atripla and last VL undetectable. CD4  Is above 200. He is taking intermittent triamcinolone for rash. We had considered changing him off of the TNF in regimen but his creaitnine has been stable. He again is requesting to be released from his ALF but I have not received the records from the psychiatrist who evaluated him as an outpatient for competency.  He was agreeable to flu shot today.   Review of Systems  Constitutional: Negative for fever, chills, diaphoresis, activity change, appetite change, fatigue and unexpected weight change.  HENT: Negative for congestion, rhinorrhea, sinus pressure, sneezing, sore throat and trouble swallowing.   Eyes: Negative for photophobia and visual disturbance.  Respiratory: Negative for cough, chest tightness, shortness of breath, wheezing and stridor.   Cardiovascular: Negative for chest pain, palpitations and leg swelling.  Gastrointestinal: Negative for nausea, vomiting, abdominal pain, diarrhea, constipation, blood in stool, abdominal distention and anal bleeding.  Genitourinary: Negative for dysuria, hematuria, flank pain and difficulty urinating.  Musculoskeletal: Negative for arthralgias, back pain, gait problem, joint swelling and myalgias.  Skin: Negative for color change, pallor, rash and wound.  Neurological: Negative for dizziness, tremors, weakness and light-headedness.  Hematological: Negative for adenopathy. Does not bruise/bleed easily.  Psychiatric/Behavioral: Negative for behavioral problems, confusion, sleep disturbance, dysphoric mood, decreased concentration and agitation.       Objective:   Physical Exam  Constitutional: He is oriented to  person, place, and time. He appears well-developed and well-nourished. No distress.  HENT:  Head: Normocephalic and atraumatic.  Mouth/Throat: Oropharynx is clear and moist. Abnormal dentition. Dental caries present. No oropharyngeal exudate.  Eyes: Conjunctivae and EOM are normal. Pupils are equal, round, and reactive to light. No scleral icterus.  Neck: Normal range of motion. Neck supple. No JVD present.  Cardiovascular: Normal rate, regular rhythm and normal heart sounds.  Exam reveals no gallop and no friction rub.   No murmur heard. Pulmonary/Chest: Effort normal and breath sounds normal. No respiratory distress. He has no wheezes. He has no rales. He exhibits no tenderness.  Abdominal: He exhibits no distension and no mass. There is no tenderness. There is no rebound and no guarding.  Musculoskeletal: He exhibits no edema and no tenderness.  Lymphadenopathy:    He has no cervical adenopathy.  Neurological: He is alert and oriented to person, place, and time. He has normal reflexes. He exhibits normal muscle tone. Coordination normal.  Skin: Skin is warm and dry. He is not diaphoretic. No erythema. No pallor.  Psychiatric: He has a normal mood and affect. His behavior is normal. Judgment and thought content normal.          Assessment & Plan:  HIV: continue atripla, check labs today.  Dermatitis: continue prn triamcinolone  Smoking: counselled to quit  Dementia: undoubtedly still with some cognitive deficits Need psych eval to see what they thought re his competency

## 2014-02-10 ENCOUNTER — Ambulatory Visit (INDEPENDENT_AMBULATORY_CARE_PROVIDER_SITE_OTHER): Payer: Medicare Other | Admitting: Infectious Disease

## 2014-02-10 ENCOUNTER — Encounter: Payer: Self-pay | Admitting: Infectious Disease

## 2014-02-10 VITALS — BP 122/77 | HR 59 | Temp 97.4°F | Wt 136.0 lb

## 2014-02-10 DIAGNOSIS — E785 Hyperlipidemia, unspecified: Secondary | ICD-10-CM | POA: Diagnosis not present

## 2014-02-10 DIAGNOSIS — A812 Progressive multifocal leukoencephalopathy: Secondary | ICD-10-CM

## 2014-02-10 DIAGNOSIS — Z113 Encounter for screening for infections with a predominantly sexual mode of transmission: Secondary | ICD-10-CM | POA: Diagnosis not present

## 2014-02-10 DIAGNOSIS — G9340 Encephalopathy, unspecified: Secondary | ICD-10-CM

## 2014-02-10 DIAGNOSIS — Z720 Tobacco use: Secondary | ICD-10-CM

## 2014-02-10 DIAGNOSIS — F1910 Other psychoactive substance abuse, uncomplicated: Secondary | ICD-10-CM

## 2014-02-10 DIAGNOSIS — S025XXS Fracture of tooth (traumatic), sequela: Secondary | ICD-10-CM

## 2014-02-10 DIAGNOSIS — F17200 Nicotine dependence, unspecified, uncomplicated: Secondary | ICD-10-CM

## 2014-02-10 DIAGNOSIS — B20 Human immunodeficiency virus [HIV] disease: Secondary | ICD-10-CM

## 2014-02-10 DIAGNOSIS — F0280 Dementia in other diseases classified elsewhere without behavioral disturbance: Secondary | ICD-10-CM

## 2014-02-10 LAB — CBC WITH DIFFERENTIAL/PLATELET
Basophils Absolute: 0 K/uL (ref 0.0–0.1)
Basophils Relative: 0 % (ref 0–1)
Eosinophils Absolute: 0.1 K/uL (ref 0.0–0.7)
Eosinophils Relative: 1 % (ref 0–5)
HCT: 43 % (ref 39.0–52.0)
Hemoglobin: 15.2 g/dL (ref 13.0–17.0)
Lymphocytes Relative: 15 % (ref 12–46)
Lymphs Abs: 1.1 K/uL (ref 0.7–4.0)
MCH: 34.1 pg — ABNORMAL HIGH (ref 26.0–34.0)
MCHC: 35.3 g/dL (ref 30.0–36.0)
MCV: 96.4 fL (ref 78.0–100.0)
MPV: 8.7 fL — ABNORMAL LOW (ref 9.4–12.4)
Monocytes Absolute: 0.5 K/uL (ref 0.1–1.0)
Monocytes Relative: 7 % (ref 3–12)
Neutro Abs: 5.4 K/uL (ref 1.7–7.7)
Neutrophils Relative %: 77 % (ref 43–77)
Platelets: 236 K/uL (ref 150–400)
RBC: 4.46 MIL/uL (ref 4.22–5.81)
RDW: 13.6 % (ref 11.5–15.5)
WBC: 7 K/uL (ref 4.0–10.5)

## 2014-02-10 LAB — COMPLETE METABOLIC PANEL WITH GFR
ALBUMIN: 3.9 g/dL (ref 3.5–5.2)
ALK PHOS: 101 U/L (ref 39–117)
ALT: 14 U/L (ref 0–53)
AST: 20 U/L (ref 0–37)
BUN: 13 mg/dL (ref 6–23)
CO2: 24 mEq/L (ref 19–32)
Calcium: 9.3 mg/dL (ref 8.4–10.5)
Chloride: 105 mEq/L (ref 96–112)
Creat: 1.17 mg/dL (ref 0.50–1.35)
GFR, Est African American: 81 mL/min
GFR, Est Non African American: 70 mL/min
Glucose, Bld: 92 mg/dL (ref 70–99)
POTASSIUM: 4.1 meq/L (ref 3.5–5.3)
Sodium: 136 mEq/L (ref 135–145)
Total Bilirubin: 0.3 mg/dL (ref 0.2–1.2)
Total Protein: 6.5 g/dL (ref 6.0–8.3)

## 2014-02-10 LAB — LIPID PANEL
CHOLESTEROL: 156 mg/dL (ref 0–200)
HDL: 49 mg/dL (ref >39)
LDL Cholesterol: 83 mg/dL (ref 0–99)
TRIGLYCERIDES: 120 mg/dL (ref <150)
Total CHOL/HDL Ratio: 3.2 Ratio
VLDL: 24 mg/dL (ref 0–40)

## 2014-02-10 LAB — HEPATITIS C ANTIBODY: HCV Ab: NEGATIVE

## 2014-02-10 LAB — RPR

## 2014-02-10 MED ORDER — TRIAMCINOLONE ACETONIDE 0.5 % EX CREA: TOPICAL_CREAM | CUTANEOUS | Status: DC

## 2014-02-10 NOTE — Progress Notes (Signed)
  Subjective:    Patient ID: Brian Marquez, male    DOB: Sep 26, 1960, 53 y.o.   MRN: [id number]  HPI   53 year old Caucasian man with HIV/AIDS, prior polysubstance abuse in the past HIV encephalopathy still living in ALF facility. Phone number is  [phone]. He has been compliant with atripla and last VL undetectable. CD4  Is above 200. He is taking intermittent triamcinolone for rash. We had considered changing him off of the TNF in regimen but his creaitnine has been stable.   He again is requesting to be released from his ALF but I have not received the records from the psychiatrist who evaluated him.  He was seen by me in October but no labs done.  He had accepted flu shot from me but refused from RN and now today says he did get shot for flu.      Review of Systems  Constitutional: Negative for fever, chills, diaphoresis, activity change, appetite change, fatigue and unexpected weight change.  HENT: Negative for congestion, rhinorrhea, sinus pressure, sneezing, sore throat and trouble swallowing.   Eyes: Negative for photophobia and visual disturbance.  Respiratory: Negative for cough, chest tightness, shortness of breath, wheezing and stridor.   Cardiovascular: Negative for chest pain, palpitations and leg swelling.  Gastrointestinal: Negative for nausea, vomiting, abdominal pain, diarrhea, constipation, blood in stool, abdominal distention and anal bleeding.  Genitourinary: Negative for dysuria, hematuria, flank pain and difficulty urinating.  Musculoskeletal: Negative for myalgias, back pain, joint swelling, arthralgias and gait problem.  Skin: Negative for color change, pallor, rash and wound.  Neurological: Negative for dizziness, tremors, weakness and light-headedness.  Hematological: Negative for adenopathy. Does not bruise/bleed easily.  Psychiatric/Behavioral: Negative for behavioral problems, confusion, sleep disturbance, dysphoric mood, decreased concentration and  agitation.       Objective:   Physical Exam  Constitutional: He is oriented to person, place, and time. He appears well-developed and well-nourished.  HENT:  Head: Normocephalic and atraumatic.  Mouth/Throat: Dental caries present.    Eyes: Conjunctivae and EOM are normal.  Neck: Normal range of motion. Neck supple.  Cardiovascular: Normal rate and regular rhythm.   Pulmonary/Chest: Effort normal. No respiratory distress. He has no wheezes.  Abdominal: Soft. He exhibits no distension.  Musculoskeletal: Normal range of motion. He exhibits no edema or tenderness.  Neurological: He is alert and oriented to person, place, and time.  Skin: Skin is warm and dry. No rash noted. No erythema. No pallor.          Assessment & Plan:  HIV: continue Atripla, check labs today  Dermatitis: continue prn triamcinolone  Dementia: undoubtedly still with  cognitive deficits and I DON'T THINK HE IS COMPETENT TO CARE FOR HIMSELF.  Need psych eval to see what they thought re his competency. i have asked his facility to make sure they let psychiatrist know about cognitive deficits he has and behavioral problems he has had. I spent greater than 25 minutes with the patient including greater than 50% of time in face to face counsel of the patient and in coordination of their care.     Etohism: still binge drinking when he goes off with his friends. He has told staff he wants [place]

## 2014-02-10 NOTE — Addendum Note (Signed)
Addended by: Lurlean LeydenPOOLE, TRAVIS F on: 02/10/2014 02:02 PM   Modules accepted: Orders

## 2014-02-11 LAB — T-HELPER CELL (CD4) - (RCID CLINIC ONLY)
CD4 % Helper T Cell: 20 % — ABNORMAL LOW (ref 33–55)
CD4 T Cell Abs: 270 /uL — ABNORMAL LOW (ref 400–2700)

## 2014-02-11 LAB — HIV-1 RNA ULTRAQUANT REFLEX TO GENTYP+
HIV 1 RNA Quant: <20 copies/mL (ref <20)
HIV-1 RNA Quant, Log: <1.3 {Log} (ref <1.30)

## 2014-03-07 ENCOUNTER — Telehealth: Payer: Self-pay | Admitting: *Deleted

## 2014-03-07 NOTE — Telephone Encounter (Signed)
Is there any significant implication of his using marijuana? I did not think even police were prosecuting it anymore

## 2014-03-07 NOTE — Telephone Encounter (Signed)
This is just an [place]. Supervisor, Brian NestKola Marquez [facility] called to let Brian Marquez know that he has been using marijuana. They smelled it and confronted patient and said that social services was going to be notified. Wendall MolaJacqueline Cockerham CMA

## 2014-03-12 DIAGNOSIS — F0390 Unspecified dementia without behavioral disturbance: Secondary | ICD-10-CM | POA: Diagnosis not present

## 2014-03-12 DIAGNOSIS — B20 Human immunodeficiency virus [HIV] disease: Secondary | ICD-10-CM | POA: Diagnosis not present

## 2014-03-12 DIAGNOSIS — D649 Anemia, unspecified: Secondary | ICD-10-CM | POA: Diagnosis not present

## 2014-03-24 ENCOUNTER — Other Ambulatory Visit: Payer: Self-pay | Admitting: Infectious Disease

## 2014-03-24 DIAGNOSIS — B20 Human immunodeficiency virus [HIV] disease: Secondary | ICD-10-CM

## 2014-04-28 DIAGNOSIS — B20 Human immunodeficiency virus [HIV] disease: Secondary | ICD-10-CM | POA: Diagnosis not present

## 2014-05-26 DIAGNOSIS — B20 Human immunodeficiency virus [HIV] disease: Secondary | ICD-10-CM | POA: Diagnosis not present

## 2014-06-16 ENCOUNTER — Encounter: Payer: Self-pay | Admitting: Infectious Disease

## 2014-06-16 ENCOUNTER — Ambulatory Visit (INDEPENDENT_AMBULATORY_CARE_PROVIDER_SITE_OTHER): Payer: Medicare Other | Admitting: Infectious Disease

## 2014-06-16 ENCOUNTER — Other Ambulatory Visit (HOSPITAL_COMMUNITY)
Admission: RE | Admit: 2014-06-16 | Discharge: 2014-06-16 | Disposition: A | Discharge status: Home / Self Care | Payer: Medicare Other | Source: Ambulatory Visit | Attending: Infectious Disease | Admitting: Infectious Disease

## 2014-06-16 VITALS — BP 128/84 | HR 71 | Temp 97.8°F | Wt 136.5 lb

## 2014-06-16 DIAGNOSIS — F17200 Nicotine dependence, unspecified, uncomplicated: Secondary | ICD-10-CM

## 2014-06-16 DIAGNOSIS — Z720 Tobacco use: Secondary | ICD-10-CM

## 2014-06-16 DIAGNOSIS — B20 Human immunodeficiency virus [HIV] disease: Secondary | ICD-10-CM

## 2014-06-16 DIAGNOSIS — F0280 Dementia in other diseases classified elsewhere without behavioral disturbance: Secondary | ICD-10-CM

## 2014-06-16 DIAGNOSIS — Z113 Encounter for screening for infections with a predominantly sexual mode of transmission: Secondary | ICD-10-CM | POA: Insufficient documentation

## 2014-06-16 DIAGNOSIS — A812 Progressive multifocal leukoencephalopathy: Secondary | ICD-10-CM | POA: Diagnosis not present

## 2014-06-16 DIAGNOSIS — F1910 Other psychoactive substance abuse, uncomplicated: Secondary | ICD-10-CM

## 2014-06-16 LAB — COMPLETE METABOLIC PANEL WITH GFR
ALT: 15 U/L (ref 0–53)
AST: 20 U/L (ref 0–37)
Albumin: 4 g/dL (ref 3.5–5.2)
Alkaline Phosphatase: 81 U/L (ref 39–117)
BILIRUBIN TOTAL: 0.3 mg/dL (ref 0.2–1.2)
BUN: 12 mg/dL (ref 6–23)
CALCIUM: 9 mg/dL (ref 8.4–10.5)
CHLORIDE: 108 meq/L (ref 96–112)
CO2: 26 mEq/L (ref 19–32)
CREATININE: 1.24 mg/dL (ref 0.50–1.35)
GFR, Est African American: 76 mL/min
GFR, Est Non African American: 65 mL/min
Glucose, Bld: 74 mg/dL (ref 70–99)
Potassium: 4.5 mEq/L (ref 3.5–5.3)
SODIUM: 139 meq/L (ref 135–145)
TOTAL PROTEIN: 6.4 g/dL (ref 6.0–8.3)

## 2014-06-16 LAB — CBC WITH DIFFERENTIAL/PLATELET
Basophils Absolute: 0 10*3/uL (ref 0.0–0.1)
Basophils Relative: 0 % (ref 0–1)
EOS ABS: 0.1 10*3/uL (ref 0.0–0.7)
Eosinophils Relative: 1 % (ref 0–5)
HCT: 43.8 % (ref 39.0–52.0)
Hemoglobin: 15.2 g/dL (ref 13.0–17.0)
Lymphocytes Relative: 15 % (ref 12–46)
Lymphs Abs: 1.2 10*3/uL (ref 0.7–4.0)
MCH: 34.5 pg — AB (ref 26.0–34.0)
MCHC: 34.7 g/dL (ref 30.0–36.0)
MCV: 99.3 fL (ref 78.0–100.0)
MPV: 8.9 fL (ref 8.6–12.4)
Monocytes Absolute: 0.6 10*3/uL (ref 0.1–1.0)
Monocytes Relative: 8 % (ref 3–12)
Neutro Abs: 6 10*3/uL (ref 1.7–7.7)
Neutrophils Relative %: 76 % (ref 43–77)
Platelets: 203 10*3/uL (ref 150–400)
RBC: 4.41 MIL/uL (ref 4.22–5.81)
RDW: 14.6 % (ref 11.5–15.5)
WBC: 7.9 10*3/uL (ref 4.0–10.5)

## 2014-06-16 MED ORDER — ABACAVIR-DOLUTEGRAVIR-LAMIVUD 600-50-300 MG PO TABS: 1.0000 | ORAL_TABLET | Freq: Every day | ORAL | Status: DC

## 2014-06-16 NOTE — Progress Notes (Signed)
  Subjective:    Patient ID: Brian Marquez, male    DOB: September 15, 1960, 54 y.o.   MRN: [id number]  HPI   54 year old Caucasian man with HIV/AIDS, prior polysubstance abuse in the past HIV encephalopathy still living in ALF facility. Phone number is  [phone]. He has been compliant with atripla and last VL undetectable. CD4  Is above 200. He is taking intermittent triamcinolone for rash.   We had discussions re changing him to an non TNF regimen and today he wanted to go [facility] though he had to ask me about 7 times what the name was (I dont think he can read the name on the sample I showed him)     Review of Systems  Constitutional: Negative for fever, chills, diaphoresis, activity change, appetite change, fatigue and unexpected weight change.  HENT: Negative for congestion, rhinorrhea, sinus pressure, sneezing, sore throat and trouble swallowing.   Eyes: Negative for photophobia and visual disturbance.  Respiratory: Negative for cough, chest tightness, shortness of breath, wheezing and stridor.   Cardiovascular: Negative for chest pain, palpitations and leg swelling.  Gastrointestinal: Negative for nausea, vomiting, abdominal pain, diarrhea, constipation, blood in stool, abdominal distention and anal bleeding.  Genitourinary: Negative for dysuria, hematuria, flank pain and difficulty urinating.  Musculoskeletal: Negative for myalgias, back pain, joint swelling, arthralgias and gait problem.  Skin: Negative for color change, pallor, rash and wound.  Neurological: Negative for dizziness, tremors, weakness and light-headedness.  Hematological: Negative for adenopathy. Does not bruise/bleed easily.  Psychiatric/Behavioral: Negative for behavioral problems, confusion, sleep disturbance, dysphoric mood, decreased concentration and agitation.       Objective:   Physical Exam  Constitutional: He is oriented to person, place, and time. He appears well-developed and well-nourished.    HENT:  Head: Normocephalic and atraumatic.  Mouth/Throat: Dental caries present.    Eyes: Conjunctivae and EOM are normal.  Neck: Normal range of motion. Neck supple.  Cardiovascular: Normal rate and regular rhythm.   Pulmonary/Chest: Effort normal. No respiratory distress. He has no wheezes.  Abdominal: Soft. He exhibits no distension.  Musculoskeletal: Normal range of motion. He exhibits no edema or tenderness.  Neurological: He is alert and oriented to person, place, and time.  Skin: Skin is warm and dry. No rash noted. No erythema. No pallor.          Assessment & Plan:  HIV:  check labs today and [facility] and check labs in 2 months  Dermatitis: continue prn triamcinolone  Dementia: undoubtedly still with  cognitive deficits and I DON'T THINK HE IS COMPETENT TO CARE FOR HIMSELF. He had PML in the hospital that was severe but responded to ARV  Need psych eval to see what they thought re his competency. i have asked his facility to make sure they let psychiatrist know about cognitive deficits he has and behavioral problems he has had. I spent greater than 25 minutes with the patient including greater than 50% of time in face to face counsel of the patient and in coordination of their care.    Etohism: still binge drinking when he goes off with his friends. He has told staff he wants to go [place]  Smoking: still smoking, counselled to quit

## 2014-06-17 LAB — URINE CYTOLOGY ANCILLARY ONLY
Chlamydia: NEGATIVE
NEISSERIA GONORRHEA: NEGATIVE

## 2014-06-17 LAB — T-HELPER CELL (CD4) - (RCID CLINIC ONLY)
CD4 T CELL HELPER: 21 % — AB (ref 33–55)
CD4 T Cell Abs: 230 /uL — ABNORMAL LOW (ref 400–2700)

## 2014-06-17 LAB — RPR

## 2014-06-19 LAB — HIV-1 RNA QUANT-NO REFLEX-BLD
HIV 1 RNA Quant: <20 copies/mL (ref <20)
HIV-1 RNA Quant, Log: <1.3 {Log} (ref <1.30)

## 2014-06-21 DIAGNOSIS — H25813 Combined forms of age-related cataract, bilateral: Secondary | ICD-10-CM | POA: Diagnosis not present

## 2014-06-23 DIAGNOSIS — B20 Human immunodeficiency virus [HIV] disease: Secondary | ICD-10-CM | POA: Diagnosis not present

## 2014-07-16 ENCOUNTER — Telehealth: Payer: Self-pay | Admitting: *Deleted

## 2014-07-16 ENCOUNTER — Other Ambulatory Visit: Payer: Self-pay | Admitting: *Deleted

## 2014-07-16 DIAGNOSIS — B20 Human immunodeficiency virus [HIV] disease: Secondary | ICD-10-CM

## 2014-07-16 MED ORDER — TRIAMCINOLONE ACETONIDE 0.5 % EX CREA
TOPICAL_CREAM | CUTANEOUS | Status: DC
Start: 2014-07-16 — End: 2015-01-28

## 2014-07-16 NOTE — Telephone Encounter (Signed)
[facility]  He needs an order, per Lyman BishopNannie Holt, to be able to self-administer the triamcinolone cream as directed.  Nannie requesting this physician's order be faxed to both [facility] [phone]) and the pharmacy [phone]).  OK per Dr. Daiva EvesVan Dam.  Orders signed and faxed. Andree CossHowell, Saori Umholtz M, RN

## 2014-07-23 DIAGNOSIS — B20 Human immunodeficiency virus [HIV] disease: Secondary | ICD-10-CM | POA: Diagnosis not present

## 2014-08-18 ENCOUNTER — Ambulatory Visit (INDEPENDENT_AMBULATORY_CARE_PROVIDER_SITE_OTHER): Payer: Medicare Other | Admitting: Infectious Disease

## 2014-08-18 ENCOUNTER — Other Ambulatory Visit (HOSPITAL_COMMUNITY)
Admission: RE | Admit: 2014-08-18 | Discharge: 2014-08-18 | Disposition: A | Discharge status: Home / Self Care | Payer: Medicare Other | Source: Ambulatory Visit | Attending: Infectious Disease | Admitting: Infectious Disease

## 2014-08-18 ENCOUNTER — Encounter: Payer: Self-pay | Admitting: Infectious Disease

## 2014-08-18 VITALS — BP 121/77 | HR 56 | Temp 98.2°F | Wt 147.0 lb

## 2014-08-18 DIAGNOSIS — F0280 Dementia in other diseases classified elsewhere without behavioral disturbance: Secondary | ICD-10-CM

## 2014-08-18 DIAGNOSIS — Z720 Tobacco use: Secondary | ICD-10-CM | POA: Diagnosis not present

## 2014-08-18 DIAGNOSIS — B20 Human immunodeficiency virus [HIV] disease: Secondary | ICD-10-CM

## 2014-08-18 DIAGNOSIS — A812 Progressive multifocal leukoencephalopathy: Secondary | ICD-10-CM | POA: Diagnosis not present

## 2014-08-18 DIAGNOSIS — F1020 Alcohol dependence, uncomplicated: Secondary | ICD-10-CM

## 2014-08-18 DIAGNOSIS — E785 Hyperlipidemia, unspecified: Secondary | ICD-10-CM | POA: Diagnosis not present

## 2014-08-18 DIAGNOSIS — Z113 Encounter for screening for infections with a predominantly sexual mode of transmission: Secondary | ICD-10-CM | POA: Diagnosis not present

## 2014-08-18 DIAGNOSIS — F17200 Nicotine dependence, unspecified, uncomplicated: Secondary | ICD-10-CM

## 2014-08-18 HISTORY — DX: Alcohol dependence, uncomplicated: F10.20

## 2014-08-18 LAB — COMPLETE METABOLIC PANEL WITH GFR
ALT: 9 U/L (ref 0–53)
AST: 16 U/L (ref 0–37)
Albumin: 4 g/dL (ref 3.5–5.2)
Alkaline Phosphatase: 61 U/L (ref 39–117)
BILIRUBIN TOTAL: 0.3 mg/dL (ref 0.2–1.2)
BUN: 10 mg/dL (ref 6–23)
CO2: 26 mEq/L (ref 19–32)
CREATININE: 1.21 mg/dL (ref 0.50–1.35)
Calcium: 9.4 mg/dL (ref 8.4–10.5)
Chloride: 106 mEq/L (ref 96–112)
GFR, Est African American: 78 mL/min
GFR, Est Non African American: 67 mL/min
Glucose, Bld: 73 mg/dL (ref 70–99)
Potassium: 4 mEq/L (ref 3.5–5.3)
Sodium: 139 mEq/L (ref 135–145)
Total Protein: 6.3 g/dL (ref 6.0–8.3)

## 2014-08-18 LAB — CBC WITH DIFFERENTIAL/PLATELET
BASOS ABS: 0 10*3/uL (ref 0.0–0.1)
BASOS PCT: 0 % (ref 0–1)
EOS ABS: 0.1 10*3/uL (ref 0.0–0.7)
Eosinophils Relative: 1 % (ref 0–5)
HEMATOCRIT: 42 % (ref 39.0–52.0)
Hemoglobin: 14.2 g/dL (ref 13.0–17.0)
Lymphocytes Relative: 19 % (ref 12–46)
Lymphs Abs: 1.3 10*3/uL (ref 0.7–4.0)
MCH: 34.3 pg — AB (ref 26.0–34.0)
MCHC: 33.8 g/dL (ref 30.0–36.0)
MCV: 101.4 fL — ABNORMAL HIGH (ref 78.0–100.0)
MPV: 9 fL (ref 8.6–12.4)
Monocytes Absolute: 0.7 10*3/uL (ref 0.1–1.0)
Monocytes Relative: 10 % (ref 3–12)
NEUTROS PCT: 70 % (ref 43–77)
Neutro Abs: 4.7 10*3/uL (ref 1.7–7.7)
Platelets: 233 10*3/uL (ref 150–400)
RBC: 4.14 MIL/uL — ABNORMAL LOW (ref 4.22–5.81)
RDW: 14.2 % (ref 11.5–15.5)
WBC: 6.7 10*3/uL (ref 4.0–10.5)

## 2014-08-18 LAB — LIPID PANEL
CHOLESTEROL: 171 mg/dL (ref 0–200)
HDL: 51 mg/dL (ref >=40)
LDL Cholesterol: 97 mg/dL (ref 0–99)
Total CHOL/HDL Ratio: 3.4 Ratio
Triglycerides: 116 mg/dL (ref <150)
VLDL: 23 mg/dL (ref 0–40)

## 2014-08-18 LAB — RPR

## 2014-08-18 NOTE — Progress Notes (Signed)
  Subjective:    Patient ID: Brian Marquez, male    DOB: 07/27/1960, 54 y.o.   MRN: [id number]  HPI   54 year old Caucasian man with HIV/AIDS, prior polysubstance abuse in the past HIV encephalopathy still living in ALF facility. Phone number is  [phone]. He has been compliant with atripla and last VL undetectable. CD4  Is above 200. He is taking intermittent triamcinolone for rash.   We had discussions re changing him to an non TNF regimen and today he wanted to go with [facility]   He stated for the umteenth time his intention to be evalauted yet again by psychiatry so he could leave ALF and find his own place or live under a bridge.  Staff from ALF state that patient WILL NOT take his ARVs unless they keep prompting reminding reinforcing his need to take them.  He also continues to go on drinking binges with his friends.  He is as always not interested is stopping smoking.   Review of Systems  Constitutional: Negative for fever, chills, diaphoresis, activity change, appetite change, fatigue and unexpected weight change.  HENT: Negative for congestion, rhinorrhea, sinus pressure, sneezing, sore throat and trouble swallowing.   Eyes: Negative for photophobia and visual disturbance.  Respiratory: Negative for cough, chest tightness, shortness of breath, wheezing and stridor.   Cardiovascular: Negative for chest pain, palpitations and leg swelling.  Gastrointestinal: Negative for nausea, vomiting, abdominal pain, diarrhea, constipation, blood in stool, abdominal distention and anal bleeding.  Genitourinary: Negative for dysuria, hematuria, flank pain and difficulty urinating.  Musculoskeletal: Negative for myalgias, back pain, joint swelling, arthralgias and gait problem.  Skin: Negative for color change, pallor, rash and wound.  Neurological: Negative for dizziness, tremors, weakness and light-headedness.  Hematological: Negative for adenopathy. Does not bruise/bleed easily.    Psychiatric/Behavioral: Negative for behavioral problems, confusion, sleep disturbance, dysphoric mood, decreased concentration and agitation.       Objective:   Physical Exam  Constitutional: He is oriented to person, place, and time. He appears well-developed and well-nourished.  HENT:  Head: Normocephalic and atraumatic.  Mouth/Throat: Dental caries present.    Eyes: Conjunctivae and EOM are normal.  Neck: Normal range of motion. Neck supple.  Cardiovascular: Normal rate and regular rhythm.   Pulmonary/Chest: Effort normal. No respiratory distress. He has no wheezes.  Abdominal: Soft. He exhibits no distension.  Musculoskeletal: Normal range of motion. He exhibits no edema or tenderness.  Neurological: He is alert and oriented to person, place, and time.  Skin: Skin is warm and dry. No rash noted. No erythema. No pallor.          Assessment & Plan:  HIV:   continue TRIUMEQ and check labs today  And RTC in 3 months  Dermatitis: continue prn triamcinolone  Dementia: undoubtedly still with  cognitive deficits and I DON'T THINK HE IS COMPETENT TO CARE FOR HIMSELF. He had PML in the hospital that was severe but responded to ARV  Need psych eval to see what they thought re his competency. i have asked his facility to make sure they let psychiatrist know about cognitive deficits he has and behavioral problems he has had.     Etohism: still binge drinking when he goes off with his friends. He has told staff he wants to go back [place]  Smoking: still smoking, counselled to quit

## 2014-08-19 LAB — T-HELPER CELL (CD4) - (RCID CLINIC ONLY)
CD4 % Helper T Cell: 21 % — ABNORMAL LOW (ref 33–55)
CD4 T Cell Abs: 290 /uL — ABNORMAL LOW (ref 400–2700)

## 2014-08-19 LAB — URINE CYTOLOGY ANCILLARY ONLY
Chlamydia: NEGATIVE
NEISSERIA GONORRHEA: NEGATIVE

## 2014-08-20 LAB — HIV-1 RNA QUANT-NO REFLEX-BLD
HIV 1 RNA Quant: <20 copies/mL (ref <20)
HIV-1 RNA Quant, Log: <1.3 {Log} (ref <1.30)

## 2014-08-27 DIAGNOSIS — B20 Human immunodeficiency virus [HIV] disease: Secondary | ICD-10-CM | POA: Diagnosis not present

## 2014-09-22 DIAGNOSIS — B20 Human immunodeficiency virus [HIV] disease: Secondary | ICD-10-CM | POA: Diagnosis not present

## 2014-10-21 DIAGNOSIS — J449 Chronic obstructive pulmonary disease, unspecified: Secondary | ICD-10-CM | POA: Diagnosis not present

## 2014-10-21 DIAGNOSIS — B20 Human immunodeficiency virus [HIV] disease: Secondary | ICD-10-CM | POA: Diagnosis not present

## 2014-11-25 ENCOUNTER — Encounter: Payer: Self-pay | Admitting: Infectious Disease

## 2014-11-25 ENCOUNTER — Ambulatory Visit (INDEPENDENT_AMBULATORY_CARE_PROVIDER_SITE_OTHER): Payer: Medicare Other | Admitting: Infectious Disease

## 2014-11-25 VITALS — BP 115/75 | HR 68 | Temp 98.2°F | Ht 67.0 in | Wt 132.0 lb

## 2014-11-25 DIAGNOSIS — Z720 Tobacco use: Secondary | ICD-10-CM

## 2014-11-25 DIAGNOSIS — G9340 Encephalopathy, unspecified: Secondary | ICD-10-CM

## 2014-11-25 DIAGNOSIS — J449 Chronic obstructive pulmonary disease, unspecified: Secondary | ICD-10-CM | POA: Diagnosis not present

## 2014-11-25 DIAGNOSIS — F1020 Alcohol dependence, uncomplicated: Secondary | ICD-10-CM

## 2014-11-25 DIAGNOSIS — B20 Human immunodeficiency virus [HIV] disease: Secondary | ICD-10-CM | POA: Diagnosis not present

## 2014-11-25 DIAGNOSIS — A812 Progressive multifocal leukoencephalopathy: Secondary | ICD-10-CM | POA: Diagnosis not present

## 2014-11-25 DIAGNOSIS — F17200 Nicotine dependence, unspecified, uncomplicated: Secondary | ICD-10-CM

## 2014-11-25 DIAGNOSIS — F0280 Dementia in other diseases classified elsewhere without behavioral disturbance: Secondary | ICD-10-CM

## 2014-11-25 HISTORY — DX: Chronic obstructive pulmonary disease, unspecified: J44.9

## 2014-11-25 LAB — CBC WITH DIFFERENTIAL/PLATELET
Basophils Absolute: 0.1 10*3/uL (ref 0.0–0.1)
Basophils Relative: 1 % (ref 0–1)
EOS ABS: 0.1 10*3/uL (ref 0.0–0.7)
Eosinophils Relative: 1 % (ref 0–5)
HCT: 43.6 % (ref 39.0–52.0)
Hemoglobin: 15.4 g/dL (ref 13.0–17.0)
LYMPHS ABS: 1.1 10*3/uL (ref 0.7–4.0)
Lymphocytes Relative: 17 % (ref 12–46)
MCH: 35.5 pg — ABNORMAL HIGH (ref 26.0–34.0)
MCHC: 35.3 g/dL (ref 30.0–36.0)
MCV: 100.5 fL — AB (ref 78.0–100.0)
MONOS PCT: 7 % (ref 3–12)
MPV: 9.1 fL (ref 8.6–12.4)
Monocytes Absolute: 0.4 10*3/uL (ref 0.1–1.0)
Neutro Abs: 4.7 10*3/uL (ref 1.7–7.7)
Neutrophils Relative %: 74 % (ref 43–77)
Platelets: 210 10*3/uL (ref 150–400)
RBC: 4.34 MIL/uL (ref 4.22–5.81)
RDW: 14.1 % (ref 11.5–15.5)
WBC: 6.4 10*3/uL (ref 4.0–10.5)

## 2014-11-25 LAB — COMPLETE METABOLIC PANEL WITH GFR
ALBUMIN: 4.2 g/dL (ref 3.6–5.1)
ALT: 8 U/L — AB (ref 9–46)
AST: 17 U/L (ref 10–35)
Alkaline Phosphatase: 57 U/L (ref 40–115)
BILIRUBIN TOTAL: 0.3 mg/dL (ref 0.2–1.2)
BUN: 9 mg/dL (ref 7–25)
CO2: 27 mmol/L (ref 20–31)
CREATININE: 1.24 mg/dL (ref 0.70–1.33)
Calcium: 10 mg/dL (ref 8.6–10.3)
Chloride: 106 mmol/L (ref 98–110)
GFR, Est African American: 76 mL/min (ref >=60)
GFR, Est Non African American: 65 mL/min (ref >=60)
GLUCOSE: 88 mg/dL (ref 65–99)
Potassium: 4.9 mmol/L (ref 3.5–5.3)
SODIUM: 137 mmol/L (ref 135–146)
TOTAL PROTEIN: 6.8 g/dL (ref 6.1–8.1)

## 2014-11-25 LAB — RPR

## 2014-11-25 NOTE — Progress Notes (Signed)
  Subjective:    Patient ID: Brian Marquez, male    DOB: 11/10/1960, 54 y.o.   MRN: [id number]  HPI   54 year old Caucasian man with HIV/AIDS, prior polysubstance abuse in the past HIV encephalopathy still living in ALF facility. Phone number is  [phone]. He has been compliant with atripla and last VL undetectable. CD4  Is above 200.    He YET again stated his intention to be evalauted yet again by psychiatry so he could leave ALF and find his own place.  Staff from ALF have int he past  stated that patient WILL NOT take his ARVs unless they keep prompting reminding reinforcing his need to take them.    Review of Systems  Constitutional: Negative for fever, chills, diaphoresis, activity change, appetite change, fatigue and unexpected weight change.  HENT: Negative for congestion, rhinorrhea, sinus pressure, sneezing, sore throat and trouble swallowing.   Eyes: Negative for photophobia and visual disturbance.  Respiratory: Negative for cough, chest tightness, shortness of breath, wheezing and stridor.   Cardiovascular: Negative for chest pain, palpitations and leg swelling.  Gastrointestinal: Negative for nausea, vomiting, abdominal pain, diarrhea, constipation, blood in stool, abdominal distention and anal bleeding.  Genitourinary: Negative for dysuria, hematuria, flank pain and difficulty urinating.  Musculoskeletal: Negative for myalgias, back pain, joint swelling, arthralgias and gait problem.  Skin: Negative for color change, pallor, rash and wound.  Neurological: Negative for dizziness, tremors, weakness and light-headedness.  Hematological: Negative for adenopathy. Does not bruise/bleed easily.  Psychiatric/Behavioral: Negative for behavioral problems, confusion, sleep disturbance, dysphoric mood, decreased concentration and agitation.       Objective:   Physical Exam  Constitutional: He is oriented to person, place, and time. He appears well-developed and  well-nourished.  HENT:  Head: Normocephalic and atraumatic.  Mouth/Throat: Dental caries present.    Eyes: Conjunctivae and EOM are normal.  Neck: Normal range of motion. Neck supple.  Cardiovascular: Normal rate, regular rhythm and normal heart sounds.   Pulmonary/Chest: Effort normal and breath sounds normal. No respiratory distress. He has no wheezes. He has no rales. He exhibits no tenderness.  Abdominal: Soft. He exhibits no distension.  Musculoskeletal: Normal range of motion. He exhibits no edema or tenderness.  Neurological: He is alert and oriented to person, place, and time.  Skin: Skin is warm and dry. No rash noted. No erythema. No pallor.  Psychiatric: Cognition and memory are impaired.          Assessment & Plan:  HIV:   continue TRIUMEQ and check labs today  And RTC in 4 months   Dementia: undoubtedly still with  cognitive deficits and I DON'T THINK HE IS COMPETENT TO CARE FOR HIMSELF. He had PML in the hospital that was severe but responded to ARV  Need psych eval to see what they thought re his competency. i have asked his facility to make sure they let psychiatrist know about cognitive deficits he has and behavioral problems he has had. The fact that he has had the same conversation about leaving the ALF for many years now but always ina cheerful disposition makes me wonder about his memory and his capacity  Etohism: still binge drinking when he goes off with his friends.   Smoking: still smoking, counselled to quit  COPD: NP at the SNF had noted obstructive lung pattern on PFTS but pt not on bronchodilators. Does he need formal pulmonary evalution?

## 2014-11-26 LAB — HIV-1 RNA QUANT-NO REFLEX-BLD: HIV 1 RNA Quant: <20 copies/mL (ref <20)

## 2014-11-27 LAB — T-HELPER CELL (CD4) - (RCID CLINIC ONLY)
CD4 % Helper T Cell: 27 % — ABNORMAL LOW (ref 33–55)
CD4 T Cell Abs: 260 /uL — ABNORMAL LOW (ref 400–2700)

## 2014-12-22 DIAGNOSIS — B20 Human immunodeficiency virus [HIV] disease: Secondary | ICD-10-CM | POA: Diagnosis not present

## 2014-12-22 DIAGNOSIS — J449 Chronic obstructive pulmonary disease, unspecified: Secondary | ICD-10-CM | POA: Diagnosis not present

## 2015-01-27 DIAGNOSIS — B20 Human immunodeficiency virus [HIV] disease: Secondary | ICD-10-CM | POA: Diagnosis not present

## 2015-01-27 DIAGNOSIS — J449 Chronic obstructive pulmonary disease, unspecified: Secondary | ICD-10-CM | POA: Diagnosis not present

## 2015-01-28 ENCOUNTER — Other Ambulatory Visit: Payer: Self-pay | Admitting: Infectious Disease

## 2015-01-28 DIAGNOSIS — L309 Dermatitis, unspecified: Secondary | ICD-10-CM

## 2015-02-05 DIAGNOSIS — H25813 Combined forms of age-related cataract, bilateral: Secondary | ICD-10-CM | POA: Diagnosis not present

## 2015-02-25 DIAGNOSIS — B20 Human immunodeficiency virus [HIV] disease: Secondary | ICD-10-CM | POA: Diagnosis not present

## 2015-02-25 DIAGNOSIS — J449 Chronic obstructive pulmonary disease, unspecified: Secondary | ICD-10-CM | POA: Diagnosis not present

## 2015-03-27 DIAGNOSIS — B20 Human immunodeficiency virus [HIV] disease: Secondary | ICD-10-CM | POA: Diagnosis not present

## 2015-03-27 DIAGNOSIS — J449 Chronic obstructive pulmonary disease, unspecified: Secondary | ICD-10-CM | POA: Diagnosis not present

## 2015-03-30 ENCOUNTER — Ambulatory Visit: Payer: Medicare Other | Admitting: Infectious Disease

## 2015-03-31 ENCOUNTER — Ambulatory Visit (INDEPENDENT_AMBULATORY_CARE_PROVIDER_SITE_OTHER): Payer: Medicare Other | Admitting: Infectious Disease

## 2015-03-31 ENCOUNTER — Encounter: Payer: Self-pay | Admitting: Infectious Disease

## 2015-03-31 VITALS — BP 125/78 | HR 81 | Temp 97.8°F | Ht 68.0 in | Wt 130.0 lb

## 2015-03-31 DIAGNOSIS — I10 Essential (primary) hypertension: Secondary | ICD-10-CM

## 2015-03-31 DIAGNOSIS — G9340 Encephalopathy, unspecified: Secondary | ICD-10-CM

## 2015-03-31 DIAGNOSIS — J449 Chronic obstructive pulmonary disease, unspecified: Secondary | ICD-10-CM | POA: Diagnosis not present

## 2015-03-31 DIAGNOSIS — B20 Human immunodeficiency virus [HIV] disease: Secondary | ICD-10-CM

## 2015-03-31 DIAGNOSIS — E785 Hyperlipidemia, unspecified: Secondary | ICD-10-CM | POA: Diagnosis not present

## 2015-03-31 DIAGNOSIS — F0280 Dementia in other diseases classified elsewhere without behavioral disturbance: Secondary | ICD-10-CM

## 2015-03-31 DIAGNOSIS — F1010 Alcohol abuse, uncomplicated: Secondary | ICD-10-CM

## 2015-03-31 DIAGNOSIS — A812 Progressive multifocal leukoencephalopathy: Secondary | ICD-10-CM | POA: Diagnosis not present

## 2015-03-31 DIAGNOSIS — F17200 Nicotine dependence, unspecified, uncomplicated: Secondary | ICD-10-CM

## 2015-03-31 HISTORY — DX: Alcohol abuse, uncomplicated: F10.10

## 2015-03-31 LAB — CBC WITH DIFFERENTIAL/PLATELET
BASOS PCT: 0 % (ref 0–1)
Basophils Absolute: 0 10*3/uL (ref 0.0–0.1)
EOS ABS: 0.1 10*3/uL (ref 0.0–0.7)
Eosinophils Relative: 1 % (ref 0–5)
HCT: 44.4 % (ref 39.0–52.0)
HEMOGLOBIN: 15.3 g/dL (ref 13.0–17.0)
Lymphocytes Relative: 18 % (ref 12–46)
Lymphs Abs: 1.2 10*3/uL (ref 0.7–4.0)
MCH: 35.1 pg — ABNORMAL HIGH (ref 26.0–34.0)
MCHC: 34.5 g/dL (ref 30.0–36.0)
MCV: 101.8 fL — ABNORMAL HIGH (ref 78.0–100.0)
MONO ABS: 0.4 10*3/uL (ref 0.1–1.0)
MPV: 8.7 fL (ref 8.6–12.4)
Monocytes Relative: 6 % (ref 3–12)
NEUTROS ABS: 5.2 10*3/uL (ref 1.7–7.7)
NEUTROS PCT: 75 % (ref 43–77)
PLATELETS: 234 10*3/uL (ref 150–400)
RBC: 4.36 MIL/uL (ref 4.22–5.81)
RDW: 14 % (ref 11.5–15.5)
WBC: 6.9 10*3/uL (ref 4.0–10.5)

## 2015-03-31 NOTE — Addendum Note (Signed)
Addended by: Mariea Clonts D on: 03/31/2015 03:27 PM   Modules accepted: Orders

## 2015-03-31 NOTE — Progress Notes (Signed)
Chief complaint: followup for HIV disease on medications Subjective:    Patient ID: Brian Marquez, male    DOB: [birth year], 55 y.o.   MRN: [id number]  HPI   [name] with HIV/AIDS, prior polysubstance abuse in the past HIV encephalopathy still living in ALF facility. Phone number is  [phone]. He has been compliant with TRIUMEQ and last VL undetectable. CD4  Is above 200.   This is the first vsiit that I can recall where he has not mentoned wanting to leave the facility.  He is on Advair for RAD but no Beta agonist. He is bicycling, walking exercising fairly vigorously without SOB or wheezing.  He does continue to smoke 1-1.5 packs of cigarettes per day and by his admission a 40 oz of beer per week.  Past Medical History  Diagnosis Date  . HIV infection (HCC)   . 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 Coma (HCC)   . Encephalopathy   . Alcoholism (HCC)   . Alcoholism (HCC) 08/18/2014  . 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 Alcohol abuse 03/31/2015    No past surgical history on file.  No family history on file.    Social History   Social History  . Marital Status: Single    Spouse Name: N/A  . Number of Children: N/A  . Years of Education: N/A   Social History Main Topics  . Smoking status: Current Every Day Smoker -- 1.00 packs/day    Types: Cigarettes  . Smokeless tobacco: Never Used  . Alcohol Use: No  . Drug Use: No  . Sexual Activity: No   Other Topics Concern  . None   Social History Narrative    No Known Allergies   Current outpatient prescriptions:  .  Abacavir-Dolutegravir-Lamivud (TRIUMEQ) 600-50-300 MG TABS, Take 1 tablet by mouth daily., Disp: 30 tablet, Rfl: 11 .  triamcinolone cream (KENALOG) 0.5 %, APPLY AS DIRECTED TO AFFECTED AREA TWICE DAILY AS NEEDED, Disp: 30 g, Rfl: 4    Review of Systems  Constitutional: Negative for fever, chills, diaphoresis, activity change, appetite change, fatigue and unexpected weight change.  HENT:  Negative for congestion, rhinorrhea, sinus pressure, sneezing, sore throat and trouble swallowing.   Eyes: Negative for photophobia and visual disturbance.  Respiratory: Negative for cough, chest tightness, shortness of breath, wheezing and stridor.   Cardiovascular: Negative for chest pain, palpitations and leg swelling.  Gastrointestinal: Negative for nausea, vomiting, abdominal pain, diarrhea, constipation, blood in stool, abdominal distention and anal bleeding.  Genitourinary: Negative for dysuria, hematuria, flank pain and difficulty urinating.  Musculoskeletal: Negative for myalgias, back pain, joint swelling, arthralgias and gait problem.  Skin: Negative for color change, pallor, rash and wound.  Neurological: Negative for dizziness, tremors, weakness and light-headedness.  Hematological: Negative for adenopathy. Does not bruise/bleed easily.  Psychiatric/Behavioral: Negative for behavioral problems, confusion, sleep disturbance, dysphoric mood, decreased concentration and agitation.       Objective:   Physical Exam  Constitutional: He is oriented to person, place, and time. He appears well-developed and well-nourished.  HENT:  Head: Normocephalic and atraumatic.  Mouth/Throat: Dental caries present.    Eyes: Conjunctivae and EOM are normal.  Neck: Normal range of motion. Neck supple.  Cardiovascular: Normal rate, regular rhythm and normal heart sounds.  Exam reveals no gallop and no friction rub.   No murmur heard. Pulmonary/Chest: Effort normal and breath sounds normal. No respiratory distress. He has no wheezes. He has no rales. He exhibits no tenderness.  Abdominal: Soft. Bowel sounds are normal. He exhibits no distension. There is no tenderness.  Musculoskeletal: Normal range of motion. He exhibits no edema or tenderness.  Neurological: He is alert and oriented to person, place, and time.  Skin: Skin is warm and dry. No rash noted. No erythema. No pallor.  Psychiatric: He  has a normal mood and affect. His speech is normal. He is agitated. Cognition and memory are impaired.          Assessment & Plan:  HIV:  continue TRIUMEQ and check labs today  And RTC in 6 months   Dementia: undoubtedly still with  cognitive deficits and I DON'T THINK HE IS COMPETENT TO CARE FOR HIMSELF. He had PML in the hospital that was severe but responded to [facility]. Fortunately he does not appear during this visit to be seeking to move out of ALF  Etohism: still binge drinking when he goes off with his friends per staff. Will check level today but not much we can do about this  Smoking: still smoking, counselled to quit again  COPD: NP at the SNF had noted obstructive lung pattern on PFTS but pt not on bronchodilators. Does he need formal pulmonary evalution? He is currently on advair but not beta agonists  I spent greater than 25 minutes with the patient including greater than 50% of time in face to face counsel of the patient re his HIV, his immune system, his prior comatose state, his alcohol use, cigarette smoking, COPD  and in coordination of his  care.

## 2015-04-01 LAB — COMPLETE METABOLIC PANEL WITH GFR
ALBUMIN: 4.2 g/dL (ref 3.6–5.1)
ALK PHOS: 51 U/L (ref 40–115)
ALT: 7 U/L — ABNORMAL LOW (ref 9–46)
AST: 16 U/L (ref 10–35)
BUN: 12 mg/dL (ref 7–25)
CALCIUM: 9.4 mg/dL (ref 8.6–10.3)
CO2: 25 mmol/L (ref 20–31)
Chloride: 109 mmol/L (ref 98–110)
Creat: 1.24 mg/dL (ref 0.70–1.33)
GFR, EST NON AFRICAN AMERICAN: 65 mL/min (ref >=60)
GFR, Est African American: 75 mL/min (ref >=60)
Glucose, Bld: 92 mg/dL (ref 65–99)
POTASSIUM: 4.4 mmol/L (ref 3.5–5.3)
Sodium: 139 mmol/L (ref 135–146)
Total Bilirubin: 0.3 mg/dL (ref 0.2–1.2)
Total Protein: 6.7 g/dL (ref 6.1–8.1)

## 2015-04-01 LAB — LIPID PANEL
CHOLESTEROL: 201 mg/dL — AB (ref 125–200)
HDL: 48 mg/dL (ref >=40)
LDL Cholesterol: 106 mg/dL (ref <130)
TRIGLYCERIDES: 237 mg/dL — AB (ref <150)
Total CHOL/HDL Ratio: 4.2 Ratio (ref <=5.0)
VLDL: 47 mg/dL — AB (ref <30)

## 2015-04-01 LAB — RPR

## 2015-04-01 LAB — T-HELPER CELL (CD4) - (RCID CLINIC ONLY)
CD4 % Helper T Cell: 29 % — ABNORMAL LOW (ref 33–55)
CD4 T Cell Abs: 360 /uL — ABNORMAL LOW (ref 400–2700)

## 2015-04-02 LAB — ETHANOL: Alcohol, Ethyl (B): 49 mg/dL — ABNORMAL HIGH (ref 0–10)

## 2015-04-03 LAB — HIV RNA, RTPCR W/R GT (RTI, PI,INT)
HIV 1 RNA Quant: <20 copies/mL
HIV-1 RNA Quant, Log: <1.3 Log copies/mL

## 2015-04-22 ENCOUNTER — Other Ambulatory Visit: Payer: Self-pay | Admitting: Infectious Disease

## 2015-04-22 DIAGNOSIS — B20 Human immunodeficiency virus [HIV] disease: Secondary | ICD-10-CM

## 2015-04-27 DIAGNOSIS — J449 Chronic obstructive pulmonary disease, unspecified: Secondary | ICD-10-CM | POA: Diagnosis not present

## 2015-04-27 DIAGNOSIS — B20 Human immunodeficiency virus [HIV] disease: Secondary | ICD-10-CM | POA: Diagnosis not present

## 2015-05-27 DIAGNOSIS — J449 Chronic obstructive pulmonary disease, unspecified: Secondary | ICD-10-CM | POA: Diagnosis not present

## 2015-05-27 DIAGNOSIS — B20 Human immunodeficiency virus [HIV] disease: Secondary | ICD-10-CM | POA: Diagnosis not present

## 2015-08-19 ENCOUNTER — Observation Stay
Admission: EM | Admit: 2015-08-19 | Discharge: 2015-08-20 | Payer: Medicare Other | Attending: Internal Medicine | Admitting: Internal Medicine

## 2015-08-19 ENCOUNTER — Encounter: Payer: Self-pay | Admitting: Emergency Medicine

## 2015-08-19 ENCOUNTER — Emergency Department: Payer: Medicare Other

## 2015-08-19 ENCOUNTER — Inpatient Hospital Stay
Admit: 2015-08-19 | Discharge: 2015-08-19 | Disposition: A | Discharge status: Home / Self Care | Payer: Medicare Other | Attending: Cardiology | Admitting: Cardiology

## 2015-08-19 DIAGNOSIS — G9340 Encephalopathy, unspecified: Secondary | ICD-10-CM | POA: Diagnosis not present

## 2015-08-19 DIAGNOSIS — Z716 Tobacco abuse counseling: Secondary | ICD-10-CM | POA: Diagnosis not present

## 2015-08-19 DIAGNOSIS — R748 Abnormal levels of other serum enzymes: Principal | ICD-10-CM | POA: Insufficient documentation

## 2015-08-19 DIAGNOSIS — B20 Human immunodeficiency virus [HIV] disease: Secondary | ICD-10-CM | POA: Insufficient documentation

## 2015-08-19 DIAGNOSIS — F17210 Nicotine dependence, cigarettes, uncomplicated: Secondary | ICD-10-CM | POA: Insufficient documentation

## 2015-08-19 DIAGNOSIS — R42 Dizziness and giddiness: Secondary | ICD-10-CM | POA: Diagnosis not present

## 2015-08-19 DIAGNOSIS — F1010 Alcohol abuse, uncomplicated: Secondary | ICD-10-CM | POA: Insufficient documentation

## 2015-08-19 DIAGNOSIS — F0280 Dementia in other diseases classified elsewhere without behavioral disturbance: Secondary | ICD-10-CM | POA: Diagnosis not present

## 2015-08-19 DIAGNOSIS — Z79899 Other long term (current) drug therapy: Secondary | ICD-10-CM | POA: Diagnosis not present

## 2015-08-19 DIAGNOSIS — I214 Non-ST elevation (NSTEMI) myocardial infarction: Secondary | ICD-10-CM | POA: Diagnosis present

## 2015-08-19 DIAGNOSIS — J449 Chronic obstructive pulmonary disease, unspecified: Secondary | ICD-10-CM | POA: Diagnosis not present

## 2015-08-19 DIAGNOSIS — E785 Hyperlipidemia, unspecified: Secondary | ICD-10-CM | POA: Diagnosis not present

## 2015-08-19 DIAGNOSIS — Z7951 Long term (current) use of inhaled steroids: Secondary | ICD-10-CM | POA: Diagnosis not present

## 2015-08-19 DIAGNOSIS — R7989 Other specified abnormal findings of blood chemistry: Secondary | ICD-10-CM | POA: Diagnosis not present

## 2015-08-19 DIAGNOSIS — Z21 Asymptomatic human immunodeficiency virus [HIV] infection status: Secondary | ICD-10-CM | POA: Diagnosis not present

## 2015-08-19 DIAGNOSIS — N179 Acute kidney failure, unspecified: Secondary | ICD-10-CM | POA: Diagnosis not present

## 2015-08-19 LAB — PROTIME-INR
INR: 1.03
PROTHROMBIN TIME: 13.7 s (ref 11.4–15.0)

## 2015-08-19 LAB — COMPREHENSIVE METABOLIC PANEL
ALBUMIN: 3.7 g/dL (ref 3.5–5.0)
ALK PHOS: 69 U/L (ref 38–126)
ALT: 11 U/L — ABNORMAL LOW (ref 17–63)
AST: 19 U/L (ref 15–41)
Anion gap: 7 (ref 5–15)
BILIRUBIN TOTAL: 0.5 mg/dL (ref 0.3–1.2)
BUN: 17 mg/dL (ref 6–20)
CALCIUM: 9.7 mg/dL (ref 8.9–10.3)
CO2: 28 mmol/L (ref 22–32)
Chloride: 101 mmol/L (ref 101–111)
Creatinine, Ser: 1.47 mg/dL — ABNORMAL HIGH (ref 0.61–1.24)
GFR calc Af Amer: >60 mL/min (ref >60)
GFR calc non Af Amer: 52 mL/min — ABNORMAL LOW (ref >60)
GLUCOSE: 94 mg/dL (ref 65–99)
Potassium: 3.8 mmol/L (ref 3.5–5.1)
SODIUM: 136 mmol/L (ref 135–145)
TOTAL PROTEIN: 7.1 g/dL (ref 6.5–8.1)

## 2015-08-19 LAB — URINALYSIS COMPLETE WITH MICROSCOPIC (ARMC ONLY)
Bilirubin Urine: NEGATIVE
GLUCOSE, UA: NEGATIVE mg/dL
HGB URINE DIPSTICK: NEGATIVE
Ketones, ur: NEGATIVE mg/dL
LEUKOCYTES UA: NEGATIVE
NITRITE: NEGATIVE
PROTEIN: NEGATIVE mg/dL
SPECIFIC GRAVITY, URINE: 1.013 (ref 1.005–1.030)
pH: 6 (ref 5.0–8.0)

## 2015-08-19 LAB — CBC
HEMATOCRIT: 43.2 % (ref 40.0–52.0)
HEMOGLOBIN: 15.2 g/dL (ref 13.0–18.0)
MCH: 36.1 pg — ABNORMAL HIGH (ref 26.0–34.0)
MCHC: 35 g/dL (ref 32.0–36.0)
MCV: 103 fL — ABNORMAL HIGH (ref 80.0–100.0)
Platelets: 215 10*3/uL (ref 150–440)
RBC: 4.2 MIL/uL — AB (ref 4.40–5.90)
RDW: 13 % (ref 11.5–14.5)
WBC: 6.5 10*3/uL (ref 3.8–10.6)

## 2015-08-19 LAB — MRSA PCR SCREENING: MRSA by PCR: NEGATIVE

## 2015-08-19 LAB — TROPONIN I
TROPONIN I: 0.42 ng/mL — AB (ref <0.031)
TROPONIN I: 0.25 ng/mL — AB (ref <0.031)
Troponin I: 0.37 ng/mL — ABNORMAL HIGH (ref <0.031)

## 2015-08-19 LAB — HEPARIN LEVEL (UNFRACTIONATED): Heparin Unfractionated: 0.39 IU/mL (ref 0.30–0.70)

## 2015-08-19 LAB — APTT: APTT: 27 s (ref 24–36)

## 2015-08-19 MED ORDER — ASPIRIN 81 MG PO CHEW
324.0000 mg | CHEWABLE_TABLET | Freq: Once | ORAL | Status: AC
  Administered 2015-08-19: 324 mg via ORAL
  Filled 2015-08-19: qty 4

## 2015-08-19 MED ORDER — HEPARIN (PORCINE) IN NACL 100-0.45 UNIT/ML-% IJ SOLN
800.0000 [IU]/h | INTRAMUSCULAR | Status: DC
  Administered 2015-08-19: 650 [IU]/h via INTRAVENOUS
  Filled 2015-08-19: qty 250

## 2015-08-19 MED ORDER — DISULFIRAM 250 MG PO TABS
500.0000 mg | ORAL_TABLET | Freq: Every day | ORAL | Status: DC
  Administered 2015-08-20: 500 mg via ORAL
  Filled 2015-08-19: qty 2

## 2015-08-19 MED ORDER — HEPARIN SODIUM (PORCINE) 5000 UNIT/ML IJ SOLN
3400.0000 [IU] | Freq: Once | INTRAMUSCULAR | Status: AC
  Administered 2015-08-19: 3400 [IU] via INTRAVENOUS

## 2015-08-19 MED ORDER — ACETAMINOPHEN 325 MG PO TABS: 650.0000 mg | ORAL_TABLET | ORAL | Status: DC | PRN

## 2015-08-19 MED ORDER — NITROGLYCERIN 2 % TD OINT
1.0000 [in_us] | TOPICAL_OINTMENT | Freq: Once | TRANSDERMAL | Status: AC
  Administered 2015-08-19: 1 [in_us] via TOPICAL
  Filled 2015-08-19: qty 1

## 2015-08-19 MED ORDER — ASPIRIN 300 MG RE SUPP: 300.0000 mg | RECTAL | Status: AC

## 2015-08-19 MED ORDER — NICOTINE 14 MG/24HR TD PT24
14.0000 mg | MEDICATED_PATCH | Freq: Every day | TRANSDERMAL | Status: DC
  Filled 2015-08-19: qty 1

## 2015-08-19 MED ORDER — ONDANSETRON HCL 4 MG/2ML IJ SOLN: 4.0000 mg | Freq: Four times a day (QID) | INTRAMUSCULAR | Status: DC | PRN

## 2015-08-19 MED ORDER — SODIUM CHLORIDE 0.9 % IV SOLN: 250.0000 mL | INTRAVENOUS | Status: DC | PRN

## 2015-08-19 MED ORDER — SODIUM CHLORIDE 0.9% FLUSH: 3.0000 mL | INTRAVENOUS | Status: DC | PRN

## 2015-08-19 MED ORDER — SODIUM CHLORIDE 0.9 % IV SOLN
INTRAVENOUS | Status: AC
  Administered 2015-08-19 – 2015-08-20 (×2): via INTRAVENOUS

## 2015-08-19 MED ORDER — ASPIRIN 81 MG PO CHEW: 324.0000 mg | CHEWABLE_TABLET | ORAL | Status: AC

## 2015-08-19 MED ORDER — MOMETASONE FURO-FORMOTEROL FUM 200-5 MCG/ACT IN AERO
2.0000 | INHALATION_SPRAY | Freq: Two times a day (BID) | RESPIRATORY_TRACT | Status: DC
  Filled 2015-08-19: qty 8.8

## 2015-08-19 MED ORDER — HEPARIN (PORCINE) IN NACL 100-0.45 UNIT/ML-% IJ SOLN: 14.0000 [IU]/kg/h | Freq: Once | INTRAMUSCULAR | Status: DC

## 2015-08-19 MED ORDER — SODIUM CHLORIDE 0.9% FLUSH: 3.0000 mL | Freq: Two times a day (BID) | INTRAVENOUS | Status: DC

## 2015-08-19 MED ORDER — HEPARIN SODIUM (PORCINE) 5000 UNIT/ML IJ SOLN: 60.0000 [IU]/kg | Freq: Once | INTRAMUSCULAR | Status: DC

## 2015-08-19 MED ORDER — ATORVASTATIN CALCIUM 20 MG PO TABS
40.0000 mg | ORAL_TABLET | Freq: Every day | ORAL | Status: DC
  Administered 2015-08-19: 40 mg via ORAL
  Filled 2015-08-19: qty 2

## 2015-08-19 MED ORDER — NITROGLYCERIN 0.4 MG SL SUBL: 0.4000 mg | SUBLINGUAL_TABLET | SUBLINGUAL | Status: DC | PRN

## 2015-08-19 MED ORDER — ASPIRIN EC 81 MG PO TBEC
81.0000 mg | DELAYED_RELEASE_TABLET | Freq: Every day | ORAL | Status: DC
  Administered 2015-08-20: 81 mg via ORAL
  Filled 2015-08-19: qty 1

## 2015-08-19 MED ORDER — ABACAVIR-DOLUTEGRAVIR-LAMIVUD 600-50-300 MG PO TABS
1.0000 | ORAL_TABLET | Freq: Every day | ORAL | Status: DC
  Administered 2015-08-20: 1 via ORAL
  Filled 2015-08-19: qty 1

## 2015-08-19 NOTE — Consult Note (Signed)
Brian Marquez is a 55 y.o. male  [id number]  Primary Cardiologist: Adrian Blackwater Reason for Consultation: Elevated troponin  HPI: Brian Marquez presented for evaluation after he had several falls in the last 2 days and his assisted living staff requested he be seen. He states that he might have been a little lightheaded, but mainly he stumbled while walking his bike. He denies any injury. While in ED he was found to have elevated troponin. He denies any chest pain or pressure, shortness of breath, orthopnea, dizziness or recent swelling of the feet/ankles or abdomen. He denies any cardiac history, hypertension, diabetes, hyperlipidemia or stroke. He does have history of HIV, smoking and alcohol.   Review of Systems: as described above   Past Medical History  Diagnosis Date  . HIV infection (HCC)   . 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 Coma (HCC)   . Encephalopathy   . Alcoholism (HCC)   . Alcoholism (HCC) 08/18/2014  . 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 Alcohol abuse 03/31/2015    Medications Prior to Admission  Medication Sig Dispense Refill  . abacavir-dolutegravir-lamiVUDine (TRIUMEQ) 600-50-300 MG tablet Take 1 tablet by mouth daily.    Marland Kitchen disulfiram (ANTABUSE) 250 MG tablet Take 500 mg by mouth daily.    . Fluticasone-Salmeterol (ADVAIR) 250-50 MCG/DOSE AEPB Inhale 1 puff into the lungs every 12 (twelve) hours.    . mirtazapine (REMERON) 15 MG tablet Take 15 mg by mouth at bedtime.    . triamcinolone cream (KENALOG) 0.5 % Apply 1 application topically 2 (two) times daily as needed (for irritation).       Melene Muller ON 08/20/2015] abacavir-dolutegravir-lamiVUDine  1 tablet Oral Daily  . aspirin  324 mg Oral NOW   Or  . aspirin  300 mg Rectal NOW  . [START ON 08/20/2015] aspirin EC  81 mg Oral Daily  . atorvastatin  40 mg Oral q1800  . [START ON 08/20/2015] disulfiram  500 mg Oral Daily  . mometasone-formoterol  2 puff Inhalation BID  . nicotine  14 mg Transdermal Daily  . sodium chloride flush   3 mL Intravenous Q12H    Infusions: . sodium chloride    . heparin 650 Units/hr (08/19/15 1152)    No Known Allergies  Social History   Social History  . Marital Status: Single    Spouse Name: N/A  . Number of Children: N/A  . Years of Education: N/A   Occupational History  . Not on file.   Social History Main Topics  . Smoking status: Current Every Day Smoker -- 1.00 packs/day    Types: Cigarettes  . Smokeless tobacco: Never Used  . Alcohol Use: 0.0 oz/week    0 Standard drinks or equivalent per week     Comment: Occassional, 40oz when he does drink  . Drug Use: No  . Sexual Activity: No   Other Topics Concern  . Not on file   Social History Narrative    History reviewed. No pertinent family history.  PHYSICAL EXAM: Filed Vitals:   08/19/15 1239 08/19/15 1342  BP: 133/87 134/81  Pulse: 52 59  Temp:  98.2 F (36.8 C)  Resp: 22 19    No intake or output data in the 24 hours ending 08/19/15 1408  General:  Well appearing. No respiratory difficulty HEENT: normal Neck: supple. no JVD. Carotids 2+ bilat; no bruits. No lymphadenopathy or thryomegaly appreciated. Cor: PMI nondisplaced. Regular rate & rhythm. No rubs, gallops or murmurs. Lungs: clear Abdomen: soft, nontender,  nondistended. No hepatosplenomegaly. No bruits or masses. Good bowel sounds. Extremities: no cyanosis, clubbing, rash, edema Neuro: alert & oriented x 3, cranial nerves grossly intact. moves all 4 extremities w/o difficulty. Affect pleasant.  ECG: Sinus rhythm with early repolarization, no STEMI  Results for orders placed or performed during the hospital encounter of 08/19/15 (from the past 24 hour(s))  CBC     Status: Abnormal   Collection Time: 08/19/15  9:12 AM  Result Value Ref Range   WBC 6.5 3.8 - 10.6 K/uL   RBC 4.20 (L) 4.40 - 5.90 MIL/uL   Hemoglobin 15.2 13.0 - 18.0 g/dL   HCT 16.1 09.6 - 04.5 %   MCV 103.0 (H) 80.0 - 100.0 fL   MCH 36.1 (H) 26.0 - 34.0 pg   MCHC 35.0  32.0 - 36.0 g/dL   RDW 40.9 81.1 - 91.4 %   Platelets 215 150 - 440 K/uL  Comprehensive metabolic panel     Status: Abnormal   Collection Time: 08/19/15  9:12 AM  Result Value Ref Range   Sodium 136 135 - 145 mmol/L   Potassium 3.8 3.5 - 5.1 mmol/L   Chloride 101 101 - 111 mmol/L   CO2 28 22 - 32 mmol/L   Glucose, Bld 94 65 - 99 mg/dL   BUN 17 6 - 20 mg/dL   Creatinine, Ser 7.82 (H) 0.61 - 1.24 mg/dL   Calcium 9.7 8.9 - 95.6 mg/dL   Total Protein 7.1 6.5 - 8.1 g/dL   Albumin 3.7 3.5 - 5.0 g/dL   AST 19 15 - 41 U/L   ALT 11 (L) 17 - 63 U/L   Alkaline Phosphatase 69 38 - 126 U/L   Total Bilirubin 0.5 0.3 - 1.2 mg/dL   GFR calc non Af Amer 52 (L) >60 mL/min   GFR calc Af Amer >60 >60 mL/min   Anion gap 7 5 - 15  Troponin I     Status: Abnormal   Collection Time: 08/19/15  9:12 AM  Result Value Ref Range   Troponin I 0.37 (H) <0.031 ng/mL  Protime-INR     Status: None   Collection Time: 08/19/15 11:15 AM  Result Value Ref Range   Prothrombin Time 13.7 11.4 - 15.0 seconds   INR 1.03   Urinalysis complete, with microscopic ([facility] only)     Status: Abnormal   Collection Time: 08/19/15 11:39 AM  Result Value Ref Range   Color, Urine YELLOW (A) YELLOW   APPearance CLEAR (A) CLEAR   Glucose, UA NEGATIVE NEGATIVE mg/dL   Bilirubin Urine NEGATIVE NEGATIVE   Ketones, ur NEGATIVE NEGATIVE mg/dL   Specific Gravity, Urine 1.013 1.005 - 1.030   Hgb urine dipstick NEGATIVE NEGATIVE   pH 6.0 5.0 - 8.0   Protein, ur NEGATIVE NEGATIVE mg/dL   Nitrite NEGATIVE NEGATIVE   Leukocytes, UA NEGATIVE NEGATIVE   RBC / HPF 0-5 0 - 5 RBC/hpf   WBC, UA 0-5 0 - 5 WBC/hpf   Bacteria, UA RARE (A) NONE SEEN   Squamous Epithelial / LPF 0-5 (A) NONE SEEN   Mucous PRESENT   APTT     Status: None   Collection Time: 08/19/15 11:39 AM  Result Value Ref Range   aPTT 27 24 - 36 seconds   Dg Chest 2 View  08/19/2015  CLINICAL DATA:  55 year old male with dizziness for falling yesterday. HIV. History of  COPD. EXAM: CHEST  2 VIEW COMPARISON:  Chest x-ray 06/19/2010. FINDINGS: Lung volumes are normal.  No consolidative airspace disease. No pleural effusions. No pneumothorax. No pulmonary nodule or mass noted. Pulmonary vasculature and the cardiomediastinal silhouette are within normal limits. IMPRESSION: No radiographic evidence of acute cardiopulmonary disease. Electronically Signed   By: Trudie Reedaniel  Entrikin M.D.   On: 08/19/2015 11:44     ASSESSMENT AND PLAN: Elevated troponins, possible non-STEMI. No chest pain or shortness of breath. No cardiac history. Risk factors include long time smoker, alcohol use, hypertiglyceridemia on last lipid panel and HIV.  Will cycle troponins through the night. Check echo. Advise heparin drip, aspirin, and statin. Dr. Welton FlakesKhan will see and reassess in the morning. If no increase in troponins will plan outpatient workup.  Berton BonJanine Jose Alleyne, NP 08/19/2015 2:08 PM

## 2015-08-19 NOTE — Progress Notes (Addendum)
ANTICOAGULATION CONSULT NOTE - Initial Consult  Pharmacy Consult for Heparin Drip Indication: chest pain/ACS  No Known Allergies  Patient Measurements: Height: 5\' 7"  (170.2 cm) Weight: 128 lb (58.06 kg) IBW/kg (Calculated) : 66.1 Heparin Dosing Weight: 58.1 kg  Vital Signs: Temp: 98.2 F (36.8 C) (06/21 1342) Temp Source: Oral (06/21 1342) BP: 134/81 mmHg (06/21 1342) Pulse Rate: 59 (06/21 1342)  Labs:  Recent Labs  08/19/15 0912 08/19/15 1115 08/19/15 1139 08/19/15 1426 08/19/15 1802  HGB 15.2  --   --   --   --   HCT 43.2  --   --   --   --   PLT 215  --   --   --   --   APTT  --   --  27  --   --   LABPROT  --  13.7  --   --   --   INR  --  1.03  --   --   --   HEPARINUNFRC  --   --   --   --  0.39  CREATININE 1.47*  --   --   --   --   TROPONINI 0.37*  --   --  0.42*  --     Estimated Creatinine Clearance: 46.7 mL/min (by C-G formula based on Cr of 1.47).   Medical History: Past Medical History  Diagnosis Date  . HIV infection (HCC)   . 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 Coma (HCC)   . Encephalopathy   . Alcoholism (HCC)   . Alcoholism (HCC) 08/18/2014  . 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 Alcohol abuse 03/31/2015    Medications:  Scheduled:  . [START ON 08/20/2015] abacavir-dolutegravir-lamiVUDine  1 tablet Oral Daily  . aspirin  324 mg Oral NOW   Or  . aspirin  300 mg Rectal NOW  . [START ON 08/20/2015] aspirin EC  81 mg Oral Daily  . atorvastatin  40 mg Oral q1800  . [START ON 08/20/2015] disulfiram  500 mg Oral Daily  . mometasone-formoterol  2 puff Inhalation BID  . nicotine  14 mg Transdermal Daily  . sodium chloride flush  3 mL Intravenous Q12H   Infusions:  . sodium chloride 75 mL/hr at 08/19/15 1502  . heparin 650 Units/hr (08/19/15 1152)    Assessment: Pharmacy consulted to initiate and titrate heparin drip in a 55 yo male with NSTEMI.    APTT: 27, INR: 1.03  Goal of Therapy:  Heparin level 0.3-0.7 units/ml Monitor platelets by  anticoagulation protocol: Yes   Plan:  Give 3400 units bolus x 1 Start heparin infusion at 650 units/hr Check anti-Xa level in 6 hours and daily while on heparin Continue to monitor H&H and platelets   6/21:  HL @ 18:00 = 0.39 .  Will continue this pt on current rate of 650 units/hr and recheck HL in 6 hrs on 6/22 @ 00:00.   Robbins,Jason D 08/19/2015,7:21 PM  08/20/2015 0428 HL subtherapeutic. 1700 units IV x 1 bolus and increase rate to 800 units/hr. Recheck heparin level in 6 hours. Nikkie Liming A. [place], [place].D., BCPS, Clinical Pharmacist

## 2015-08-19 NOTE — ED Notes (Signed)
[facility] labs, possible altered mental status, and possible UTI. Pt presents alert and oriented at this time. Unknown which labs are abnormal at this time. Pt calm and cooperative and pleasant at this time.

## 2015-08-19 NOTE — Progress Notes (Signed)
ANTICOAGULATION CONSULT NOTE - Initial Consult  Pharmacy Consult for Heparin Drip Indication: chest pain/ACS  No Known Allergies  Patient Measurements: Height: 5\' 7"  (170.2 cm) Weight: 128 lb (58.06 kg) IBW/kg (Calculated) : 66.1 Heparin Dosing Weight: 58.1 kg  Vital Signs: Temp: 98.2 F (36.8 C) (06/21 1342) Temp Source: Oral (06/21 1342) BP: 134/81 mmHg (06/21 1342) Pulse Rate: 59 (06/21 1342)  Labs:  Recent Labs  08/19/15 0912 08/19/15 1115 08/19/15 1139  HGB 15.2  --   --   HCT 43.2  --   --   PLT 215  --   --   APTT  --   --  27  LABPROT  --  13.7  --   INR  --  1.03  --   CREATININE 1.47*  --   --   TROPONINI 0.37*  --   --     Estimated Creatinine Clearance: 46.7 mL/min (by C-G formula based on Cr of 1.47).   Medical History: Past Medical History  Diagnosis Date  . HIV infection (HCC)   . 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 Coma (HCC)   . Encephalopathy   . Alcoholism (HCC)   . Alcoholism (HCC) 08/18/2014  . 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 Alcohol abuse 03/31/2015    Medications:  Scheduled:  . abacavir-dolutegravir-lamiVUDine  1 tablet Oral Daily  . aspirin  324 mg Oral NOW   Or  . aspirin  300 mg Rectal NOW  . [START ON 08/20/2015] aspirin EC  81 mg Oral Daily  . atorvastatin  40 mg Oral q1800  . disulfiram  500 mg Oral Daily  . mometasone-formoterol  2 puff Inhalation BID  . nicotine  14 mg Transdermal Daily  . sodium chloride flush  3 mL Intravenous Q12H   Infusions:  . sodium chloride    . heparin 650 Units/hr (08/19/15 1152)    Assessment: Pharmacy consulted to initiate and titrate heparin drip in a 55 yo male with NSTEMI.    APTT: 27, INR: 1.03  Goal of Therapy:  Heparin level 0.3-0.7 units/ml Monitor platelets by anticoagulation protocol: Yes   Plan:  Give 3400 units bolus x 1 Start heparin infusion at 650 units/hr Check anti-Xa level in 6 hours and daily while on heparin Continue to monitor H&H and platelets  Ellenore Roscoe  G 08/19/2015,2:02 PM

## 2015-08-19 NOTE — ED Notes (Signed)
Patient left for X-ray 

## 2015-08-19 NOTE — ED Notes (Signed)
NAD noted at time. Pt resting in bed with blanket pulled over his head. VSS. Will continue to monitor for further patient needs at this time.

## 2015-08-19 NOTE — ED Provider Notes (Signed)
[facility] Emergency Department Provider Note  ___________________________________________  Time seen: Approximately 9:02 AM  I have reviewed the triage vital signs and the nursing notes.   HISTORY  Chief Complaint Abnormal Labs   HPI Brian Marquez is a 55 y.o. male who was sent over for an abnormal lab and evaluation. Patient states that he has no complaints, and he is not sure what Lamb was abnormal. Patient denies any fever, chills, headache, dizziness, nausea, vomiting, abdominal pain, dysuria, frequency, hematuria, rash, or cough or cold symptoms. No other complaints. Patient does have a history of HIV and alcoholism.   Past Medical History  Diagnosis Date  . HIV infection (HCC)   . 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 Coma (HCC)   . Encephalopathy   . Alcoholism (HCC)   . Alcoholism (HCC) 08/18/2014  . 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 Alcohol abuse 03/31/2015    Patient Active Problem List   Diagnosis Date Noted  . Alcohol abuse 03/31/2015  . 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 Alcoholism (HCC) 08/18/2014  . HIV disease (HCC) 12/09/2013  . 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 Poor dentition 10/26/2011  . Broken teeth 10/26/2011  . Renal insufficiency 01/24/2011  . Encephalopathy 07/13/2010  . 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 Homelessness 07/13/2010  . Polysubstance abuse 07/13/2010  . ROTATOR CUFF SYNDROME, LEFT 01/27/2009  . Human immunodeficiency virus (HIV) disease (HCC) 06/04/2008  . CRYPTOCOCCAL MENINGITIS 06/04/2008  . CIGARETTE SMOKER 06/04/2008    History reviewed. No pertinent past surgical history.  Current Outpatient Rx  Name  Route  Sig  Dispense  Refill  . abacavir-dolutegravir-lamiVUDine (TRIUMEQ) 600-50-300 MG tablet   Oral   Take 1 tablet by mouth daily.         Marland Kitchen disulfiram (ANTABUSE) 250 MG tablet   Oral   Take 500 mg by mouth daily.         . Fluticasone-Salmeterol (ADVAIR) 250-50 MCG/DOSE AEPB  Inhalation   Inhale 1 puff into the lungs every 12 (twelve) hours.         . mirtazapine (REMERON) 15 MG tablet   Oral   Take 15 mg by mouth at bedtime.         . triamcinolone cream (KENALOG) 0.5 %   Topical   Apply 1 application topically 2 (two) times daily as needed (for irritation).           Allergies Review of patient's allergies indicates no known allergies.  History reviewed. No pertinent family history.  Social History Social History  Substance Use Topics  . Smoking status: Current Every Day Smoker -- 1.00 packs/day    Types: Cigarettes  . Smokeless tobacco: Never Used  . Alcohol Use: 0.0 oz/week    0 Standard drinks or equivalent per week     Comment: Occassional, 40oz when he does drink    Review of Systems Constitutional: No fever/chills Eyes: No visual changes. ENT: No sore throat. Cardiovascular: Denies chest pain. Respiratory: Denies shortness of breath. Gastrointestinal: No abdominal pain.  No nausea, no vomiting.  No diarrhea.  No constipation. Genitourinary: Negative for dysuria. Musculoskeletal: Negative for back pain. Skin: Negative for rash. Neurological: Negative for headaches, focal weakness or numbness.  10-point ROS otherwise negative.  ____________________________________________   PHYSICAL EXAM:  VITAL SIGNS: ED Triage Vitals  Enc Vitals Group     BP --      Pulse --      Resp --  Temp --      Temp src --      SpO2 08/19/15 0857 99 %     Weight --      Height --      Head Cir --      Peak Flow --      Pain Score --      Pain Loc --      Pain Edu? --      Excl. in GC? --     Constitutional: Alert and oriented. Well appearing and in no acute distress. Eyes: Conjunctivae are normal. PERRL. EOMI. Head: Atraumatic. Nose: No congestion/rhinnorhea. Mouth/Throat: Mucous membranes are moist.  Oropharynx non-erythematous. Neck: No stridor.   Cardiovascular: Normal rate, regular rhythm. Grossly normal heart sounds.   Good peripheral circulation. Respiratory: Normal respiratory effort.  No retractions. Lungs CTAB. Gastrointestinal: Soft and nontender. No distention. No abdominal bruits. No CVA tenderness. Musculoskeletal: No lower extremity tenderness nor edema.  No joint effusions. Neurologic:  Normal speech and language. No gross focal neurologic deficits are appreciated. No gait instability. Skin:  Skin is warm, dry and intact. No rash noted. Psychiatric: Mood and affect are normal. Speech and behavior are normal.  ____________________________________________   LABS (all labs ordered are listed, but only abnormal results are displayed)  Labs Reviewed  CBC - Abnormal; Notable for the following:    RBC 4.20 (*)    MCV 103.0 (*)    MCH 36.1 (*)    All other components within normal limits  COMPREHENSIVE METABOLIC PANEL - Abnormal; Notable for the following:    Creatinine, Ser 1.47 (*)    ALT 11 (*)    GFR calc non Af Amer 52 (*)    All other components within normal limits  TROPONIN I - Abnormal; Notable for the following:    Troponin I 0.37 (*)    All other components within normal limits  PROTIME-INR  URINALYSIS COMPLETEWITH MICROSCOPIC ([facility] ONLY)  APTT   ____________________________________________  EKG ED ECG REPORT I, Leona Carry, the attending physician, personally viewed and interpreted this ECG.   Date: 08/19/2015  EKG Time: 11:04 AM  Rate: 54  Rhythm: sinus bradycardia  Axis: Normal  Intervals:none  ST&T Change: ST changes consistent with early repolarization, not an acute MI.   ____________________________________________  RADIOLOGY  Dg Chest 2 View  08/19/2015  CLINICAL DATA:  55 year old male with dizziness for falling yesterday. HIV. History of COPD. EXAM: CHEST  2 VIEW COMPARISON:  Chest x-ray 06/19/2010. FINDINGS: Lung volumes are normal. No consolidative airspace disease. No pleural effusions. No pneumothorax. No pulmonary nodule or mass noted. Pulmonary  vasculature and the cardiomediastinal silhouette are within normal limits. IMPRESSION: No radiographic evidence of acute cardiopulmonary disease. Electronically Signed   By: Trudie Reed M.D.   On: 08/19/2015 11:44    ____________________________________________   PROCEDURES  Procedure(s) performed: None  Critical Care performed: No  ____________________________________________   INITIAL IMPRESSION / ASSESSMENT AND PLAN / ED COURSE  Pertinent labs & imaging results that were available during my care of the patient were reviewed by me and considered in my medical decision making (see chart for details). 12:07 PM Patient will get routine labs to see if we can evaluate which one was abnormal.  12:07 PM Since initial troponin came back elevated, and will be did the EKG the reading said acute inferior wall MI, the patient had no reciprocal changes and it was not classic for an acute MI. The EKG was faxed to  Dr. Park BreedKahn from cardiology who stated that the changes were all early repolarization and that he definitely did not have an acute MI. He did suggest that we treat him as an acute coronary syndrome since his troponins elevated, though patient has no chest pain. He discussed placing the patient on heparin and he would follow him closely in the hospital as well as the R to trend his troponins.  12:07 PM Hospitalist consulted for admission. ____________________________________________   FINAL CLINICAL IMPRESSION(S) / ED DIAGNOSES  Final diagnoses:  Non-STEMI (non-ST elevated myocardial infarction) (HCC)      NEW MEDICATIONS STARTED DURING THIS VISIT:  New Prescriptions   No medications on file     Note:  This document was prepared using Dragon voice recognition software and may include unintentional dictation errors.     Leona CarryLinda M Brayn Eckstein, MD 08/19/15 [phone]

## 2015-08-19 NOTE — H&P (Addendum)
[facility] [place] [facility]   PATIENT NAME: Brian Marquez    MR#:  [id number]  DATE OF BIRTH:  May 31, 1960  DATE OF ADMISSION:  08/19/2015  PRIMARY CARE PHYSICIAN: Acey Lavornelius Van Dam, MD   REQUESTING/REFERRING PHYSICIAN: Leona CarryLinda M Taylor, MD  CHIEF COMPLAINT:   Chief Complaint  Patient presents with  . Abnormal Labs    Abnormal lab. HISTORY OF PRESENT ILLNESS:  Brian Marquez  is a 55 y.o. male with a known history of HIV, HIV dementia and encephalopathy and COPD. The patient was sent from [facility] ED due to abnormal labs. However, ED physician, Dr. Ladona Ridgelaylor didn't see any abnormal lab. She older lab which show elevated troponin 0.37 and a mild elevated creatinine at 1.47. The patient the patient is alert, awake and oriented 3, he denies any chest pain or palpitation, orthopnea or nocturnal dyspnea or leg edema. He denies any other symptoms. Dr. Ladona Ridgelaylor discussed with Dr. Welton FlakesKhan, who suggested patient may have non-STEMI, and start heparin drip.  PAST MEDICAL HISTORY:   Past Medical History  Diagnosis Date  . HIV infection (HCC)   . 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 Coma (HCC)   . Encephalopathy   . Alcoholism (HCC)   . Alcoholism (HCC) 08/18/2014  . 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 Alcohol abuse 03/31/2015    PAST SURGICAL HISTORY:  History reviewed. No pertinent past surgical history.  SOCIAL HISTORY:   Social History  Substance Use Topics  . Smoking status: Current Every Day Smoker -- 1.00 packs/day    Types: Cigarettes  . Smokeless tobacco: Never Used  . Alcohol Use: 0.0 oz/week    0 Standard drinks or equivalent per week     Comment: Occassional, 40oz when he does drink    FAMILY HISTORY:  History reviewed. No pertinent family history.  DRUG ALLERGIES:  No Known Allergies  REVIEW OF SYSTEMS:  CONSTITUTIONAL: No fever, fatigue or weakness.  EYES: No blurred or double vision.  EARS, NOSE, AND THROAT: No tinnitus or ear pain.  RESPIRATORY:  No cough, shortness of breath, wheezing or hemoptysis.  CARDIOVASCULAR: No chest pain, orthopnea, edema.  GASTROINTESTINAL: No nausea, vomiting, diarrhea or abdominal pain.  GENITOURINARY: No dysuria, hematuria.  ENDOCRINE: No polyuria, nocturia,  HEMATOLOGY: No anemia, easy bruising or bleeding SKIN: No rash or lesion. MUSCULOSKELETAL: No joint pain or arthritis.   NEUROLOGIC: No tingling, numbness, weakness.  PSYCHIATRY: No anxiety or depression.   MEDICATIONS AT HOME:   Prior to Admission medications   Medication Sig Start Date End Date Taking? Authorizing Provider  abacavir-dolutegravir-lamiVUDine (TRIUMEQ) 600-50-300 MG tablet Take 1 tablet by mouth daily.   Yes Historical Provider, MD  disulfiram (ANTABUSE) 250 MG tablet Take 500 mg by mouth daily.   Yes Historical Provider, MD  Fluticasone-Salmeterol (ADVAIR) 250-50 MCG/DOSE AEPB Inhale 1 puff into the lungs every 12 (twelve) hours.   Yes Historical Provider, MD  mirtazapine (REMERON) 15 MG tablet Take 15 mg by mouth at bedtime.   Yes Historical Provider, MD  triamcinolone cream (KENALOG) 0.5 % Apply 1 application topically 2 (two) times daily as needed (for irritation).   Yes Historical Provider, MD      VITAL SIGNS:  Blood pressure 133/87, pulse 52, temperature 97.8 F (36.6 C), temperature source Oral, resp. rate 22, height 5\' 7"  (1.702 m), weight 128 lb (58.06 kg), SpO2 100 %.  PHYSICAL EXAMINATION:  GENERAL:  55 y.o.-year-old patient lying in the bed with no acute distress.  EYES: Pupils  equal, round, reactive to light and accommodation. No scleral icterus. Extraocular muscles intact.  HEENT: Head atraumatic, normocephalic. Oropharynx and nasopharynx clear.  NECK:  Supple, no jugular venous distention. No thyroid enlargement, no tenderness.  LUNGS: Normal breath sounds bilaterally, no wheezing, rales,rhonchi or crepitation. No use of accessory muscles of respiration.  CARDIOVASCULAR: S1, S2 normal. No murmurs, rubs, or  gallops.  ABDOMEN: Soft, nontender, nondistended. Bowel sounds present. No organomegaly or mass.  EXTREMITIES: No pedal edema, cyanosis, or clubbing.  NEUROLOGIC: Cranial nerves II through XII are intact. Muscle strength 5/5 in all extremities. Sensation intact. Gait not checked.  PSYCHIATRIC: The patient is alert and oriented x 3.  SKIN: No obvious rash, lesion, or ulcer.   LABORATORY PANEL:   CBC  Recent Labs Lab 08/19/15 0912  WBC 6.5  HGB 15.2  HCT 43.2  PLT 215   ------------------------------------------------------------------------------------------------------------------  Chemistries   Recent Labs Lab 08/19/15 0912  NA 136  K 3.8  CL 101  CO2 28  GLUCOSE 94  BUN 17  CREATININE 1.47*  CALCIUM 9.7  AST 19  ALT 11*  ALKPHOS 69  BILITOT 0.5   ------------------------------------------------------------------------------------------------------------------  Cardiac Enzymes  Recent Labs Lab 08/19/15 0912  TROPONINI 0.37*   ------------------------------------------------------------------------------------------------------------------  RADIOLOGY:  Dg Chest 2 View  08/19/2015  CLINICAL DATA:  55 year old male with dizziness for falling yesterday. HIV. History of COPD. EXAM: CHEST  2 VIEW COMPARISON:  Chest x-ray 06/19/2010. FINDINGS: Lung volumes are normal. No consolidative airspace disease. No pleural effusions. No pneumothorax. No pulmonary nodule or mass noted. Pulmonary vasculature and the cardiomediastinal silhouette are within normal limits. IMPRESSION: No radiographic evidence of acute cardiopulmonary disease. Electronically Signed   By: Trudie Reed M.D.   On: 08/19/2015 11:44    EKG:   Orders placed or performed during the hospital encounter of 08/19/15  . ED EKG  . ED EKG    IMPRESSION AND PLAN:   Non-STEMI The patient will be admitted to medical floor with telemetry monitor. Continue heparin drip, aspirin and lipitor. Follow-up with  Dr. Welton Flakes for further workup and treatment.  ARF. Start IV fluid support and follow-up BMP.  Hyperlipidemia. Continue Lipitor.  History of HIV, HIV dementia and HIV encephalopathy. Continue HIV medication.  COPD. stable.  Tobacco abuse. Smoking cessation was counseled for 3 minutes, nicotine patch.  All the records are reviewed and case discussed with ED provider. Management plans discussed with the patient, family and they are in agreement.  CODE STATUS: Full code  TOTAL TIME TAKING CARE OF THIS PATIENT: 50 minutes.    Shaune Pollack M.D on 08/19/2015 at 12:42 PM  Between 7am to 6pm - Pager - [phone]  After 6pm go to [email or web address] - password [email or web address] [facility]  [facility]  Office  [phone]  CC: Primary care physician; Acey Lav, MD

## 2015-08-19 NOTE — ED Notes (Signed)
Pt  Continues to rest in bed with blanket pulled over his head. No change in patient status, pt calm and cooperative at this time. Denies needs. Will continue to monitor at this time.

## 2015-08-19 NOTE — Care Management (Signed)
[facility] CSW updated. R/O NSTEMI, First troponin 0.42.  HX: HIV, HIV dementia, COPD, ETOH. Will assist as needed.

## 2015-08-20 DIAGNOSIS — N179 Acute kidney failure, unspecified: Secondary | ICD-10-CM | POA: Diagnosis not present

## 2015-08-20 DIAGNOSIS — R7989 Other specified abnormal findings of blood chemistry: Secondary | ICD-10-CM | POA: Diagnosis not present

## 2015-08-20 DIAGNOSIS — Z21 Asymptomatic human immunodeficiency virus [HIV] infection status: Secondary | ICD-10-CM | POA: Diagnosis not present

## 2015-08-20 DIAGNOSIS — J449 Chronic obstructive pulmonary disease, unspecified: Secondary | ICD-10-CM | POA: Diagnosis not present

## 2015-08-20 DIAGNOSIS — R748 Abnormal levels of other serum enzymes: Secondary | ICD-10-CM | POA: Diagnosis not present

## 2015-08-20 DIAGNOSIS — E785 Hyperlipidemia, unspecified: Secondary | ICD-10-CM | POA: Diagnosis not present

## 2015-08-20 DIAGNOSIS — I214 Non-ST elevation (NSTEMI) myocardial infarction: Secondary | ICD-10-CM | POA: Diagnosis not present

## 2015-08-20 LAB — ECHOCARDIOGRAM COMPLETE
E decel time: 306 msec
E/e' ratio: 6.43
FS: 35 % (ref 28–44)
HEIGHTINCHES: 67 in
IVS/LV PW RATIO, ED: 0.82
LA vol A4C: 24 ml
LA vol index: 13.8 mL/m2
LA vol: 23 mL
LADIAMINDEX: 1.62 cm/m2
LASIZE: 27 mm
LDCA: 2.27 cm2
LEFT ATRIUM END SYS DIAM: 27 mm
LV E/e'average: 6.43
LV TDI E'LATERAL: 11.2
LV TDI E'MEDIAL: 11.6
LV e' LATERAL: 11.2 cm/s
LVEEMED: 6.43
LVOT VTI: 20.1 cm
LVOT peak grad rest: 6 mmHg
LVOTD: 17 mm
LVOTPV: 119 cm/s
LVOTSV: 46 mL
MV Dec: 306
MV pk A vel: 76.3 m/s
MV pk E vel: 72 m/s
MVPG: 2 mmHg
PW: 8.5 mm — AB (ref 0.6–1.1)
RV TAPSE: 28.7 mm
Weight: 2048 oz

## 2015-08-20 LAB — BASIC METABOLIC PANEL
Anion gap: 6 (ref 5–15)
BUN: 19 mg/dL (ref 6–20)
CALCIUM: 8.8 mg/dL — AB (ref 8.9–10.3)
CO2: 24 mmol/L (ref 22–32)
CREATININE: 1.19 mg/dL (ref 0.61–1.24)
Chloride: 107 mmol/L (ref 101–111)
Glucose, Bld: 96 mg/dL (ref 65–99)
Potassium: 4.2 mmol/L (ref 3.5–5.1)
SODIUM: 137 mmol/L (ref 135–145)

## 2015-08-20 LAB — CBC
HCT: 39 % — ABNORMAL LOW (ref 40.0–52.0)
Hemoglobin: 13.7 g/dL (ref 13.0–18.0)
MCH: 36.6 pg — AB (ref 26.0–34.0)
MCHC: 35 g/dL (ref 32.0–36.0)
MCV: 104.4 fL — ABNORMAL HIGH (ref 80.0–100.0)
PLATELETS: 174 10*3/uL (ref 150–440)
RBC: 3.74 MIL/uL — AB (ref 4.40–5.90)
RDW: 13 % (ref 11.5–14.5)
WBC: 4.6 10*3/uL (ref 3.8–10.6)

## 2015-08-20 LAB — TROPONIN I: Troponin I: 0.2 ng/mL — ABNORMAL HIGH (ref <0.031)

## 2015-08-20 LAB — HEPARIN LEVEL (UNFRACTIONATED)
HEPARIN UNFRACTIONATED: 0.14 [IU]/mL — AB (ref 0.30–0.70)
Heparin Unfractionated: 0.43 IU/mL (ref 0.30–0.70)

## 2015-08-20 MED ORDER — NITROGLYCERIN 0.4 MG SL SUBL: 0.4000 mg | SUBLINGUAL_TABLET | SUBLINGUAL | Status: DC | PRN

## 2015-08-20 MED ORDER — ASPIRIN 81 MG PO TBEC: 81.0000 mg | DELAYED_RELEASE_TABLET | Freq: Every day | ORAL | Status: DC

## 2015-08-20 MED ORDER — ATORVASTATIN CALCIUM 40 MG PO TABS: 40.0000 mg | ORAL_TABLET | Freq: Every day | ORAL | Status: DC

## 2015-08-20 MED ORDER — MIRTAZAPINE 7.5 MG PO TABS
7.5000 mg | ORAL_TABLET | Freq: Every day | ORAL | Status: DC
Start: 2015-08-20 — End: 2016-02-25

## 2015-08-20 MED ORDER — HEPARIN BOLUS VIA INFUSION
1700.0000 [IU] | Freq: Once | INTRAVENOUS | Status: AC
  Administered 2015-08-20: 1700 [IU] via INTRAVENOUS
  Filled 2015-08-20: qty 1700

## 2015-08-20 NOTE — Evaluation (Signed)
Physical Therapy Evaluation Patient Details Name: Margaret Pyleaul C Straka MRN: [id number] DOB: [birth year] Today's Date: 08/20/2015   History of Present Illness  55 yo M presented to ED from group home [facility] for abnormal labs, AMS. He was admitted for possible NSTEMI. PMH includes HIV, HIV dementia and encephalopathy, COPD, and alcohol abuse.  Clinical Impression  Pt demonstrated no functional deficits during PT evaluation. He is independent with all mobility without AD. Pt ambulated 400 ft with steady gait and good speed, no LOB with head turns. Pt is at his baseline level of function and presented no needs for skilled PT at this time. Pt will be discharged in house and orders completed. New orders will be required if a need arises in the future.    Follow Up Recommendations No PT follow up    Equipment Recommendations  None recommended by PT    Recommendations for Other Services       Precautions / Restrictions Precautions Precautions: None Restrictions Weight Bearing Restrictions: No      Mobility  Bed Mobility Overal bed mobility: Independent                Transfers Overall transfer level: Independent Equipment used: None             General transfer comment: steady with no difficulties  Ambulation/Gait Ambulation/Gait assistance: Independent Ambulation Distance (Feet): 400 Feet Assistive device: None Gait Pattern/deviations: Step-through pattern Gait velocity: WFL Gait velocity interpretation: at or above normal speed for age/gender General Gait Details: Steady with good speed. No LOB with head turns.   Stairs            Wheelchair Mobility    Modified Rankin (Stroke Patients Only)       Balance Overall balance assessment: No apparent balance deficits (not formally assessed)                                           Pertinent Vitals/Pain Pain Assessment: No/denies pain    Home Living Family/patient expects to be  discharged to:: Assisted living               Home Equipment: None      Prior Function Level of Independence: Independent         Comments: Pt from group home but independent with ADLs, walking community distances, rides a bike, Training and development officeretc     Hand Dominance        Extremity/Trunk Assessment   Upper Extremity Assessment: Overall WFL for tasks assessed           Lower Extremity Assessment: Overall WFL for tasks assessed         Communication   Communication: No difficulties  Cognition Arousal/Alertness: Awake/alert Behavior During Therapy: WFL for tasks assessed/performed Overall Cognitive Status: History of cognitive impairments - at baseline                      General Comments      Exercises        Assessment/Plan    PT Assessment Patent does not need any further PT services  PT Diagnosis     PT Problem List    PT Treatment Interventions     PT Goals (Current goals can be found in the Care Plan section) Acute Rehab PT Goals Patient Stated Goal: to go home PT Goal Formulation: With patient  Time For Goal Achievement: 09/03/15 Potential to Achieve Goals: Good    Frequency     Barriers to discharge        Co-evaluation               End of Session Equipment Utilized During Treatment: Gait belt Activity Tolerance: Patient tolerated treatment well Patient left: in bed;with call bell/phone within reach;with bed alarm set Nurse Communication: Mobility status         Time: [id number] PT Time Calculation (min) (ACUTE ONLY): 15 min   Charges:   PT Evaluation $PT Eval Low Complexity: 1 Procedure     PT G Codes:        Adelene IdlerMindy Jo Teigan Manner, PT, DPT  08/20/2015, 12:29 PM [phone]

## 2015-08-20 NOTE — Progress Notes (Cosign Needed Addendum)
  SUBJECTIVE: Awake and alert, was easily aroused. The patient rested well and has had no chest pain/pressure or shortness of breath.   Filed Vitals:   08/19/15 1239 08/19/15 1342 08/19/15 2015 08/20/15 0431  BP: 133/87 134/81 109/81 116/75  Pulse: 52 59 57 49  Temp:  98.2 F (36.8 C) 98.7 F (37.1 C) 98.3 F (36.8 C)  TempSrc:  Oral Oral Oral  Resp: 22 19 18 18   Height:      Weight:      SpO2: 100% 100% 99% 100%    Intake/Output Summary (Last 24 hours) at 08/20/15 0758 Last data filed at 08/20/15 [id number]  Gross per 24 hour  Intake 549.91 ml  Output   1450 ml  Net -900.09 ml    LABS: Basic Metabolic Panel:  Recent Labs  69/62/9506/21/17 0912 08/20/15 0138  NA 136 137  K 3.8 4.2  CL 101 107  CO2 28 24  GLUCOSE 94 96  BUN 17 19  CREATININE 1.47* 1.19  CALCIUM 9.7 8.8*   Liver Function Tests:  Recent Labs  08/19/15 0912  AST 19  ALT 11*  ALKPHOS 69  BILITOT 0.5  PROT 7.1  ALBUMIN 3.7   No results for input(s): LIPASE, AMYLASE in the last 72 hours. CBC:  Recent Labs  08/19/15 0912 08/20/15 0138  WBC 6.5 4.6  HGB 15.2 13.7  HCT 43.2 39.0*  MCV 103.0* 104.4*  PLT 215 174   Cardiac Enzymes:  Recent Labs  08/19/15 1426 08/19/15 2013 08/20/15 0138  TROPONINI 0.42* 0.25* 0.20*   BNP: Invalid input(s): POCBNP D-Dimer: No results for input(s): DDIMER in the last 72 hours. Hemoglobin A1C: No results for input(s): HGBA1C in the last 72 hours. Fasting Lipid Panel: No results for input(s): CHOL, HDL, LDLCALC, TRIG, CHOLHDL, LDLDIRECT in the last 72 hours. Thyroid Function Tests: No results for input(s): TSH, T4TOTAL, T3FREE, THYROIDAB in the last 72 hours.  Invalid input(s): FREET3 Anemia Panel: No results for input(s): VITAMINB12, FOLATE, FERRITIN, TIBC, IRON, RETICCTPCT in the last 72 hours.   PHYSICAL EXAM General: Well developed, well nourished, in no acute distress HEENT:  Normocephalic and atramatic Neck:  No JVD.  Lungs: Clear bilaterally to  auscultation and percussion. Heart: HRRR . Normal S1 and S2 without gallops or murmurs.  Abdomen: Bowel sounds are positive, abdomen soft and non-tender  Msk:  Back normal, normal gait. Normal strength and tone for age. Extremities: No clubbing, cyanosis or edema.   Neuro: Alert and oriented X 3. Psych:  Good affect, responds appropriately  TELEMETRY: Sinus rhythm/sinus bradycardia with rates in the 50s and 60s  ASSESSMENT AND PLAN: Elevated troponins with maximum level 0.42 and most recent is 0.20. Patient has never had chest pain or shortness of breath. Echocardiogram done last evening, to be read by Dr. Welton FlakesKhan. The patient has some HIV dementia and lives in an assisted living situation. The patient is stable and He tells me that he will follow-up for outpatient testing.  Active Problems:   NSTEMI (non-ST elevated myocardial infarction) (HCC)    Berton BonJanine Rakhi Romagnoli, NP 08/20/2015 7:58 AM

## 2015-08-20 NOTE — Discharge Summary (Signed)
[facility] [place] [facility]   PATIENT NAME: Brian Marquez    MR#:  [id number]  DATE OF BIRTH:  1960/04/25  DATE OF ADMISSION:  08/19/2015 ADMITTING PHYSICIAN: Shaune Pollack, MD  DATE OF DISCHARGE: 08/20/15  PRIMARY CARE PHYSICIAN: Acey Lav, MD    ADMISSION DIAGNOSIS:  Non-STEMI (non-ST elevated myocardial infarction) (HCC) [I21.4]  DISCHARGE DIAGNOSIS:  Active Problems:   NSTEMI (non-ST elevated myocardial infarction) (HCC)   SECONDARY DIAGNOSIS:   Past Medical History  Diagnosis Date  . HIV infection (HCC)   . 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 Coma (HCC)   . Encephalopathy   . Alcoholism (HCC)   . Alcoholism (HCC) 08/18/2014  . 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 Alcohol abuse 03/31/2015    HOSPITAL COURSE:   55 y/o M with PMH of HIV, HIV dementia, chronic encephalopathy, Alcohol abuse and COPD admitted from assisted living facility with falls and noted to have elevated troponin  1. Elevated troponin- likely NSTEMI vs demand ischemia - Appreciate cardiology input - stable troponins, no active chest pain - started on asa, statin. No beta blocker due to bradycardia - ECHO- normal , Normal EF, no wall motion abnormalities -Outpatient testing recommended. Nitro prn tablets given  2. HIV and HIV dementia- cont HAART trt and outpatient f/u  3. Falls- deconditioning, poor oral intake Cont remeron- decreased the dose for now- in case, sleepy  4. Alc abuse-continue antabuse  5. COPD- inh, stable  Physical Therapy eval and discharge today  DISCHARGE CONDITIONS:   Guarded  CONSULTS OBTAINED:  Treatment Team:  Laurier Nancy, MD  DRUG ALLERGIES:  No Known Allergies  DISCHARGE MEDICATIONS:   Current Discharge Medication List    START taking these medications   Details  aspirin EC 81 MG EC tablet Take 1 tablet (81 mg total) by mouth daily. Qty: 30 tablet, Refills: 2    atorvastatin (LIPITOR) 40 MG tablet Take 1 tablet (40 mg total)  by mouth daily at 6 PM. Qty: 30 tablet, Refills: 2    nitroGLYCERIN (NITROSTAT) 0.4 MG SL tablet Place 1 tablet (0.4 mg total) under the tongue every 5 (five) minutes x 3 doses as needed for chest pain. Qty: 30 tablet, Refills: 0      CONTINUE these medications which have CHANGED   Details  mirtazapine (REMERON) 7.5 MG tablet Take 1 tablet (7.5 mg total) by mouth at bedtime. Qty: 30 tablet, Refills: 2      CONTINUE these medications which have NOT CHANGED   Details  abacavir-dolutegravir-lamiVUDine (TRIUMEQ) 600-50-300 MG tablet Take 1 tablet by mouth daily.    disulfiram (ANTABUSE) 250 MG tablet Take 500 mg by mouth daily.    Fluticasone-Salmeterol (ADVAIR) 250-50 MCG/DOSE AEPB Inhale 1 puff into the lungs every 12 (twelve) hours.    triamcinolone cream (KENALOG) 0.5 % Apply 1 application topically 2 (two) times daily as needed (for irritation).         DISCHARGE INSTRUCTIONS:   1. PCP f/u in 1 week 2. Cardiology f/u in 2 weeks   If you experience worsening of your admission symptoms, develop shortness of breath, life threatening emergency, suicidal or homicidal thoughts you must seek medical attention immediately by calling 911 or calling your MD immediately  if symptoms less severe.  You Must read complete instructions/literature along with all the possible adverse reactions/side effects for all the Medicines you take and that have been prescribed to you. Take any new Medicines after you have completely understood  and accept all the possible adverse reactions/side effects.   Please note  You were cared for by a hospitalist during your hospital stay. If you have any questions about your discharge medications or the care you received while you were in the hospital after you are discharged, you can call the unit and asked to speak with the hospitalist on call if the hospitalist that took care of you is not available. Once you are discharged, your primary care physician will  handle any further medical issues. Please note that NO REFILLS for any discharge medications will be authorized once you are discharged, as it is imperative that you return to your primary care physician (or establish a relationship with a primary care physician if you do not have one) for your aftercare needs so that they can reassess your need for medications and monitor your lab values.    Today   CHIEF COMPLAINT:   Chief Complaint  Patient presents with  . Abnormal Labs     VITAL SIGNS:  Blood pressure 122/83, pulse 89, temperature 97.9 F (36.6 C), temperature source Oral, resp. rate 24, height 5\' 7"  (1.702 m), weight 58.06 kg (128 lb), SpO2 99 %.  I/O:   Intake/Output Summary (Last 24 hours) at 08/20/15 0932 Last data filed at 08/20/15 0750  Gross per 24 hour  Intake 549.91 ml  Output   1700 ml  Net -1150.09 ml    PHYSICAL EXAMINATION:   Physical Exam  GENERAL:  55 y.o.-year-old patient lying in the bed with no acute distress.  EYES: Pupils equal, round, reactive to light and accommodation. No scleral icterus. Extraocular muscles intact.  HEENT: Head atraumatic, normocephalic. Oropharynx and nasopharynx clear.  NECK:  Supple, no jugular venous distention. No thyroid enlargement, no tenderness.  LUNGS: Normal breath sounds bilaterally, no wheezing, rales,rhonchi or crepitation. No use of accessory muscles of respiration.  CARDIOVASCULAR: S1, S2 normal. No murmurs, rubs, or gallops.  ABDOMEN: Soft, non-tender, non-distended. Bowel sounds present. No organomegaly or mass.  EXTREMITIES: No pedal edema, cyanosis, or clubbing.  NEUROLOGIC: Cranial nerves II through XII are intact. Muscle strength 5/5 in all extremities. Sensation intact. Gait not checked.  PSYCHIATRIC: The patient is alert and oriented x 2. Confused conversation at times. No inhibitions. SKIN: No obvious rash, lesion, or ulcer.   DATA REVIEW:   CBC  Recent Labs Lab 08/20/15 0138  WBC 4.6  HGB 13.7   HCT 39.0*  PLT 174    Chemistries   Recent Labs Lab 08/19/15 0912 08/20/15 0138  NA 136 137  K 3.8 4.2  CL 101 107  CO2 28 24  GLUCOSE 94 96  BUN 17 19  CREATININE 1.47* 1.19  CALCIUM 9.7 8.8*  AST 19  --   ALT 11*  --   ALKPHOS 69  --   BILITOT 0.5  --     Cardiac Enzymes  Recent Labs Lab 08/20/15 0138  TROPONINI 0.20*    Microbiology Results  Results for orders placed or performed during the hospital encounter of 08/19/15  MRSA PCR Screening     Status: None   Collection Time: 08/19/15  3:02 PM  Result Value Ref Range Status   MRSA by PCR NEGATIVE NEGATIVE Final    Comment:        The GeneXpert MRSA Assay (FDA approved for NASAL specimens only), is one component of a comprehensive MRSA colonization surveillance program. It is not intended to diagnose MRSA infection nor to guide or monitor treatment for MRSA  infections.     RADIOLOGY:  Dg Chest 2 View  08/19/2015  CLINICAL DATA:  55 year old male with dizziness for falling yesterday. HIV. History of COPD. EXAM: CHEST  2 VIEW COMPARISON:  Chest x-ray 06/19/2010. FINDINGS: Lung volumes are normal. No consolidative airspace disease. No pleural effusions. No pneumothorax. No pulmonary nodule or mass noted. Pulmonary vasculature and the cardiomediastinal silhouette are within normal limits. IMPRESSION: No radiographic evidence of acute cardiopulmonary disease. Electronically Signed   By: Trudie Reedaniel  Entrikin M.D.   On: 08/19/2015 11:44    EKG:   Orders placed or performed during the hospital encounter of 08/19/15  . ED EKG  . ED EKG      Management plans discussed with the patient, family and they are in agreement.  CODE STATUS:     Code Status Orders        Start     Ordered   08/19/15 1355  Full code   Continuous     08/19/15 1354    Code Status History    Date Active Date Inactive Code Status Order ID Comments User Context   This patient has a current code status but no historical code  status.      TOTAL TIME TAKING CARE OF THIS PATIENT: 37 minutes.    Enid BaasKALISETTI,Davy Faught M.D on 08/20/2015 at 9:32 AM  Between 7am to 6pm - Pager - [phone]  After 6pm go to [email or web address] - password [email or web address] [facility]  [facility]  Office  [phone]  CC: Primary care physician; Acey Lavornelius Van Dam, MD

## 2015-08-20 NOTE — Care Management (Signed)
No discharge needs identified 

## 2015-08-20 NOTE — Care Management Obs Status (Signed)
MEDICARE OBSERVATION STATUS NOTIFICATION   Patient Details  Name: Brian Marquez MRN: [id number] Date of Birth: Sep 23, 1960   Medicare Observation Status Notification Given:  Yes    Marily MemosLisa M Linsi Humann, RN 08/20/2015, 2:07 PM

## 2015-08-20 NOTE — Progress Notes (Signed)
Clinical Social Worker was informed that patient will be medically ready to [facility]. Patient in a agreement with plan. CSW called Jimmye NormanLawanda Ray- Owner at [facility] to confirm that patient's bed is ready. All discharge information faxed [facility] via [facility]. [facility] facility [facility].  Woodroe Modehristina Amaiyah Nordhoff, MSW, LCSW-A, LCAS-A Clinical Social Worker [phone]

## 2015-08-20 NOTE — Clinical Social Work Note (Signed)
Clinical Social Work Assessment  Patient Details  Name: Brian Marquez MRN: [id number] Date of Birth: 20-Sep-1960  Date of referral:  08/20/15               Reason for consult:  Discharge Planning                Permission sought to share information with:  Chartered certified accountant granted to share information::  Yes, Verbal Permission Granted  Name::        Agency::   ([facility])  Relationship::     Contact Information:     Housing/Transportation Living arrangements for the past 2 months:  Group Home Source of Information:  Patient, Facility Patient Interpreter Needed:  None Criminal Activity/Legal Involvement Pertinent to Current Situation/Hospitalization:  No - Comment as needed Significant Relationships:  Other Family Members, Friend Lives with:  Facility Resident Do you feel safe going back to the place where you live?  Yes Need for family participation in patient care:  No (Coment)  Care giving concerns:  Patient is from [facility].    Social Worker assessment / plan:  CSW was informed that patient is from a [facility] CSW met with patient at bedside. Per patient he's from [facility]. He stated he'll return at discharge. Reported he's been there for years and enjoys living there. Verbal permission granted to facilitate discharge. CSW spoke to [employer]. She reports patient can return. Requested CSW to fax discharge information and FL2 to [phone]. Stated they will transport patient back to the facility at discharge.  FL2 placed on patient's chart for MD signature.  Employment status:  Disabled (Comment on whether or not currently receiving Disability) Insurance information:  Medicare PT Recommendations:  No Follow Up Information / Referral to community resources:     Patient/Family's Response to care:  Patient is in agreement with returning to his [facility].   Patient/Family's Understanding of and Emotional Response to Diagnosis, Current Treatment, and  Prognosis:  Patient is appreciative of CSW assistance.   Emotional Assessment Appearance:  Appears stated age Attitude/Demeanor/Rapport:   (None) Affect (typically observed):  Calm, Pleasant Orientation:  Oriented to Self, Oriented to Place, Oriented to  Time, Oriented to Situation Alcohol / Substance use:  Not Applicable Psych involvement (Current and /or in the community):  No (Comment)  Discharge Needs  Concerns to be addressed:  Discharge Planning Concerns Readmission within the last 30 days:  No Current discharge risk:  None Barriers to Discharge:  No Barriers Identified   [place], LCSW 08/20/2015, 11:55 AM

## 2015-08-20 NOTE — NC FL2 (Signed)
[place] MEDICAID FL2 LEVEL OF CARE SCREENING TOOL     IDENTIFICATION  Patient Name: Brian Marquez Birthdate: 30-Aug-1960 Sex: male Admission Date (Current Location): 08/19/2015  [facility] [place]:  [place]  ([id number]) Facility and Address:  [facility], [address], [place], [place] [address]      Provider Number: [id number]  Attending Physician Name and Address:  Enid Baasadhika Kalisetti, MD  Relative Name and Phone Number:       Current Level of Care: Hospital Recommended [facility] Prior Approval Number:    Date Approved/Denied:   PASRR Number:  [id number])  Discharge Plan: Other (Comment) [facility]    Current Diagnoses: Patient Active Problem List   Diagnosis Date Noted  . NSTEMI (non-ST elevated myocardial infarction) (HCC) 08/19/2015  . Alcohol abuse 03/31/2015  . 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 Alcoholism (HCC) 08/18/2014  . HIV disease (HCC) 12/09/2013  . 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 Poor dentition 10/26/2011  . Broken teeth 10/26/2011  . Renal insufficiency 01/24/2011  . Encephalopathy 07/13/2010  . 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 Homelessness 07/13/2010  . Polysubstance abuse 07/13/2010  . ROTATOR CUFF SYNDROME, LEFT 01/27/2009  . Human immunodeficiency virus (HIV) disease (HCC) 06/04/2008  . CRYPTOCOCCAL MENINGITIS 06/04/2008  . CIGARETTE SMOKER 06/04/2008    Orientation RESPIRATION BLADDER Height & Weight     Self, Time, Situation, Place  Normal Continent Weight: 128 lb (58.06 kg) Height:  5\' 7"  (170.2 cm)  BEHAVIORAL SYMPTOMS/MOOD NEUROLOGICAL BOWEL NUTRITION STATUS   (None)  (None) Continent Diet (Heart)  AMBULATORY STATUS COMMUNICATION OF NEEDS Skin   Independent Verbally Normal                       Personal Care Assistance Level of Assistance  Bathing, Feeding, Dressing Bathing Assistance: Independent Feeding assistance:  Independent Dressing Assistance: Independent     Functional Limitations Info  Sight, Hearing, Speech Sight Info: Adequate Hearing Info: Adequate Speech Info: Adequate    SPECIAL CARE FACTORS FREQUENCY                       Contractures      Additional Factors Info  Code Status, Allergies Code Status Info:  (Full Code) Allergies Info:  (No Known Allergies )           Discharge Medications   Current Discharge Medication List    START taking these medications   Details  aspirin EC 81 MG EC tablet Take 1 tablet (81 mg total) by mouth daily. Qty: 30 tablet, Refills: 2    atorvastatin (LIPITOR) 40 MG tablet Take 1 tablet (40 mg total) by mouth daily at 6 PM. Qty: 30 tablet, Refills: 2    nitroGLYCERIN (NITROSTAT) 0.4 MG SL tablet Place 1 tablet (0.4 mg total) under the tongue every 5 (five) minutes x 3 doses as needed for chest pain. Qty: 30 tablet, Refills: 0      CONTINUE these medications which have CHANGED   Details  mirtazapine (REMERON) 7.5 MG tablet Take 1 tablet (7.5 mg total) by mouth at bedtime. Qty: 30 tablet, Refills: 2      CONTINUE these medications which have NOT CHANGED   Details  abacavir-dolutegravir-lamiVUDine (TRIUMEQ) 600-50-300 MG tablet Take 1 tablet by mouth daily.    disulfiram (ANTABUSE) 250 MG tablet Take 500 mg by mouth daily.    Fluticasone-Salmeterol (ADVAIR) 250-50 MCG/DOSE AEPB Inhale  1 puff into the lungs every 12 (twelve) hours.    triamcinolone cream (KENALOG) 0.5 % Apply 1 application topically 2 (two) times daily as needed (for irritation).             Relevant Imaging Results:  Relevant Lab Results:   Additional Information  (SSN [id number])  Verta Ellenhristina E Pricilla Moehle, LCSW

## 2015-08-20 NOTE — Progress Notes (Signed)
Patient d/c'd to assisted living. Education provided, no questions at this time. Patient picked up by facility transportation. Telemetry removed. Trudee KusterBrandi R Mansfield

## 2015-08-21 DIAGNOSIS — F17210 Nicotine dependence, cigarettes, uncomplicated: Secondary | ICD-10-CM | POA: Diagnosis not present

## 2015-08-21 DIAGNOSIS — R7989 Other specified abnormal findings of blood chemistry: Secondary | ICD-10-CM | POA: Diagnosis not present

## 2015-08-25 DIAGNOSIS — R7989 Other specified abnormal findings of blood chemistry: Secondary | ICD-10-CM | POA: Diagnosis not present

## 2015-08-25 DIAGNOSIS — R072 Precordial pain: Secondary | ICD-10-CM | POA: Diagnosis not present

## 2015-08-31 DIAGNOSIS — I2510 Atherosclerotic heart disease of native coronary artery without angina pectoris: Secondary | ICD-10-CM | POA: Diagnosis not present

## 2015-08-31 DIAGNOSIS — R0602 Shortness of breath: Secondary | ICD-10-CM | POA: Diagnosis not present

## 2015-09-10 NOTE — Progress Notes (Signed)
   08/20/15 1215  PT Visit Information  History of Present Illness 55 yo M presented to ED from group home [facility] for abnormal labs, AMS. He was admitted for possible NSTEMI. PMH includes HIV, HIV dementia and encephalopathy, COPD, and alcohol abuse.  Precautions  Precautions None  Restrictions  Weight Bearing Restrictions No  Home Living  Family/patient expects to be discharged to: Assisted living  Home Equipment None  Prior Function  Level of Independence Independent  Comments Pt from group home but independent with ADLs, walking community distances, rides a bike, Chief Strategy Officeretc  Communication  Communication No difficulties  Cognition  Arousal/Alertness Awake/alert  Behavior During Therapy WFL for tasks assessed/performed  Overall Cognitive Status History of cognitive impairments - at baseline  Bed Mobility  Overal bed mobility Independent  Transfers  Overall transfer level Independent  Equipment used None  General transfer comment steady with no difficulties  Ambulation/Gait  Assistive device None  General Gait Details Steady with good speed. No LOB with head turns.   Gait velocity WFL  PT - End of Session  Equipment Utilized During Treatment Gait belt  Activity Tolerance Patient tolerated treatment well  Patient left in bed;with call bell/phone within reach;with bed alarm set  Nurse Communication Mobility status  PT Assessment  PT Recommendation/Assessment Patent does not need any further PT services  No Skilled PT Patient at baseline level of functioning;Patient is independent with all acitivity/mobility  PT Recommendation  Follow Up Recommendations No PT follow up  PT equipment None recommended by PT  Acute Rehab PT Goals  Patient Stated Goal to go home  PT Goal Formulation With patient  Time For Goal Achievement 09/03/15  Potential to Achieve Goals Good  PT G-Codes **NOT FOR INPATIENT CLASS**  Functional Assessment Tool Used clinical judgement, gait speed  Functional  Limitation Mobility: Walking and moving around  Mobility: Walking and Moving Around Current Status ([id number](G8978) CH  Mobility: Walking and Moving Around Goal Status (U0454(G8979) CH  Mobility: Walking and Moving Around Discharge Status [phone](G8980) CH  late entry g-codes  Kegan Mckeithan Lucillie GarfinkelJo Aadhav Uhlig, PT, DPT  09/10/2015, 10:22 AM [phone]

## 2015-09-17 DIAGNOSIS — E785 Hyperlipidemia, unspecified: Secondary | ICD-10-CM | POA: Diagnosis not present

## 2015-09-17 DIAGNOSIS — B20 Human immunodeficiency virus [HIV] disease: Secondary | ICD-10-CM | POA: Diagnosis not present

## 2015-09-28 ENCOUNTER — Ambulatory Visit: Payer: Medicare Other | Admitting: *Deleted

## 2015-09-28 ENCOUNTER — Ambulatory Visit (INDEPENDENT_AMBULATORY_CARE_PROVIDER_SITE_OTHER): Payer: Medicare Other | Admitting: Infectious Disease

## 2015-09-28 ENCOUNTER — Encounter: Payer: Self-pay | Admitting: Infectious Disease

## 2015-09-28 VITALS — BP 158/83 | HR 61 | Temp 97.6°F | Wt 120.0 lb

## 2015-09-28 DIAGNOSIS — F0280 Dementia in other diseases classified elsewhere without behavioral disturbance: Secondary | ICD-10-CM

## 2015-09-28 DIAGNOSIS — F1010 Alcohol abuse, uncomplicated: Secondary | ICD-10-CM | POA: Diagnosis not present

## 2015-09-28 DIAGNOSIS — F1910 Other psychoactive substance abuse, uncomplicated: Secondary | ICD-10-CM

## 2015-09-28 DIAGNOSIS — A812 Progressive multifocal leukoencephalopathy: Secondary | ICD-10-CM

## 2015-09-28 DIAGNOSIS — Z720 Tobacco use: Secondary | ICD-10-CM

## 2015-09-28 DIAGNOSIS — B20 Human immunodeficiency virus [HIV] disease: Secondary | ICD-10-CM

## 2015-09-28 DIAGNOSIS — I214 Non-ST elevation (NSTEMI) myocardial infarction: Secondary | ICD-10-CM | POA: Diagnosis not present

## 2015-09-28 DIAGNOSIS — J441 Chronic obstructive pulmonary disease with (acute) exacerbation: Secondary | ICD-10-CM | POA: Diagnosis not present

## 2015-09-28 DIAGNOSIS — F17200 Nicotine dependence, unspecified, uncomplicated: Secondary | ICD-10-CM

## 2015-09-28 LAB — CBC WITH DIFFERENTIAL/PLATELET
BASOS ABS: 59 {cells}/uL (ref 0–200)
Basophils Relative: 1 %
EOS PCT: 3 %
Eosinophils Absolute: 177 cells/uL (ref 15–500)
HCT: 41.1 % (ref 38.5–50.0)
Hemoglobin: 13.8 g/dL (ref 13.2–17.1)
LYMPHS ABS: 1416 {cells}/uL (ref 850–3900)
Lymphocytes Relative: 24 %
MCH: 34.5 pg — AB (ref 27.0–33.0)
MCHC: 33.6 g/dL (ref 32.0–36.0)
MCV: 102.8 fL — ABNORMAL HIGH (ref 80.0–100.0)
MONOS PCT: 9 %
MPV: 9.3 fL (ref 7.5–12.5)
Monocytes Absolute: 531 cells/uL (ref 200–950)
NEUTROS ABS: 3717 {cells}/uL (ref 1500–7800)
NEUTROS PCT: 63 %
PLATELETS: 208 10*3/uL (ref 140–400)
RBC: 4 MIL/uL — AB (ref 4.20–5.80)
RDW: 14 % (ref 11.0–15.0)
WBC: 5.9 10*3/uL (ref 3.8–10.8)

## 2015-09-28 LAB — COMPLETE METABOLIC PANEL WITH GFR
ALBUMIN: 3.6 g/dL (ref 3.6–5.1)
ALK PHOS: 78 U/L (ref 40–115)
ALT: 14 U/L (ref 9–46)
AST: 18 U/L (ref 10–35)
BILIRUBIN TOTAL: 0.4 mg/dL (ref 0.2–1.2)
BUN: 13 mg/dL (ref 7–25)
CALCIUM: 8.9 mg/dL (ref 8.6–10.3)
CO2: 24 mmol/L (ref 20–31)
Chloride: 108 mmol/L (ref 98–110)
Creat: 1.16 mg/dL (ref 0.70–1.33)
GFR, EST NON AFRICAN AMERICAN: 70 mL/min (ref >=60)
GFR, Est African American: 81 mL/min (ref >=60)
GLUCOSE: 87 mg/dL (ref 65–99)
POTASSIUM: 4.2 mmol/L (ref 3.5–5.3)
SODIUM: 138 mmol/L (ref 135–146)
TOTAL PROTEIN: 5.9 g/dL — AB (ref 6.1–8.1)

## 2015-09-28 MED ORDER — DOLUTEGRAVIR SODIUM 50 MG PO TABS: 50.0000 mg | ORAL_TABLET | Freq: Every day | ORAL | 11 refills | Status: DC

## 2015-09-28 MED ORDER — EMTRICITABINE-TENOFOVIR AF 200-25 MG PO TABS: 1.0000 | ORAL_TABLET | Freq: Every day | ORAL | 11 refills | Status: DC

## 2015-09-28 NOTE — Patient Instructions (Addendum)
Make appointment to meet with Pharmacy  YOur new regimen is Tivicay and Descovy  When your new medicines come DC the [facility]

## 2015-09-28 NOTE — Progress Notes (Signed)
Chief complaint: followup for HIV disease on medications  Subjective:    Patient ID: Brian Marquez, male    DOB: [birth year], 55 y.o.   MRN: [id number]  HPI   56 year old Caucasian man with HIV/AIDS, prior polysubstance abuse in the past HIV encephalopathy still living in ALF facility. Phone number is  [phone]. He has been compliant with TRIUMEQ and last VL undetectable. CD4  Is above 200.    Non-ST elevation myocardial infarction. He is now on Lipitor. He is also on Antabuse to treat the alcohol abuse in which he suffers. His caretaker comes with him says that he is freely not taking the Antabuse as he has figured out that it makes him sick if he drinks alcohol with it. He does continue to smoke and we spent quite a bit of time talking about smoking cessation today.  Lab Results  Component Value Date   HIV1RNAQUANT <20 03/31/2015   HIV1RNAQUANT <20 11/25/2014   HIV1RNAQUANT <20 08/18/2014   Lab Results  Component Value Date   CD4TABS 360 (L) 03/31/2015   CD4TABS 260 (L) 11/25/2014   CD4TABS 290 (L) 08/18/2014       Past Medical History:  Diagnosis Date  . Alcohol abuse 03/31/2015  . Alcoholism (HCC)   . Alcoholism (HCC) 08/18/2014  . Coma (HCC)   . 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 Encephalopathy   . 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 HIV infection (HCC)     No past surgical history on file.  No family history on file.    Social History   Social History  . Marital status: Single    Spouse name: N/A  . Number of children: N/A  . Years of education: N/A   Social History Main Topics  . Smoking status: Current Every Day Smoker    Packs/day: 1.00    Types: Cigarettes  . Smokeless tobacco: Never Used  . Alcohol use 0.0 oz/week     Comment: Occassional, 40oz when he does drink  . Drug use: No  . Sexual activity: No   Other Topics Concern  . None   Social History Narrative  . None    No Known Allergies   Current Outpatient Prescriptions:  .  aspirin EC 81  MG EC tablet, Take 1 tablet (81 mg total) by mouth daily., Disp: 30 tablet, Rfl: 2 .  atorvastatin (LIPITOR) 40 MG tablet, Take 1 tablet (40 mg total) by mouth daily at 6 PM., Disp: 30 tablet, Rfl: 2 .  disulfiram (ANTABUSE) 250 MG tablet, Take 500 mg by mouth daily., Disp: , Rfl:  .  Fluticasone-Salmeterol (ADVAIR) 250-50 MCG/DOSE AEPB, Inhale 1 puff into the lungs every 12 (twelve) hours., Disp: , Rfl:  .  mirtazapine (REMERON) 7.5 MG tablet, Take 1 tablet (7.5 mg total) by mouth at bedtime., Disp: 30 tablet, Rfl: 2 .  nitroGLYCERIN (NITROSTAT) 0.4 MG SL tablet, Place 1 tablet (0.4 mg total) under the tongue every 5 (five) minutes x 3 doses as needed for chest pain., Disp: 30 tablet, Rfl: 0 .  triamcinolone cream (KENALOG) 0.5 %, Apply 1 application topically 2 (two) times daily as needed (for irritation)., Disp: , Rfl:  .  dolutegravir (TIVICAY) 50 MG tablet, Take 1 tablet (50 mg total) by mouth daily., Disp: 30 tablet, Rfl: 11 .  emtricitabine-tenofovir AF (DESCOVY) 200-25 MG tablet, Take 1 tablet by mouth daily., Disp: 30 tablet, Rfl: 11    Review of Systems  Constitutional: Negative for activity change, appetite change,  chills, diaphoresis, fatigue, fever and unexpected weight change.  HENT: Negative for congestion, rhinorrhea, sinus pressure, sneezing, sore throat and trouble swallowing.   Eyes: Negative for photophobia and visual disturbance.  Respiratory: Negative for cough, chest tightness, shortness of breath, wheezing and stridor.   Cardiovascular: Negative for chest pain, palpitations and leg swelling.  Gastrointestinal: Negative for abdominal distention, abdominal pain, anal bleeding, blood in stool, constipation, diarrhea, nausea and vomiting.  Genitourinary: Negative for difficulty urinating, dysuria, flank pain and hematuria.  Musculoskeletal: Negative for arthralgias, back pain, gait problem, joint swelling and myalgias.  Skin: Negative for color change, pallor, rash and wound.   Neurological: Negative for dizziness, tremors, weakness and light-headedness.  Hematological: Negative for adenopathy. Does not bruise/bleed easily.  Psychiatric/Behavioral: Negative for agitation, behavioral problems, confusion, decreased concentration, dysphoric mood and sleep disturbance.       Objective:   Physical Exam  Constitutional: He is oriented to person, place, and time. He appears well-developed and well-nourished.  HENT:  Head: Normocephalic and atraumatic.  Mouth/Throat: Dental caries present.    Eyes: Conjunctivae and EOM are normal.  Neck: Normal range of motion. Neck supple.  Cardiovascular: Normal rate, regular rhythm and normal heart sounds.  Exam reveals no gallop and no friction rub.   No murmur heard. Pulmonary/Chest: Effort normal and breath sounds normal. No respiratory distress. He has no wheezes. He has no rales. He exhibits no tenderness.  Abdominal: Soft. Bowel sounds are normal. He exhibits no distension. There is no tenderness.  Musculoskeletal: Normal range of motion. He exhibits no edema or tenderness.  Neurological: He is alert and oriented to person, place, and time.  Skin: Skin is warm and dry. No rash noted. No erythema. No pallor.  Psychiatric: He has a normal mood and affect. His speech is normal. He is agitated. Cognition and memory are impaired.          Assessment & Plan:  HIV:  Given concern for abacavir having a potential relationship with cardiovascular disease we will change him to to [facility] and  DESCOVY   Dementia: undoubtedly still with  cognitive deficits and I DON'T THINK HE IS COMPETENT TO CARE FOR HIMSELF.  Etohism: On Antabuse sporadically probably due to his ongoing drinking. I had him see Lennox Laity today as well.  Smoking: Heavy emphasis on stopping smoking   CAD with NSTEMI: STOP SMOKING would be great. Change from ABC to TAF based regimen  COPD: NP at the SNF had noted obstructive lung pattern on PFTS but pt not on  bronchodilators. Does he need formal pulmonary evalution? He is currently on advair but not beta agonists  I spent greater than 40  minutes with the patient including greater than 50% of time in face to face counsel of the patient re his HIV, his immune system,his NSTEMI,his alcohol use, cigarette smoking, COPD  and in coordination of his  care.

## 2015-09-28 NOTE — BH Specialist Note (Signed)
Counselor met with Brian Marquez today in the exam room for a warm hand off.  Patient was oriented times four with good affect and dress.  Patient was alert and talkative.  Patient was in good spirits and was accompanied by his partner.  Patient shared that he was doing ok right now and did not feel that he needed counseling services at this time.  Patient did accept a business card and indicated that he would schedule and appointment if he ever had the need. Counselor provided support and information about counseling services.   Rolena Infante, MA ,LPC [facility]

## 2015-09-29 LAB — RPR

## 2015-09-30 LAB — T-HELPER CELL (CD4) - (RCID CLINIC ONLY)
CD4 % Helper T Cell: 21 % — ABNORMAL LOW (ref 33–55)
CD4 T CELL ABS: 330 /uL — AB (ref 400–2700)

## 2015-10-01 LAB — HIV RNA, RTPCR W/R GT (RTI, PI,INT)
HIV 1 RNA QUANT: 25 {copies}/mL — AB
HIV-1 RNA Quant, Log: 1.4 Log copies/mL — ABNORMAL HIGH

## 2015-10-12 ENCOUNTER — Ambulatory Visit (INDEPENDENT_AMBULATORY_CARE_PROVIDER_SITE_OTHER): Payer: Medicare Other | Admitting: Pharmacist Clinician (PhC)/ Clinical Pharmacy Specialist

## 2015-10-12 DIAGNOSIS — B20 Human immunodeficiency virus [HIV] disease: Secondary | ICD-10-CM | POA: Diagnosis not present

## 2015-10-12 NOTE — Patient Instructions (Signed)
Come back to see Dr. Daiva EvesVan Dam on 9/11

## 2015-10-12 NOTE — Progress Notes (Signed)
Patient ID: Brian Pyleaul C Gockel, male   DOB: 1961/01/02, 55 y.o.   MRN: [id number]  HPI: Brian Marquez is a 55 y.o. male who presents for HIV f/u after [facility] Tivicay [facility].  Allergies: No Known Allergies  Past Medical History: Past Medical History:  Diagnosis Date  . Alcohol abuse 03/31/2015  . Alcoholism (HCC)   . Alcoholism (HCC) 08/18/2014  . Coma (HCC)   . 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 Encephalopathy   . 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 HIV infection [facility](HCC)     Social History: Social History   Social History  . Marital status: Single    Spouse name: N/A  . Number of children: N/A  . Years of education: N/A   Social History Main Topics  . Smoking status: Current Every Day Smoker    Packs/day: 1.00    Types: Cigarettes  . Smokeless tobacco: Never Used  . Alcohol use 0.0 oz/week     Comment: Occassional, 40oz when he does drink  . Drug use: No  . Sexual activity: No   Other Topics Concern  . Not on file   Social History Narrative  . No narrative on file    Previous Regimen: Triumeq 600-50-300 mg PO daily  Current Regimen: Tivicay 50 mg PO daily Descovy 200-25 mg PO daily  Labs: HIV 1 RNA Quant (copies/mL)  Date Value  09/28/2015 25 (H)  03/31/2015 <20  11/25/2014 <20   CD4 T Cell Abs (/uL)  Date Value  09/28/2015 330 (L)  03/31/2015 360 (L)  11/25/2014 260 (L)   Hep B S Ab (no units)  Date Value  04/29/2008 NEGATIVE   Hepatitis B Surface Ag (no units)  Date Value  04/29/2008 NEGATIVE   HCV Ab (no units)  Date Value  02/10/2014 NEGATIVE    CrCl: CrCl cannot be calculated (Unknown ideal weight.).  Lipids:    Component Value Date/Time   CHOL 201 (H) 03/31/2015 1408   TRIG 237 (H) 03/31/2015 1408   HDL 48 03/31/2015 1408   CHOLHDL 4.2 03/31/2015 1408   VLDL 47 (H) 03/31/2015 1408   LDLCALC 106 03/31/2015 1408    Assessment: Brian Marquez presents today with caregiver from SNF. Dr. Daiva EvesVan Dam switched pt's HIV regimen  from Triumeq to Descovy+Tivicay given concern for abacavir potentially being linked to cardiovascular disease. Reviewed his most recent viral load and CD4 count. He denies any side effects. A SNF nurse administers all of this medications so he has not [facility] doses [facility], [facility] takes magnesium daily.   Recommendations: -Continue Descovy and Tivicay daily -F/u visit with Dr. Daiva EvesVan Dam on 9/11  Mackie Paienee Ackley, PharmD PGY1 Pharmacy Resident Pager: [phone] 10/12/2015 10:54 AM   Agreed with note above.  Ulyses SouthwardMinh Pham, PharmD Pager: [phone] 10/12/2015 11:07 PM

## 2015-11-09 ENCOUNTER — Encounter: Payer: Self-pay | Admitting: Infectious Disease

## 2015-11-09 ENCOUNTER — Ambulatory Visit (INDEPENDENT_AMBULATORY_CARE_PROVIDER_SITE_OTHER): Payer: Medicare Other | Admitting: Infectious Disease

## 2015-11-09 VITALS — BP 128/82 | HR 72 | Temp 98.0°F | Ht 67.0 in | Wt 122.0 lb

## 2015-11-09 DIAGNOSIS — Z23 Encounter for immunization: Secondary | ICD-10-CM | POA: Diagnosis not present

## 2015-11-09 DIAGNOSIS — F17200 Nicotine dependence, unspecified, uncomplicated: Secondary | ICD-10-CM

## 2015-11-09 DIAGNOSIS — B20 Human immunodeficiency virus [HIV] disease: Secondary | ICD-10-CM | POA: Diagnosis not present

## 2015-11-09 DIAGNOSIS — F1910 Other psychoactive substance abuse, uncomplicated: Secondary | ICD-10-CM

## 2015-11-09 DIAGNOSIS — A812 Progressive multifocal leukoencephalopathy: Secondary | ICD-10-CM

## 2015-11-09 DIAGNOSIS — G909 Disorder of the autonomic nervous system, unspecified: Secondary | ICD-10-CM | POA: Diagnosis not present

## 2015-11-09 DIAGNOSIS — I214 Non-ST elevation (NSTEMI) myocardial infarction: Secondary | ICD-10-CM | POA: Diagnosis not present

## 2015-11-09 DIAGNOSIS — F0280 Dementia in other diseases classified elsewhere without behavioral disturbance: Secondary | ICD-10-CM | POA: Diagnosis not present

## 2015-11-09 DIAGNOSIS — F1020 Alcohol dependence, uncomplicated: Secondary | ICD-10-CM | POA: Diagnosis not present

## 2015-11-09 HISTORY — DX: Human immunodeficiency virus (HIV) disease: B20

## 2015-11-09 NOTE — Progress Notes (Signed)
Chief complaint: followup for HIV disease after [facility] he is also complaining of some intermittent numbness in his feet  Subjective:    Patient ID: Brian Marquez, male    DOB: [birth year], 55 y.o.   MRN: [id number]  HIV Positive/AIDS    [name] with HIV/AIDS, prior polysubstance abuse in the past HIV encephalopathy still living in ALF facility. Phone number is  [phone]. He has been compliant with TRIUMEQ  And by his report and Assisted LIving with new regimen.   He has fleeting numbness in his feet.   Lab Results  Component Value Date   HIV1RNAQUANT 25 (H) 09/28/2015   HIV1RNAQUANT <20 03/31/2015   HIV1RNAQUANT <20 11/25/2014   Lab Results  Component Value Date   CD4TABS 330 (L) 09/28/2015   CD4TABS 360 (L) 03/31/2015   CD4TABS 260 (L) 11/25/2014       Past Medical History:  Diagnosis Date  . Alcohol abuse 03/31/2015  . Alcoholism (HCC)   . Alcoholism (HCC) 08/18/2014  . Coma (HCC)   . 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 Encephalopathy   . 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 HIV infection (HCC)     No past surgical history on file.  No family history on file.    Social History   Social History  . Marital status: Single    Spouse name: N/A  . Number of children: N/A  . Years of education: N/A   Social History Main Topics  . Smoking status: Current Every Day Smoker    Packs/day: 0.50    Types: Cigarettes  . Smokeless tobacco: Never Used     Comment: cutting back  . Alcohol use 0.0 oz/week     Comment: Occassional, 40oz when he does drink  . Drug use: No  . Sexual activity: No     Comment: declined condoms   Other Topics Concern  . None   Social History Narrative  . None    No Known Allergies   Current Outpatient Prescriptions:  .  aspirin EC 81 MG EC tablet, Take 1 tablet (81 mg total) by mouth daily., Disp: 30 tablet, Rfl: 2 .  atorvastatin (LIPITOR) 40 MG tablet, Take 1 tablet (40 mg total) by  mouth daily at 6 PM., Disp: 30 tablet, Rfl: 2 .  disulfiram (ANTABUSE) 250 MG tablet, Take 500 mg by mouth daily., Disp: , Rfl:  .  dolutegravir (TIVICAY) 50 MG tablet, Take 1 tablet (50 mg total) by mouth daily., Disp: 30 tablet, Rfl: 11 .  emtricitabine-tenofovir AF (DESCOVY) 200-25 MG tablet, Take 1 tablet by mouth daily., Disp: 30 tablet, Rfl: 11 .  Fluticasone-Salmeterol (ADVAIR) 250-50 MCG/DOSE AEPB, Inhale 1 puff into the lungs every 12 (twelve) hours., Disp: , Rfl:  .  mirtazapine (REMERON) 7.5 MG tablet, Take 1 tablet (7.5 mg total) by mouth at bedtime., Disp: 30 tablet, Rfl: 2 .  nitroGLYCERIN (NITROSTAT) 0.4 MG SL tablet, Place 1 tablet (0.4 mg total) under the tongue every 5 (five) minutes x 3 doses as needed for chest pain., Disp: 30 tablet, Rfl: 0 .  triamcinolone cream (KENALOG) 0.5 %, Apply 1 application topically 2 (two) times daily as needed (for irritation)., Disp: , Rfl:     Review of Systems  Constitutional: Negative for activity change, appetite change, chills, diaphoresis, fatigue, fever and unexpected weight change.  HENT: Negative for congestion, rhinorrhea, sinus pressure, sneezing, sore throat and trouble swallowing.   Eyes: Negative for photophobia  and visual disturbance.  Respiratory: Negative for cough, chest tightness, shortness of breath, wheezing and stridor.   Cardiovascular: Negative for chest pain, palpitations and leg swelling.  Gastrointestinal: Negative for abdominal distention, abdominal pain, anal bleeding, blood in stool, constipation, diarrhea, nausea and vomiting.  Genitourinary: Negative for difficulty urinating, dysuria, flank pain and hematuria.  Musculoskeletal: Negative for arthralgias, back pain, gait problem, joint swelling and myalgias.  Skin: Negative for color change, pallor, rash and wound.  Neurological: Positive for numbness. Negative for dizziness, tremors, weakness and light-headedness.  Hematological: Negative for adenopathy. Does not  bruise/bleed easily.  Psychiatric/Behavioral: Negative for agitation, behavioral problems, confusion, decreased concentration, dysphoric mood and sleep disturbance.       Objective:   Physical Exam  Constitutional: He is oriented to person, place, and time. He appears well-developed and well-nourished.  HENT:  Head: Normocephalic and atraumatic.  Mouth/Throat:    Eyes: Conjunctivae and EOM are normal.  Neck: Normal range of motion. Neck supple.  Cardiovascular: Normal rate, regular rhythm and normal heart sounds.  Exam reveals no gallop and no friction rub.   No murmur heard. Pulmonary/Chest: Effort normal and breath sounds normal. No respiratory distress. He has no wheezes. He has no rales. He exhibits no tenderness.  Abdominal: Soft. Bowel sounds are normal. He exhibits no distension. There is no tenderness.  Musculoskeletal: Normal range of motion. He exhibits no edema or tenderness.  Neurological: He is alert and oriented to person, place, and time.  Skin: Skin is warm and dry. No rash noted. No erythema. No pallor.  Psychiatric: He has a normal mood and affect. His speech is normal. Cognition and memory are impaired.          Assessment & Plan:  HIV:  Check labs on TIVICAY and  DESCOVY, then return to clinic in 6 months   Dementia: undoubtedly still with  cognitive deficits and I DON'T THINK HE IS COMPETENT TO CARE FOR HIMSELF.  Etohism: On Antabuse and seems more committed than in the past to quitting   Smoking: Heavy emphasis on stopping smoking   CAD with NSTEMI: STOP SMOKING would be great. Change from ABC to TAF based regimen  Neuropathic pains: Does not seem to be too much of an issue at present but could consider adding Neurontin in future  I spent greater than 25 minutes with the patient including greater than 50% of time in face to face counsel of the patient re his HIV HIV dementia, possible HIV neuropathy,his NSTEMI,his alcohol use, cigarette smoking, and in  coordination of his  care.

## 2015-11-13 LAB — HIV RNA, RTPCR W/R GT (RTI, PI,INT)
HIV 1 RNA Quant: <20 copies/mL
HIV-1 RNA Quant, Log: <1.3 Log copies/mL

## 2015-11-25 DIAGNOSIS — J449 Chronic obstructive pulmonary disease, unspecified: Secondary | ICD-10-CM | POA: Diagnosis not present

## 2015-11-25 DIAGNOSIS — E785 Hyperlipidemia, unspecified: Secondary | ICD-10-CM | POA: Diagnosis not present

## 2015-11-25 DIAGNOSIS — F17200 Nicotine dependence, unspecified, uncomplicated: Secondary | ICD-10-CM | POA: Diagnosis not present

## 2016-02-17 ENCOUNTER — Telehealth: Payer: Self-pay | Admitting: *Deleted

## 2016-02-17 MED ORDER — GABAPENTIN 300 MG PO CAPS: 300.0000 mg | ORAL_CAPSULE | Freq: Every day | ORAL | 2 refills | Status: DC

## 2016-02-17 NOTE — Telephone Encounter (Signed)
Patient called and advised at his last visit he and the doctor spoke about his feet. He advised his feet have been numb for about a week and was told that the doctor could call in something for this. advised the patient will have to call the doctor and find out what and give him a call back.

## 2016-02-17 NOTE — Addendum Note (Signed)
Addended by: Lurlean LeydenPOOLE, TRAVIS F on: 02/17/2016 03:24 PM   Modules accepted: Orders

## 2016-02-17 NOTE — Telephone Encounter (Signed)
Is he on gabapentin, if not would give gabapenting 300mg  at bedtime

## 2016-02-25 ENCOUNTER — Other Ambulatory Visit: Payer: Self-pay | Admitting: Infectious Disease

## 2016-03-24 ENCOUNTER — Other Ambulatory Visit: Payer: Self-pay | Admitting: Infectious Disease

## 2016-03-25 ENCOUNTER — Other Ambulatory Visit: Payer: Self-pay | Admitting: Infectious Disease

## 2016-03-25 DIAGNOSIS — L309 Dermatitis, unspecified: Secondary | ICD-10-CM

## 2016-04-20 ENCOUNTER — Other Ambulatory Visit: Payer: Self-pay | Admitting: Infectious Disease

## 2016-04-20 DIAGNOSIS — L309 Dermatitis, unspecified: Secondary | ICD-10-CM

## 2016-05-09 ENCOUNTER — Encounter: Payer: Self-pay | Admitting: Infectious Disease

## 2016-05-09 ENCOUNTER — Ambulatory Visit (INDEPENDENT_AMBULATORY_CARE_PROVIDER_SITE_OTHER): Payer: Medicare Other | Admitting: Infectious Disease

## 2016-05-09 VITALS — BP 138/85 | HR 70 | Temp 99.0°F | Ht 68.0 in | Wt 135.0 lb

## 2016-05-09 DIAGNOSIS — I214 Non-ST elevation (NSTEMI) myocardial infarction: Secondary | ICD-10-CM

## 2016-05-09 DIAGNOSIS — F17200 Nicotine dependence, unspecified, uncomplicated: Secondary | ICD-10-CM | POA: Diagnosis not present

## 2016-05-09 DIAGNOSIS — G9340 Encephalopathy, unspecified: Secondary | ICD-10-CM

## 2016-05-09 DIAGNOSIS — F1910 Other psychoactive substance abuse, uncomplicated: Secondary | ICD-10-CM

## 2016-05-09 DIAGNOSIS — G909 Disorder of the autonomic nervous system, unspecified: Secondary | ICD-10-CM

## 2016-05-09 DIAGNOSIS — B20 Human immunodeficiency virus [HIV] disease: Secondary | ICD-10-CM

## 2016-05-09 DIAGNOSIS — A812 Progressive multifocal leukoencephalopathy: Secondary | ICD-10-CM

## 2016-05-09 LAB — COMPLETE METABOLIC PANEL WITH GFR
ALBUMIN: 3.9 g/dL (ref 3.6–5.1)
ALK PHOS: 95 U/L (ref 40–115)
ALT: 11 U/L (ref 9–46)
AST: 15 U/L (ref 10–35)
BUN: 10 mg/dL (ref 7–25)
CALCIUM: 9.2 mg/dL (ref 8.6–10.3)
CO2: 27 mmol/L (ref 20–31)
Chloride: 109 mmol/L (ref 98–110)
Creat: 1.33 mg/dL (ref 0.70–1.33)
GFR, Est African American: 69 mL/min (ref >=60)
GFR, Est Non African American: 59 mL/min — ABNORMAL LOW (ref >=60)
GLUCOSE: 75 mg/dL (ref 65–99)
POTASSIUM: 4.4 mmol/L (ref 3.5–5.3)
SODIUM: 143 mmol/L (ref 135–146)
Total Bilirubin: 0.4 mg/dL (ref 0.2–1.2)
Total Protein: 6.6 g/dL (ref 6.1–8.1)

## 2016-05-09 LAB — CBC WITH DIFFERENTIAL/PLATELET
BASOS ABS: 69 {cells}/uL (ref 0–200)
Basophils Relative: 1 %
EOS PCT: 4 %
Eosinophils Absolute: 276 cells/uL (ref 15–500)
HCT: 42.2 % (ref 38.5–50.0)
HEMOGLOBIN: 14.3 g/dL (ref 13.2–17.1)
LYMPHS ABS: 1242 {cells}/uL (ref 850–3900)
LYMPHS PCT: 18 %
MCH: 33.6 pg — AB (ref 27.0–33.0)
MCHC: 33.9 g/dL (ref 32.0–36.0)
MCV: 99.1 fL (ref 80.0–100.0)
MPV: 8.8 fL (ref 7.5–12.5)
Monocytes Absolute: 483 cells/uL (ref 200–950)
Monocytes Relative: 7 %
NEUTROS PCT: 70 %
Neutro Abs: 4830 cells/uL (ref 1500–7800)
Platelets: 263 10*3/uL (ref 140–400)
RBC: 4.26 MIL/uL (ref 4.20–5.80)
RDW: 14.5 % (ref 11.0–15.0)
WBC: 6.9 10*3/uL (ref 3.8–10.8)

## 2016-05-09 NOTE — Progress Notes (Signed)
Chief complaint: followup for HIV disease [facility] from TRIUMEQ   Subjective:    Patient ID: Brian Marquez, male    DOB: 1961-02-25, 56 y.o.   MRN: [id number]  HIV Positive/AIDS    56 year old Caucasian man with HIV/AIDS, prior polysubstance abuse in the past HIV encephalopathy still living in ALF facility. Phone number is  [phone]. He has been compliant with TRIUMEQ  And by his report and Assisted LIving with new regimen.   Claims he is drinking less alcohol since having been placed on medication to help with this. Is no longer on medication though. We discussed simplifying his regimen to BIKTARVY to ensure all meds in one pill  Lab Results  Component Value Date   HIV1RNAQUANT <20 11/09/2015   HIV1RNAQUANT 25 (H) 09/28/2015   HIV1RNAQUANT <20 03/31/2015   Lab Results  Component Value Date   CD4TABS 330 (L) 09/28/2015   CD4TABS 360 (L) 03/31/2015   CD4TABS 260 (L) 11/25/2014       Past Medical History:  Diagnosis Date  . Alcohol abuse 03/31/2015  . Alcoholism (HCC)   . Alcoholism (HCC) 08/18/2014  . Coma (HCC)   . 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 Encephalopathy   . 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 HIV infection (HCC)   . 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  No family history on file.    Social History   Social History  . Marital status: Single    Spouse name: N/A  . Number of children: N/A  . Years of education: N/A   Social History Main Topics  . Smoking status: Current Every Day Smoker    Packs/day: 0.50    Types: Cigarettes  . Smokeless tobacco: Never Used     Comment: cutting back  . Alcohol use 0.0 oz/week     Comment: Occassional, 40oz when he does drink  . Drug use: No  . Sexual activity: No     Comment: declined condoms   Other Topics Concern  . None   Social History Narrative  . None    No Known Allergies   Current Outpatient Prescriptions:  .  aspirin EC  81 MG EC tablet, Take 1 tablet (81 mg total) by mouth daily., Disp: 30 tablet, Rfl: 2 .  atorvastatin (LIPITOR) 40 MG tablet, Take 1 tablet (40 mg total) by mouth daily at 6 PM., Disp: 30 tablet, Rfl: 2 .  disulfiram (ANTABUSE) 250 MG tablet, Take 500 mg by mouth daily., Disp: , Rfl:  .  dolutegravir (TIVICAY) 50 MG tablet, Take 1 tablet (50 mg total) by mouth daily., Disp: 30 tablet, Rfl: 11 .  emtricitabine-tenofovir AF (DESCOVY) 200-25 MG tablet, Take 1 tablet by mouth daily., Disp: 30 tablet, Rfl: 11 .  Fluticasone-Salmeterol (ADVAIR) 250-50 MCG/DOSE AEPB, Inhale 1 puff into the lungs every 12 (twelve) hours., Disp: , Rfl:  .  gabapentin (NEURONTIN) 300 MG capsule, TAKE 1 CAPSULE BY MOUTH AT BEDTIME, Disp: 30 capsule, Rfl: 2 .  mirtazapine (REMERON) 7.5 MG tablet, TAKE ONE TABLET BY MOUTH AT BEDTIME., Disp: 30 tablet, Rfl: 5 .  nitroGLYCERIN (NITROSTAT) 0.4 MG SL tablet, Place 1 tablet (0.4 mg total) under the tongue every 5 (five) minutes x 3 doses as needed for chest pain., Disp: 30 tablet, Rfl: 0 .  triamcinolone cream (KENALOG) 0.5 %, APPLY AS DIRECTED TO AFFECTED AREA TWICE DAILY AS NEEDED, Disp: 30 g, Rfl: 1  Review of Systems  Constitutional: Negative for activity change, appetite change, chills, diaphoresis, fatigue, fever and unexpected weight change.  HENT: Negative for congestion, rhinorrhea, sinus pressure, sneezing, sore throat and trouble swallowing.   Eyes: Negative for photophobia and visual disturbance.  Respiratory: Negative for cough, chest tightness, shortness of breath, wheezing and stridor.   Cardiovascular: Negative for chest pain, palpitations and leg swelling.  Gastrointestinal: Negative for abdominal distention, abdominal pain, anal bleeding, blood in stool, constipation, diarrhea, nausea and vomiting.  Genitourinary: Negative for difficulty urinating, dysuria, flank pain and hematuria.  Musculoskeletal: Negative for arthralgias, back pain, gait problem, joint  swelling and myalgias.  Skin: Negative for color change, pallor, rash and wound.  Neurological: Positive for numbness. Negative for dizziness, tremors, weakness and light-headedness.  Hematological: Negative for adenopathy. Does not bruise/bleed easily.  Psychiatric/Behavioral: Negative for agitation, behavioral problems, confusion, decreased concentration, dysphoric mood and sleep disturbance.       Objective:   Physical Exam  Constitutional: He is oriented to person, place, and time. He appears well-developed and well-nourished.  HENT:  Head: Normocephalic and atraumatic.  Mouth/Throat:    Eyes: Conjunctivae and EOM are normal.  Neck: Normal range of motion. Neck supple.  Cardiovascular: Normal rate, regular rhythm and normal heart sounds.  Exam reveals no gallop and no friction rub.   No murmur heard. Pulmonary/Chest: Effort normal and breath sounds normal. No respiratory distress. He has no wheezes. He has no rales. He exhibits no tenderness.  Abdominal: Soft. Bowel sounds are normal. He exhibits no distension. There is no tenderness.  Musculoskeletal: Normal range of motion. He exhibits no edema or tenderness.  Neurological: He is alert and oriented to person, place, and time.  Skin: Skin is warm and dry. No rash noted. No erythema. No pallor.  Psychiatric: He has a normal mood and affect. His speech is normal. Cognition and memory are impaired.          Assessment & Plan:  HIV:  Try to change to BIKTARVY. If not possible then keep in Tivicay and Descovy Recheck labs today and in one month  Dementia: undoubtedly still with  cognitive deficits and I DON'T THINK HE IS COMPETENT TO CARE FOR HIMSELF.  Etohism: On Antabuse  Recently and seems more committed than in the past to quitting   Smoking: Heavy emphasis on stopping smoking   CAD with NSTEMI: STOP SMOKING would be great. Change from ABC to TAF based regimen  Neuropathic pains: Does not seem to be too much of an  issue at present but could consider adding Neurontin in future  I spent greater than 25 minutes with the patient including greater than 50% of time in face to face counsel of the patient re his HIV HIV dementia, possible HIV neuropathy,his NSTEMI,his alcohol use, cigarette smoking, and in coordination of his  care.

## 2016-05-09 NOTE — Patient Instructions (Signed)
We will switch you to Biktarvy IF possible today  If we can do this we then want you to come back in one month for repeat visit and lab work  We also want lab work today

## 2016-05-10 LAB — RPR

## 2016-05-10 NOTE — Addendum Note (Signed)
Addended by: Mariea ClontsGREEN, Traci Plemons D on: 05/10/2016 01:53 PM   Modules accepted: Orders

## 2016-05-11 LAB — HIV-1 RNA QUANT-NO REFLEX-BLD
HIV 1 RNA QUANT: 69 {copies}/mL — AB
HIV-1 RNA QUANT, LOG: 1.84 {Log_copies}/mL — AB

## 2016-05-11 LAB — T-HELPER CELL (CD4) - (RCID CLINIC ONLY)
CD4 % Helper T Cell: 23 % — ABNORMAL LOW (ref 33–55)
CD4 T Cell Abs: 300 /uL — ABNORMAL LOW (ref 400–2700)

## 2016-05-24 ENCOUNTER — Other Ambulatory Visit: Payer: Self-pay | Admitting: *Deleted

## 2016-05-24 DIAGNOSIS — B20 Human immunodeficiency virus [HIV] disease: Secondary | ICD-10-CM

## 2016-05-24 MED ORDER — BICTEGRAVIR-EMTRICITAB-TENOFOV 50-200-25 MG PO TABS: 1.0000 | ORAL_TABLET | Freq: Every day | ORAL | 5 refills | Status: DC

## 2016-06-01 ENCOUNTER — Other Ambulatory Visit: Payer: Self-pay | Admitting: Infectious Disease

## 2016-06-01 DIAGNOSIS — L309 Dermatitis, unspecified: Secondary | ICD-10-CM

## 2016-06-15 ENCOUNTER — Other Ambulatory Visit: Payer: Self-pay | Admitting: Infectious Disease

## 2016-06-16 ENCOUNTER — Other Ambulatory Visit: Payer: Self-pay | Admitting: Infectious Disease

## 2016-06-21 ENCOUNTER — Telehealth: Payer: Self-pay | Admitting: *Deleted

## 2016-06-21 ENCOUNTER — Ambulatory Visit: Payer: Medicare Other | Admitting: Infectious Disease

## 2016-06-21 NOTE — Telephone Encounter (Signed)
I am ok to refill both of those for now. He does need to get established with PCP

## 2016-06-21 NOTE — Telephone Encounter (Signed)
Patient's group home called, asked for refills of aspirin and atorvastatin. These were prescribed by hospitalist during hospitalization last summer. He did not have follow up with a cardiologist as advised. Please advise if ok to refill aspirin and atorvastatin, if patient needs cardiology referral. Andree Coss, RN

## 2016-06-22 ENCOUNTER — Other Ambulatory Visit: Payer: Self-pay | Admitting: *Deleted

## 2016-06-22 DIAGNOSIS — I214 Non-ST elevation (NSTEMI) myocardial infarction: Secondary | ICD-10-CM

## 2016-06-22 MED ORDER — ASPIRIN 81 MG PO TBEC: 81.0000 mg | DELAYED_RELEASE_TABLET | Freq: Every day | ORAL | 2 refills | Status: DC

## 2016-06-22 MED ORDER — ATORVASTATIN CALCIUM 40 MG PO TABS: 40.0000 mg | ORAL_TABLET | Freq: Every day | ORAL | 2 refills | Status: DC

## 2016-06-22 NOTE — Telephone Encounter (Signed)
Patient's caregiver advised that Rx has been sent to the pharmacy with two refills. Advised that he needs to establish with a primary care provider and receive future refills from PCP.

## 2016-07-13 ENCOUNTER — Other Ambulatory Visit: Payer: Self-pay | Admitting: *Deleted

## 2016-07-13 MED ORDER — MIRTAZAPINE 7.5 MG PO TABS: 7.5000 mg | ORAL_TABLET | Freq: Every day | ORAL | 5 refills | Status: DC

## 2016-08-09 ENCOUNTER — Other Ambulatory Visit: Payer: Self-pay | Admitting: Infectious Disease

## 2016-08-09 DIAGNOSIS — I214 Non-ST elevation (NSTEMI) myocardial infarction: Secondary | ICD-10-CM

## 2016-08-10 ENCOUNTER — Encounter: Payer: Self-pay | Admitting: Infectious Disease

## 2016-08-10 ENCOUNTER — Ambulatory Visit (INDEPENDENT_AMBULATORY_CARE_PROVIDER_SITE_OTHER): Payer: Medicare Other | Admitting: Infectious Disease

## 2016-08-10 VITALS — BP 129/82 | HR 68 | Temp 98.0°F | Ht 68.0 in | Wt 138.0 lb

## 2016-08-10 DIAGNOSIS — A812 Progressive multifocal leukoencephalopathy: Secondary | ICD-10-CM

## 2016-08-10 DIAGNOSIS — B20 Human immunodeficiency virus [HIV] disease: Secondary | ICD-10-CM

## 2016-08-10 DIAGNOSIS — F0280 Dementia in other diseases classified elsewhere without behavioral disturbance: Secondary | ICD-10-CM | POA: Diagnosis not present

## 2016-08-10 DIAGNOSIS — N289 Disorder of kidney and ureter, unspecified: Secondary | ICD-10-CM | POA: Diagnosis not present

## 2016-08-10 DIAGNOSIS — F1020 Alcohol dependence, uncomplicated: Secondary | ICD-10-CM | POA: Diagnosis not present

## 2016-08-10 DIAGNOSIS — Z79899 Other long term (current) drug therapy: Secondary | ICD-10-CM | POA: Diagnosis not present

## 2016-08-10 DIAGNOSIS — Z113 Encounter for screening for infections with a predominantly sexual mode of transmission: Secondary | ICD-10-CM

## 2016-08-10 DIAGNOSIS — I214 Non-ST elevation (NSTEMI) myocardial infarction: Secondary | ICD-10-CM

## 2016-08-10 LAB — COMPLETE METABOLIC PANEL WITH GFR
ALT: 11 U/L (ref 9–46)
AST: 16 U/L (ref 10–35)
Albumin: 4 g/dL (ref 3.6–5.1)
Alkaline Phosphatase: 101 U/L (ref 40–115)
BUN: 15 mg/dL (ref 7–25)
CHLORIDE: 106 mmol/L (ref 98–110)
CO2: 27 mmol/L (ref 20–31)
Calcium: 9.5 mg/dL (ref 8.6–10.3)
Creat: 1.24 mg/dL (ref 0.70–1.33)
GFR, EST NON AFRICAN AMERICAN: 65 mL/min (ref >=60)
GFR, Est African American: 75 mL/min (ref >=60)
GLUCOSE: 110 mg/dL — AB (ref 65–99)
Potassium: 5.3 mmol/L (ref 3.5–5.3)
SODIUM: 139 mmol/L (ref 135–146)
Total Bilirubin: 0.3 mg/dL (ref 0.2–1.2)
Total Protein: 6.6 g/dL (ref 6.1–8.1)

## 2016-08-10 LAB — CBC WITH DIFFERENTIAL/PLATELET
BASOS ABS: 71 {cells}/uL (ref 0–200)
BASOS PCT: 1 %
EOS ABS: 213 {cells}/uL (ref 15–500)
Eosinophils Relative: 3 %
HEMATOCRIT: 43.8 % (ref 38.5–50.0)
HEMOGLOBIN: 14.3 g/dL (ref 13.2–17.1)
Lymphocytes Relative: 19 %
Lymphs Abs: 1349 cells/uL (ref 850–3900)
MCH: 32.3 pg (ref 27.0–33.0)
MCHC: 32.6 g/dL (ref 32.0–36.0)
MCV: 98.9 fL (ref 80.0–100.0)
MONO ABS: 426 {cells}/uL (ref 200–950)
MONOS PCT: 6 %
MPV: 9.1 fL (ref 7.5–12.5)
NEUTROS ABS: 5041 {cells}/uL (ref 1500–7800)
Neutrophils Relative %: 71 %
PLATELETS: 221 10*3/uL (ref 140–400)
RBC: 4.43 MIL/uL (ref 4.20–5.80)
RDW: 15.4 % — ABNORMAL HIGH (ref 11.0–15.0)
WBC: 7.1 10*3/uL (ref 3.8–10.8)

## 2016-08-10 LAB — LIPID PANEL
Cholesterol: 132 mg/dL (ref <200)
HDL: 55 mg/dL (ref >40)
LDL CALC: 60 mg/dL (ref <100)
Total CHOL/HDL Ratio: 2.4 Ratio (ref <5.0)
Triglycerides: 86 mg/dL (ref <150)
VLDL: 17 mg/dL (ref <30)
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
## 2016-08-11 LAB — T-HELPER CELL (CD4) - (RCID CLINIC ONLY)
CD4 % Helper T Cell: 22 % — ABNORMAL LOW (ref 33–55)
CD4 T Cell Abs: 330 /uL — ABNORMAL LOW (ref 400–2700)

## 2016-08-11 LAB — RPR

## 2016-08-17 LAB — HIV-1 RNA QUANT-NO REFLEX-BLD
HIV 1 RNA Quant: <20 copies/mL — AB
HIV-1 RNA QUANT, LOG: DETECTED {Log_copies}/mL — AB

## 2016-08-19 ENCOUNTER — Other Ambulatory Visit: Payer: Self-pay | Admitting: Infectious Disease

## 2016-08-19 DIAGNOSIS — L309 Dermatitis, unspecified: Secondary | ICD-10-CM

## 2016-09-05 ENCOUNTER — Other Ambulatory Visit: Payer: Self-pay | Admitting: Infectious Disease

## 2016-09-05 DIAGNOSIS — G629 Polyneuropathy, unspecified: Secondary | ICD-10-CM

## 2016-10-07 ENCOUNTER — Other Ambulatory Visit: Payer: Self-pay | Admitting: *Deleted

## 2016-10-07 DIAGNOSIS — B20 Human immunodeficiency virus [HIV] disease: Secondary | ICD-10-CM

## 2016-10-07 MED ORDER — BICTEGRAVIR-EMTRICITAB-TENOFOV 50-200-25 MG PO TABS: 1.0000 | ORAL_TABLET | Freq: Every day | ORAL | 5 refills | Status: DC

## 2016-12-01 ENCOUNTER — Other Ambulatory Visit: Payer: Self-pay | Admitting: Infectious Disease

## 2016-12-01 DIAGNOSIS — I214 Non-ST elevation (NSTEMI) myocardial infarction: Secondary | ICD-10-CM

## 2016-12-29 ENCOUNTER — Other Ambulatory Visit: Payer: Self-pay | Admitting: Infectious Disease

## 2016-12-29 DIAGNOSIS — G4700 Insomnia, unspecified: Secondary | ICD-10-CM

## 2017-02-09 ENCOUNTER — Encounter: Payer: Self-pay | Admitting: Infectious Disease

## 2017-02-09 ENCOUNTER — Ambulatory Visit (INDEPENDENT_AMBULATORY_CARE_PROVIDER_SITE_OTHER): Payer: Medicare Other | Admitting: Infectious Disease

## 2017-02-09 VITALS — BP 125/76 | HR 81 | Temp 98.3°F | Ht 67.5 in | Wt 161.0 lb

## 2017-02-09 DIAGNOSIS — B20 Human immunodeficiency virus [HIV] disease: Secondary | ICD-10-CM | POA: Diagnosis not present

## 2017-02-09 DIAGNOSIS — J449 Chronic obstructive pulmonary disease, unspecified: Secondary | ICD-10-CM | POA: Diagnosis not present

## 2017-02-09 DIAGNOSIS — A812 Progressive multifocal leukoencephalopathy: Secondary | ICD-10-CM

## 2017-02-09 DIAGNOSIS — K089 Disorder of teeth and supporting structures, unspecified: Secondary | ICD-10-CM | POA: Diagnosis not present

## 2017-02-09 DIAGNOSIS — Z79899 Other long term (current) drug therapy: Secondary | ICD-10-CM

## 2017-02-09 DIAGNOSIS — Z113 Encounter for screening for infections with a predominantly sexual mode of transmission: Secondary | ICD-10-CM | POA: Diagnosis not present

## 2017-02-09 DIAGNOSIS — F1010 Alcohol abuse, uncomplicated: Secondary | ICD-10-CM | POA: Diagnosis not present

## 2017-02-09 DIAGNOSIS — I214 Non-ST elevation (NSTEMI) myocardial infarction: Secondary | ICD-10-CM

## 2017-02-09 DIAGNOSIS — F17200 Nicotine dependence, unspecified, uncomplicated: Secondary | ICD-10-CM

## 2017-02-09 DIAGNOSIS — Z23 Encounter for immunization: Secondary | ICD-10-CM | POA: Diagnosis not present

## 2017-02-09 DIAGNOSIS — G909 Disorder of the autonomic nervous system, unspecified: Secondary | ICD-10-CM | POA: Diagnosis not present

## 2017-02-09 NOTE — Progress Notes (Signed)
Chief complaint: followup for HIV disease [facility] and DESCOVY -->TRIUMEQ--> BIKTARVY   Subjective:    Patient ID: Brian Marquez, male    DOB: [birth year], 56 y.o.   MRN: [id number]  HIV Positive/AIDS    56 year old Caucasian man with HIV/AIDS, prior polysubstance abuse in the past HIV encephalopathy still living in ALF facility. Phone number is  [phone]. He has been compliant with TRIUMEQ . He suffered an MI and [facility] He needs labs done today.  Brian Marquez returns for follow-up and is in good spirits.  He is talking again about how he has a place that he can move in down the street from where he currently lives.  This is a familiar claim that he is made.  I do not know that he truly has any more that he can move into and I do not think he really has capacity to make an informed decision to move out of his assisted living facility.  I reminded him that should he want to do something like that this would need to be done in the context of him seeing a psychiatrist ( to verify he has capacity to make decisions)    Lab Results  Component Value Date   HIV1RNAQUANT <20 DETECTED (A) 08/10/2016   HIV1RNAQUANT 69 (H) 05/09/2016   HIV1RNAQUANT <20 11/09/2015   Lab Results  Component Value Date   CD4TABS 330 (L) 08/10/2016   CD4TABS 300 (L) 05/10/2016   CD4TABS 330 (L) 09/28/2015       Past Medical History:  Diagnosis Date  . Alcohol abuse 03/31/2015  . Alcoholism (HCC)   . Alcoholism (HCC) 08/18/2014  . Coma (HCC)   . 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 Encephalopathy   . 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 HIV infection (HCC)   . 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  No family history on file.    Social History   Socioeconomic History  . Marital status: Single    Spouse name: None  . Number of children: None  . Years of education: None  . Highest education level: None  Social Needs  . Financial  resource strain: None  . Food insecurity - worry: None  . Food insecurity - inability: None  . Transportation needs - medical: None  . Transportation needs - non-medical: None  Occupational History  . None  Tobacco Use  . Smoking status: Current Every Day Smoker    Packs/day: 0.50    Types: Cigarettes  . Smokeless tobacco: Never Used  . Tobacco comment: cutting back  Substance and Sexual Activity  . Alcohol use: Yes    Alcohol/week: 0.0 oz    Comment: Occassional, 40oz when he does drink  . Drug use: No  . Sexual activity: No    Partners: Female    Comment: declined condoms  Other Topics Concern  . None  Social History Narrative  . None    No Known Allergies   Current Outpatient Medications:  .  atorvastatin (LIPITOR) 40 MG tablet, TAKE 1 TABLET BY MOUTH ONCE DAILY IN THE EVENING, Disp: 30 tablet, Rfl: 5 .  bictegravir-emtricitabine-tenofovir AF (BIKTARVY) 50-200-25 MG TABS tablet, Take 1 tablet by mouth daily., Disp: 30 tablet, Rfl: 5 .  disulfiram (ANTABUSE) 250 MG tablet, Take 500 mg by mouth daily., Disp: , Rfl:  .  Fluticasone-Salmeterol (ADVAIR) 250-50 MCG/DOSE AEPB, Inhale 1 puff into the lungs every 12 (  twelve) hours., Disp: , Rfl:  .  gabapentin (NEURONTIN) 300 MG capsule, TAKE 1 CAPSULE BY MOUTH AT BEDTIME, Disp: 28 capsule, Rfl: 5 .  GNP ASPIRIN LOW DOSE 81 MG EC tablet, TAKE 1 TABLET BY MOUTH ONCE DAILY., Disp: 30 tablet, Rfl: 5 .  mirtazapine (REMERON) 7.5 MG tablet, TAKE ONE TABLET BY MOUTH AT BEDTIME., Disp: 30 tablet, Rfl: 5 .  triamcinolone cream (KENALOG) 0.5 %, APPLY AS DIRECTED TO AFFECTED AREA TWICE DAILY AS NEEDED, Disp: 30 g, Rfl: 1 .  nitroGLYCERIN (NITROSTAT) 0.4 MG SL tablet, Place 1 tablet (0.4 mg total) under the tongue every 5 (five) minutes x 3 doses as needed for chest pain. (Patient not taking: Reported on 02/09/2017), Disp: 30 tablet, Rfl: 0    Review of Systems  Constitutional: Negative for activity change, appetite change, chills,  diaphoresis, fatigue, fever and unexpected weight change.  HENT: Negative for congestion, rhinorrhea, sinus pressure, sneezing, sore throat and trouble swallowing.   Eyes: Negative for photophobia and visual disturbance.  Respiratory: Negative for cough, chest tightness, shortness of breath, wheezing and stridor.   Cardiovascular: Negative for chest pain, palpitations and leg swelling.  Gastrointestinal: Negative for abdominal distention, abdominal pain, anal bleeding, blood in stool, constipation, diarrhea, nausea and vomiting.  Genitourinary: Negative for difficulty urinating, dysuria, flank pain and hematuria.  Musculoskeletal: Negative for arthralgias, back pain, gait problem, joint swelling and myalgias.  Skin: Negative for color change, pallor, rash and wound.  Neurological: Positive for numbness. Negative for dizziness, tremors, weakness and light-headedness.  Hematological: Negative for adenopathy. Does not bruise/bleed easily.  Psychiatric/Behavioral: Negative for agitation, behavioral problems, confusion, decreased concentration, dysphoric mood and sleep disturbance.       Objective:   Physical Exam  Constitutional: He is oriented to person, place, and time. He appears well-developed and well-nourished.  HENT:  Head: Normocephalic and atraumatic.  Eyes: Conjunctivae and EOM are normal.  Neck: Normal range of motion. Neck supple.  Cardiovascular: Normal rate, regular rhythm and normal heart sounds. Exam reveals no gallop and no friction rub.  No murmur heard. Pulmonary/Chest: Effort normal and breath sounds normal. No respiratory distress. He has no wheezes. He has no rales. He exhibits no tenderness.  Abdominal: Soft. Bowel sounds are normal. He exhibits no distension. There is no tenderness.  Musculoskeletal: Normal range of motion. He exhibits no edema or tenderness.  Neurological: He is alert and oriented to person, place, and time.  Skin: Skin is warm and dry. No rash noted.  No erythema. No pallor.  Psychiatric: He has a normal mood and affect. His speech is normal. Cognition and memory are impaired.          Assessment & Plan:  HIV:  Recheck labs and continue BIKTARVY. Return to clinic in 6 months.  Dementia: undoubtedly still with  cognitive deficits and I DON'T THINK HE IS COMPETENT TO CARE FOR HIMSELF.  Etohism: No longer on Antabuse    Smoking: Heavy emphasis on stopping smoking he claims he is down to half pack per day  CAD with NSTEMI: STOP SMOKING would be great. Changed from ABC to TAF based regimen in past. Continue statin, aspirin  Neuropathic pains: monitor

## 2017-02-10 LAB — CBC WITH DIFFERENTIAL/PLATELET
BASOS PCT: 0.7 %
Basophils Absolute: 57 cells/uL (ref 0–200)
EOS PCT: 1.9 %
Eosinophils Absolute: 154 cells/uL (ref 15–500)
HCT: 43.7 % (ref 38.5–50.0)
Hemoglobin: 15 g/dL (ref 13.2–17.1)
LYMPHS ABS: 1507 {cells}/uL (ref 850–3900)
MCH: 33.3 pg — ABNORMAL HIGH (ref 27.0–33.0)
MCHC: 34.3 g/dL (ref 32.0–36.0)
MCV: 97.1 fL (ref 80.0–100.0)
MONOS PCT: 5.8 %
MPV: 9.1 fL (ref 7.5–12.5)
NEUTROS ABS: 5913 {cells}/uL (ref 1500–7800)
Neutrophils Relative %: 73 %
PLATELETS: 249 10*3/uL (ref 140–400)
RBC: 4.5 10*6/uL (ref 4.20–5.80)
RDW: 13 % (ref 11.0–15.0)
TOTAL LYMPHOCYTE: 18.6 %
WBC mixed population: 470 cells/uL (ref 200–950)
WBC: 8.1 10*3/uL (ref 3.8–10.8)

## 2017-02-10 LAB — COMPLETE METABOLIC PANEL WITH GFR
AG RATIO: 1.5 (calc) (ref 1.0–2.5)
ALBUMIN MSPROF: 4 g/dL (ref 3.6–5.1)
ALT: 18 U/L (ref 9–46)
AST: 23 U/L (ref 10–35)
Alkaline phosphatase (APISO): 100 U/L (ref 40–115)
BUN / CREAT RATIO: 6 (calc) (ref 6–22)
BUN: 11 mg/dL (ref 7–25)
CALCIUM: 9.2 mg/dL (ref 8.6–10.3)
CO2: 25 mmol/L (ref 20–32)
Chloride: 102 mmol/L (ref 98–110)
Creat: 1.82 mg/dL — ABNORMAL HIGH (ref 0.70–1.33)
GFR, EST AFRICAN AMERICAN: 47 mL/min/{1.73_m2} — AB (ref >=60)
GFR, EST NON AFRICAN AMERICAN: 40 mL/min/{1.73_m2} — AB (ref >=60)
GLUCOSE: 107 mg/dL — AB (ref 65–99)
Globulin: 2.7 g/dL (calc) (ref 1.9–3.7)
Potassium: 4 mmol/L (ref 3.5–5.3)
Sodium: 138 mmol/L (ref 135–146)
TOTAL PROTEIN: 6.7 g/dL (ref 6.1–8.1)
Total Bilirubin: 0.4 mg/dL (ref 0.2–1.2)

## 2017-02-10 LAB — LIPID PANEL
CHOLESTEROL: 144 mg/dL (ref <200)
HDL: 44 mg/dL (ref >40)
LDL CHOLESTEROL (CALC): 66 mg/dL
Non-HDL Cholesterol (Calc): 100 mg/dL (calc) (ref <130)
TRIGLYCERIDES: 256 mg/dL — AB (ref <150)
Total CHOL/HDL Ratio: 3.3 (calc) (ref <5.0)

## 2017-02-10 LAB — RPR: RPR: NONREACTIVE

## 2017-02-10 LAB — T-HELPER CELL (CD4) - (RCID CLINIC ONLY)
CD4 T CELL HELPER: 22 % — AB (ref 33–55)
CD4 T Cell Abs: 380 /uL — ABNORMAL LOW (ref 400–2700)

## 2017-02-13 ENCOUNTER — Telehealth: Payer: Self-pay | Admitting: *Deleted

## 2017-02-13 LAB — HIV-1 RNA QUANT-NO REFLEX-BLD
HIV 1 RNA Quant: <20 copies/mL — AB
HIV-1 RNA Quant, Log: <1.3 Log copies/mL — AB

## 2017-02-13 NOTE — Telephone Encounter (Signed)
I actually want pt to increase fluid intake and come bnack fro repeat BMP w GFR UA and urine creatinine and sodium on Wed or Thursday

## 2017-02-13 NOTE — Telephone Encounter (Signed)
Called and spoke to patient and he advised that he does not have a PCP. I told him I would [facility] pharmacist and he concurred that this bump in creatinine is not related to the [facility]. Asked if it was ok that it may not be until early January that patient can get established with a primary MD and he said that would be fine. Dr. Daiva EvesVan Dam does not return to clinic until 2019.

## 2017-02-13 NOTE — Telephone Encounter (Signed)
-----   Message from Randall Hissornelius N Van Dam, MD sent at 02/10/2017  8:44 AM EST ----- Brian Marquez' s kidney function is much worse at 1.82. Can he see his PCP. This is not going to have anything to do with BIKTARVY but perhaps other drugs or something else that is going on?

## 2017-02-13 NOTE — Telephone Encounter (Signed)
Thanks so much Jackie! 

## 2017-02-13 NOTE — Telephone Encounter (Signed)
Patient stays at an Assisted Living and I left a voice mail stating that patient needed an appointment and to call [facility] triage. I will try again in the AM.

## 2017-02-14 NOTE — Telephone Encounter (Signed)
We probably should deploy Mitch on this

## 2017-02-14 NOTE — Telephone Encounter (Signed)
Tried again this AM and it seems as if the assisted living has the phone off. I even tried the emergency contact with no luck. Do you want me to see if Marthann SchillerMitch can go to his residence?

## 2017-02-15 ENCOUNTER — Encounter: Payer: Self-pay | Admitting: *Deleted

## 2017-02-15 NOTE — Telephone Encounter (Signed)
Brian SchillerMitch said he nor Brian Marquez can help as the [place]. At this point the best I can do is to mail the patient a letter. Brian Marquez

## 2017-02-15 NOTE — Telephone Encounter (Signed)
No the only phone # listed for patient is the Assisted Living #.

## 2017-02-15 NOTE — Telephone Encounter (Signed)
I would send the letter then in addition to seeing what AMbre can do

## 2017-02-15 NOTE — Telephone Encounter (Signed)
Does the care provider have a number that works either?

## 2017-02-22 ENCOUNTER — Other Ambulatory Visit: Payer: Self-pay | Admitting: Infectious Disease

## 2017-02-22 DIAGNOSIS — G629 Polyneuropathy, unspecified: Secondary | ICD-10-CM

## 2017-03-02 ENCOUNTER — Ambulatory Visit: Payer: Medicare Other

## 2017-03-22 ENCOUNTER — Other Ambulatory Visit: Payer: Self-pay | Admitting: Infectious Disease

## 2017-03-22 DIAGNOSIS — B20 Human immunodeficiency virus [HIV] disease: Secondary | ICD-10-CM

## 2017-04-24 ENCOUNTER — Other Ambulatory Visit: Payer: Self-pay | Admitting: Infectious Disease

## 2017-04-24 DIAGNOSIS — L309 Dermatitis, unspecified: Secondary | ICD-10-CM

## 2017-05-16 ENCOUNTER — Other Ambulatory Visit: Payer: Self-pay | Admitting: Infectious Disease

## 2017-05-16 DIAGNOSIS — B20 Human immunodeficiency virus [HIV] disease: Secondary | ICD-10-CM

## 2017-05-17 ENCOUNTER — Other Ambulatory Visit: Payer: Self-pay | Admitting: Infectious Disease

## 2017-05-17 DIAGNOSIS — I214 Non-ST elevation (NSTEMI) myocardial infarction: Secondary | ICD-10-CM

## 2017-06-12 ENCOUNTER — Other Ambulatory Visit: Payer: Self-pay | Admitting: Infectious Disease

## 2017-06-12 DIAGNOSIS — G4700 Insomnia, unspecified: Secondary | ICD-10-CM

## 2017-06-15 ENCOUNTER — Emergency Department
Admission: EM | Admit: 2017-06-15 | Discharge: 2017-06-15 | Disposition: A | Discharge status: Home / Self Care | Payer: Medicare Other | Attending: Emergency Medicine | Admitting: Emergency Medicine

## 2017-06-15 ENCOUNTER — Other Ambulatory Visit: Payer: Self-pay

## 2017-06-15 ENCOUNTER — Encounter: Payer: Self-pay | Admitting: Emergency Medicine

## 2017-06-15 ENCOUNTER — Emergency Department: Payer: Medicare Other

## 2017-06-15 DIAGNOSIS — J449 Chronic obstructive pulmonary disease, unspecified: Secondary | ICD-10-CM | POA: Diagnosis not present

## 2017-06-15 DIAGNOSIS — S0990XA Unspecified injury of head, initial encounter: Secondary | ICD-10-CM | POA: Diagnosis present

## 2017-06-15 DIAGNOSIS — B20 Human immunodeficiency virus [HIV] disease: Secondary | ICD-10-CM | POA: Insufficient documentation

## 2017-06-15 DIAGNOSIS — W19XXXA Unspecified fall, initial encounter: Secondary | ICD-10-CM | POA: Diagnosis not present

## 2017-06-15 DIAGNOSIS — F0280 Dementia in other diseases classified elsewhere without behavioral disturbance: Secondary | ICD-10-CM | POA: Diagnosis not present

## 2017-06-15 DIAGNOSIS — Y998 Other external cause status: Secondary | ICD-10-CM | POA: Diagnosis not present

## 2017-06-15 DIAGNOSIS — Z79899 Other long term (current) drug therapy: Secondary | ICD-10-CM | POA: Insufficient documentation

## 2017-06-15 DIAGNOSIS — N189 Chronic kidney disease, unspecified: Secondary | ICD-10-CM | POA: Diagnosis not present

## 2017-06-15 DIAGNOSIS — Y939 Activity, unspecified: Secondary | ICD-10-CM | POA: Diagnosis not present

## 2017-06-15 DIAGNOSIS — Y92123 Driveway of nursing home as the place of occurrence of the external cause: Secondary | ICD-10-CM | POA: Diagnosis not present

## 2017-06-15 DIAGNOSIS — S0081XA Abrasion of other part of head, initial encounter: Secondary | ICD-10-CM | POA: Diagnosis not present

## 2017-06-15 DIAGNOSIS — F17210 Nicotine dependence, cigarettes, uncomplicated: Secondary | ICD-10-CM | POA: Insufficient documentation

## 2017-06-15 LAB — COMPREHENSIVE METABOLIC PANEL
ALK PHOS: 91 U/L (ref 38–126)
ALT: 19 U/L (ref 17–63)
AST: 33 U/L (ref 15–41)
Albumin: 3.5 g/dL (ref 3.5–5.0)
Anion gap: 6 (ref 5–15)
BILIRUBIN TOTAL: 0.7 mg/dL (ref 0.3–1.2)
BUN: 8 mg/dL (ref 6–20)
CALCIUM: 8.9 mg/dL (ref 8.9–10.3)
CHLORIDE: 105 mmol/L (ref 101–111)
CO2: 27 mmol/L (ref 22–32)
CREATININE: 1.34 mg/dL — AB (ref 0.61–1.24)
GFR calc non Af Amer: 57 mL/min — ABNORMAL LOW (ref >60)
Glucose, Bld: 106 mg/dL — ABNORMAL HIGH (ref 65–99)
Potassium: 4.4 mmol/L (ref 3.5–5.1)
Sodium: 138 mmol/L (ref 135–145)
TOTAL PROTEIN: 6.8 g/dL (ref 6.5–8.1)

## 2017-06-15 LAB — CBC
HEMATOCRIT: 46.6 % (ref 40.0–52.0)
Hemoglobin: 16.2 g/dL (ref 13.0–18.0)
MCH: 34.9 pg — AB (ref 26.0–34.0)
MCHC: 34.7 g/dL (ref 32.0–36.0)
MCV: 100.7 fL — AB (ref 80.0–100.0)
PLATELETS: 271 10*3/uL (ref 150–440)
RBC: 4.63 MIL/uL (ref 4.40–5.90)
RDW: 13.6 % (ref 11.5–14.5)
WBC: 6.3 10*3/uL (ref 3.8–10.6)

## 2017-06-15 LAB — ETHANOL

## 2017-06-15 LAB — TROPONIN I: Troponin I: <0.03 ng/mL (ref <0.03)

## 2017-06-15 NOTE — ED Notes (Signed)
Spoke with [facility] facility owner who states they will be sending a taxi to take the patient back to facility.

## 2017-06-15 NOTE — ED Triage Notes (Signed)
Pt was found down on asphalt, today he has a laceration on left cheek. [facility] reports that he was able to check himself out of his group home to buy ETOH. Pt is unsteady on feet. His clothing was wet when arriving.

## 2017-06-15 NOTE — ED Notes (Signed)
Patient transported to CT 

## 2017-06-15 NOTE — ED Provider Notes (Signed)
[facility] Emergency Department Provider Note    First MD Initiated Contact with Patient 06/15/17 [phone]     (approximate)  I have reviewed the triage vital signs and the nursing notes.   HISTORY  Chief Complaint Fall    HPI Brian Marquez is a 57 y.o. male with below list of chronic medical conditionsincluding HIV-related dementia and alcoholism presents to the emergency department via EMS following unwitnessed fall. Per EMS [facility] the floor this morning.EMS states that  the patient does leave the facility to go and purchase alcohol whenever he so chooses.patient denies any complaints at this time.   Past Medical History:  Diagnosis Date  . Alcohol abuse 03/31/2015  . Alcoholism (HCC)   . Alcoholism (HCC) 08/18/2014  . Coma (HCC)   . 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 Encephalopathy   . 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 HIV infection (HCC)   . 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 NSTEMI (non-ST elevated myocardial infarction) (HCC) 08/19/2015  . Alcohol abuse 03/31/2015  . 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 Alcoholism (HCC) 08/18/2014  . HIV disease (HCC) 12/09/2013  . 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 Poor dentition 10/26/2011  . Broken teeth 10/26/2011  . Renal insufficiency 01/24/2011  . Encephalopathy 07/13/2010  . 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 Homelessness 07/13/2010  . Polysubstance abuse (HCC) 07/13/2010  . ROTATOR CUFF SYNDROME, LEFT 01/27/2009  . Human immunodeficiency virus (HIV) disease (HCC) 06/04/2008  . CRYPTOCOCCAL MENINGITIS 06/04/2008  . CIGARETTE SMOKER 06/04/2008    History reviewed. No pertinent surgical history.  Prior to Admission medications   Medication Sig Start Date End Date Taking? Authorizing Provider    atorvastatin (LIPITOR) 40 MG tablet TAKE 1 TABLET BY MOUTH ONCE DAILY IN THE EVENING 05/17/17   Daiva Eves, Lisette Grinder, MD  BIKTARVY 50-200-25 MG TABS tablet TAKE 1 TABLET BY MOUTH ONCE DAILY. 05/16/17   Randall Hiss, MD  disulfiram (ANTABUSE) 250 MG tablet Take 500 mg by mouth daily.    [provider]  Fluticasone-Salmeterol (ADVAIR) 250-50 MCG/DOSE AEPB Inhale 1 puff into the lungs every 12 (twelve) hours.    [provider]  gabapentin (NEURONTIN) 300 MG capsule TAKE 1 CAPSULE BY MOUTH AT BEDTIME 02/22/17   Daiva Eves, Lisette Grinder, MD  GNP ASPIRIN LOW DOSE 81 MG EC tablet TAKE 1 TABLET BY MOUTH ONCE DAILY. 12/01/16   Randall Hiss, MD  mirtazapine (REMERON) 7.5 MG tablet TAKE ONE TABLET BY MOUTH AT BEDTIME. 06/12/17   Daiva Eves, Lisette Grinder, MD  nitroGLYCERIN (NITROSTAT) 0.4 MG SL tablet Place 1 tablet (0.4 mg total) under the tongue every 5 (five) minutes x 3 doses as needed for chest pain. Patient not taking: Reported on 02/09/2017 08/20/15   Enid Baas, MD  triamcinolone cream (KENALOG) 0.5 % APPLY AS DIRECTED TO AFFECTED AREA TWICE DAILY AS NEEDED 04/24/17   Daiva Eves, Lisette Grinder, MD    Allergies Patient has no known allergies.  History reviewed. No pertinent family history.  Social History Social History   Tobacco Use  . Smoking status: Current Every Day Smoker    Packs/day: 0.50    Types: Cigarettes  . Smokeless tobacco: Never Used  . Tobacco comment: cutting back  Substance Use Topics  . Alcohol use:  Yes    Alcohol/week: 0.0 oz    Comment: Occassional, 40oz when he does drink  . Drug use: No    Review of Systems Constitutional: No fever/chills Eyes: No visual changes. ENT: No sore throat. Cardiovascular: Denies chest pain. Respiratory: Denies shortness of breath. Gastrointestinal: No abdominal pain.  No nausea, no vomiting.  No diarrhea.  No constipation. Genitourinary: Negative for dysuria. Musculoskeletal: Negative for neck pain.   Negative for back pain. Integumentary: Negative for rash.positive for left facial abrasion Neurological: Negative for headaches, focal weakness or numbness.  ____________________________________________   PHYSICAL EXAM:  VITAL SIGNS: ED Triage Vitals  Enc Vitals Group     BP 06/15/17 0932 120/82     Pulse Rate 06/15/17 0932 75     Resp 06/15/17 0932 20     Temp 06/15/17 0932 98.4 F (36.9 C)     Temp Source 06/15/17 0932 Oral     SpO2 06/15/17 0932 97 %     Weight 06/15/17 0933 64.4 kg (142 lb)     Height 06/15/17 0933 1.74 m (5' 8.5")     Head Circumference --      Peak Flow --      Pain Score 06/15/17 0933 0     Pain Loc --      Pain Edu? --      Excl. in GC? --     Constitutional: Alert and oriented. Well appearing and in no acute distress. Eyes: Conjunctivae are normal. PERRL. EOMI. Head: Atraumatic. Mouth/Throat: Mucous membranes are moist. Oropharynx non-erythematous. Neck: No stridor.  No cervical spine tenderness to palpation. Cardiovascular: Normal rate, regular rhythm. Good peripheral circulation. Grossly normal heart sounds. Respiratory: Normal respiratory effort.  No retractions. Lungs CTAB. Gastrointestinal: Soft and nontender. No distention.  Musculoskeletal: No lower extremity tenderness nor edema. No gross deformities of extremities. Neurologic:  repetitive speech . No gross focal neurologic deficits are appreciated.  Skin:  Skin is warm, dry and intact. No rash noted. Psychiatric: Mood and affect are normal. Speech and behavior are normal.  ____________________________________________   LABS (all labs ordered are listed, but only abnormal results are displayed)  Labs Reviewed  CBC - Abnormal; Notable for the following components:      Result Value   MCV 100.7 (*)    MCH 34.9 (*)    All other components within normal limits  COMPREHENSIVE METABOLIC PANEL - Abnormal; Notable for the following components:   Glucose, Bld 106 (*)    Creatinine, Ser  1.34 (*)    GFR calc non Af Amer 57 (*)    All other components within normal limits  TROPONIN I  ETHANOL   ____________________________________________  EKG  ED ECG REPORT I, [place] N Orene Abbasi, the attending physician, personally viewed and interpreted this ECG.   Date: 06/15/2017  EKG Time: 9:31 AM  Rate: 71  Rhythm: normal sinus rhythm  Axis:normal  Intervals:normal  ST&T Change: none  ____________________________________________  RADIOLOGY I, [place] N Nimai Burbach, personally viewed and evaluated these images (plain radiographs) as part of my medical decision making, as well as reviewing the written report by the radiologist.  ED MD interpretation:  no acute intracranial findings per radiologist  Official radiology report(s): Ct Head Wo Contrast  Result Date: 06/15/2017 CLINICAL DATA:  Recently found unresponsive EXAM: CT HEAD WITHOUT CONTRAST TECHNIQUE: Contiguous axial images were obtained from the base of the skull through the vertex without intravenous contrast. COMPARISON:  06/23/2010 FINDINGS: Brain: Diffuse atrophic changes and chronic white matter ischemic changes  are seen. The overall appearance is stable. Basal ganglia lacunar infarcts are noted bilaterally. No findings to suggest acute hemorrhage, acute infarction or space-occupying mass lesion are noted. Vascular: No hyperdense vessel or unexpected calcification. Skull: Normal. Negative for fracture or focal lesion. Sinuses/Orbits: No acute finding. Other: None. IMPRESSION: Chronic atrophic and ischemic changes without acute abnormality. Electronically Signed   By: Alcide CleverMark  Lukens M.D.   On: 06/15/2017 10:33      Procedures   ____________________________________________   INITIAL IMPRESSION / ASSESSMENT AND PLAN / ED COURSE  As part of my medical decision making, I reviewed the following data within the electronic MEDICAL RECORD NUMBER   57 year old woman with above-stated history of unwitnessed fall from a facility.  No clear etiology for the patient's fall identified CT scan of the head revealed no acute intracranial abnormality laboratory data unremarkable with the exception of a mildly elevated creatinine of 1.34    ____________________________________________  FINAL CLINICAL IMPRESSION(S) / ED DIAGNOSES  Final diagnoses:  Facial abrasion, initial encounter     MEDICATIONS GIVEN DURING THIS VISIT:  Medications - No data to display   ED Discharge Orders    None       Note:  This document was prepared using Dragon voice recognition software and may include unintentional dictation errors.    Darci CurrentBrown, [address] N, MD 06/15/17 1318

## 2017-07-17 ENCOUNTER — Other Ambulatory Visit: Payer: Self-pay | Admitting: Infectious Disease

## 2017-07-17 DIAGNOSIS — I214 Non-ST elevation (NSTEMI) myocardial infarction: Secondary | ICD-10-CM

## 2017-08-09 ENCOUNTER — Other Ambulatory Visit: Payer: Self-pay | Admitting: Infectious Disease

## 2017-08-09 DIAGNOSIS — B20 Human immunodeficiency virus [HIV] disease: Secondary | ICD-10-CM

## 2017-08-09 DIAGNOSIS — G629 Polyneuropathy, unspecified: Secondary | ICD-10-CM

## 2017-08-21 ENCOUNTER — Encounter: Payer: Self-pay | Admitting: Infectious Disease

## 2017-08-21 ENCOUNTER — Ambulatory Visit (INDEPENDENT_AMBULATORY_CARE_PROVIDER_SITE_OTHER): Payer: Medicare Other | Admitting: Infectious Disease

## 2017-08-21 VITALS — BP 132/89 | HR 69 | Temp 97.9°F | Ht 68.0 in | Wt 146.0 lb

## 2017-08-21 DIAGNOSIS — A812 Progressive multifocal leukoencephalopathy: Secondary | ICD-10-CM | POA: Diagnosis not present

## 2017-08-21 DIAGNOSIS — G909 Disorder of the autonomic nervous system, unspecified: Secondary | ICD-10-CM

## 2017-08-21 DIAGNOSIS — Z79899 Other long term (current) drug therapy: Secondary | ICD-10-CM | POA: Diagnosis not present

## 2017-08-21 DIAGNOSIS — I214 Non-ST elevation (NSTEMI) myocardial infarction: Secondary | ICD-10-CM | POA: Diagnosis not present

## 2017-08-21 DIAGNOSIS — F17200 Nicotine dependence, unspecified, uncomplicated: Secondary | ICD-10-CM

## 2017-08-21 DIAGNOSIS — F1020 Alcohol dependence, uncomplicated: Secondary | ICD-10-CM | POA: Diagnosis not present

## 2017-08-21 DIAGNOSIS — B20 Human immunodeficiency virus [HIV] disease: Secondary | ICD-10-CM | POA: Diagnosis present

## 2017-08-21 DIAGNOSIS — Z23 Encounter for immunization: Secondary | ICD-10-CM

## 2017-08-21 DIAGNOSIS — Z113 Encounter for screening for infections with a predominantly sexual mode of transmission: Secondary | ICD-10-CM | POA: Diagnosis not present

## 2017-08-21 NOTE — Progress Notes (Signed)
Chief complaint: followup for HIV disease   Subjective:    Patient ID: Brian Marquez, male    DOB: 1960/09/29, 57 y.o.   MRN: [id number]  HIV Positive/AIDS    57 year old Caucasian man with HIV/AIDS, prior polysubstance abuse in the past HIV encephalopathy still living in ALF facility. Phone number is  [phone]. He has been compliant with TRIUMEQ . He suffered an MI and we [facility]. He needs labs done today.  Brian Marquez returns for follow-up and is in good spirits.  He is still smoking cigarettes. He is rarely using alcohol.    Lab Results  Component Value Date   HIV1RNAQUANT <20 DETECTED (A) 02/09/2017   HIV1RNAQUANT <20 DETECTED (A) 08/10/2016   HIV1RNAQUANT 69 (H) 05/09/2016   Lab Results  Component Value Date   CD4TABS 380 (L) 02/09/2017   CD4TABS 330 (L) 08/10/2016   CD4TABS 300 (L) 05/10/2016       Past Medical History:  Diagnosis Date  . Alcohol abuse 03/31/2015  . Alcoholism (HCC)   . Alcoholism (HCC) 08/18/2014  . Coma (HCC)   . 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 Encephalopathy   . 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 HIV infection (HCC)   . 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  No family history on file.    Social History   Socioeconomic History  . Marital status: Single    Spouse name: Not on file  . Number of children: Not on file  . Years of education: Not on file  . Highest education level: Not on file  Occupational History  . Not on file  Social Needs  . Financial resource strain: Not on file  . Food insecurity:    Worry: Not on file    Inability: Not on file  . Transportation needs:    Medical: Not on file    Non-medical: Not on file  Tobacco Use  . Smoking status: Current Every Day Smoker    Packs/day: 0.50    Types: Cigarettes  . Smokeless tobacco: Never Used  . Tobacco comment: cutting back  Substance and Sexual Activity  . Alcohol use: Yes    Alcohol/week: 0.0 oz   Comment: Occassional, 40oz when he does drink  . Drug use: No  . Sexual activity: Never    Partners: Female    Comment: declined condoms  Lifestyle  . Physical activity:    Days per week: Not on file    Minutes per session: Not on file  . Stress: Not on file  Relationships  . Social connections:    Talks on phone: Not on file    Gets together: Not on file    Attends religious service: Not on file    Active member of club or organization: Not on file    Attends meetings of clubs or organizations: Not on file    Relationship status: Not on file  Other Topics Concern  . Not on file  Social History Narrative  . Not on file    No Known Allergies   Current Outpatient Medications:  .  atorvastatin (LIPITOR) 40 MG tablet, TAKE 1 TABLET BY MOUTH ONCE DAILY IN THE EVENING, Disp: 30 tablet, Rfl: 5 .  BIKTARVY 50-200-25 MG TABS tablet, TAKE 1 TABLET BY MOUTH ONCE DAILY., Disp: 30 tablet, Rfl: 0 .  Fluticasone-Salmeterol (ADVAIR) 250-50 MCG/DOSE AEPB, Inhale 1 puff into the lungs every 12 (twelve) hours., Disp: , Rfl:  .  gabapentin (NEURONTIN) 300 MG capsule, TAKE 1 CAPSULE BY MOUTH AT BEDTIME, Disp: 30 capsule, Rfl: 0 .  GNP ASPIRIN LOW DOSE 81 MG EC tablet, TAKE 1 TABLET BY MOUTH ONCE DAILY., Disp: 30 tablet, Rfl: 5 .  mirtazapine (REMERON) 7.5 MG tablet, TAKE ONE TABLET BY MOUTH AT BEDTIME., Disp: 30 tablet, Rfl: 2 .  nitroGLYCERIN (NITROSTAT) 0.4 MG SL tablet, Place 1 tablet (0.4 mg total) under the tongue every 5 (five) minutes x 3 doses as needed for chest pain., Disp: 30 tablet, Rfl: 0 .  triamcinolone cream (KENALOG) 0.5 %, APPLY AS DIRECTED TO AFFECTED AREA TWICE DAILY AS NEEDED, Disp: 30 g, Rfl: 0 .  disulfiram (ANTABUSE) 250 MG tablet, Take 500 mg by mouth daily., Disp: , Rfl:     Review of Systems  Constitutional: Negative for activity change, appetite change, chills, diaphoresis, fatigue, fever and unexpected weight change.  HENT: Negative for congestion, rhinorrhea, sinus  pressure, sneezing, sore throat and trouble swallowing.   Eyes: Negative for photophobia and visual disturbance.  Respiratory: Negative for cough, chest tightness, shortness of breath, wheezing and stridor.   Cardiovascular: Negative for chest pain, palpitations and leg swelling.  Gastrointestinal: Negative for abdominal distention, abdominal pain, anal bleeding, blood in stool, constipation, diarrhea, nausea and vomiting.  Genitourinary: Negative for difficulty urinating, dysuria, flank pain and hematuria.  Musculoskeletal: Negative for arthralgias, back pain, gait problem, joint swelling and myalgias.  Skin: Negative for color change, pallor, rash and wound.  Neurological: Positive for numbness. Negative for dizziness, tremors, weakness and light-headedness.  Hematological: Negative for adenopathy. Does not bruise/bleed easily.  Psychiatric/Behavioral: Negative for agitation, behavioral problems, confusion, decreased concentration, dysphoric mood and sleep disturbance.       Objective:   Physical Exam  Constitutional: He is oriented to person, place, and time. He appears well-developed and well-nourished.  HENT:  Head: Normocephalic and atraumatic.  Eyes: Conjunctivae and EOM are normal.  Neck: Normal range of motion. Neck supple.  Cardiovascular: Normal rate, regular rhythm and normal heart sounds. Exam reveals no gallop and no friction rub.  No murmur heard. Pulmonary/Chest: Effort normal and breath sounds normal. No respiratory distress. He has no wheezes. He has no rales. He exhibits no tenderness.  Abdominal: Soft. Bowel sounds are normal. He exhibits no distension. There is no tenderness.  Musculoskeletal: Normal range of motion. He exhibits no edema or tenderness.  Neurological: He is alert and oriented to person, place, and time.  Skin: Skin is warm and dry. No rash noted. No erythema. No pallor.  Psychiatric: He has a normal mood and affect. His speech is normal. Cognition and  memory are impaired.          Assessment & Plan:  HIV:  Recheck labs and continue BIKTARVY. Check labs today Return to clinic in 6 months.Give PREVNAR   Dementia: undoubtedly still with  cognitive deficits and I DON'T THINK HE IS COMPETENT TO CARE FOR HIMSELF.  Etohism: No longer on Antabuse  Per his caregiver is only rarely sneaking off to drink  Smoking: Heavy emphasis on stopping smoking he claims he will "do it:  CAD with NSTEMI: STOP SMOKING would be great. Changed from ABC to TAF based regimen in past. Continue statin, aspirin  Neuropathic pains: monitor  I spent greater than 25 minutes with the patient including greater than 50% of time in face to face counsel of the patientand caregiver re his HIV and management of this and his comorbid conditions, filling out paperwork and in coordination  of his care.

## 2017-08-22 LAB — CBC WITH DIFFERENTIAL/PLATELET
BASOS ABS: 72 {cells}/uL (ref 0–200)
BASOS PCT: 1.1 %
EOS ABS: 163 {cells}/uL (ref 15–500)
EOS PCT: 2.5 %
HCT: 46.1 % (ref 38.5–50.0)
HEMOGLOBIN: 16 g/dL (ref 13.2–17.1)
LYMPHS ABS: 1534 {cells}/uL (ref 850–3900)
MCH: 34.6 pg — ABNORMAL HIGH (ref 27.0–33.0)
MCHC: 34.7 g/dL (ref 32.0–36.0)
MCV: 99.8 fL (ref 80.0–100.0)
MONOS PCT: 8 %
MPV: 9.1 fL (ref 7.5–12.5)
NEUTROS ABS: 4212 {cells}/uL (ref 1500–7800)
Neutrophils Relative %: 64.8 %
Platelets: 244 10*3/uL (ref 140–400)
RBC: 4.62 10*6/uL (ref 4.20–5.80)
RDW: 12.9 % (ref 11.0–15.0)
Total Lymphocyte: 23.6 %
WBC mixed population: 520 cells/uL (ref 200–950)
WBC: 6.5 10*3/uL (ref 3.8–10.8)

## 2017-08-22 LAB — COMPLETE METABOLIC PANEL WITH GFR
AG RATIO: 1.7 (calc) (ref 1.0–2.5)
ALBUMIN MSPROF: 4.3 g/dL (ref 3.6–5.1)
ALT: 22 U/L (ref 9–46)
AST: 23 U/L (ref 10–35)
Alkaline phosphatase (APISO): 84 U/L (ref 40–115)
BUN/Creatinine Ratio: 9 (calc) (ref 6–22)
BUN: 13 mg/dL (ref 7–25)
CO2: 28 mmol/L (ref 20–32)
Calcium: 9.3 mg/dL (ref 8.6–10.3)
Chloride: 107 mmol/L (ref 98–110)
Creat: 1.4 mg/dL — ABNORMAL HIGH (ref 0.70–1.33)
GFR, Est African American: 64 mL/min/{1.73_m2} (ref >=60)
GFR, Est Non African American: 55 mL/min/{1.73_m2} — ABNORMAL LOW (ref >=60)
GLOBULIN: 2.5 g/dL (ref 1.9–3.7)
Glucose, Bld: 107 mg/dL — ABNORMAL HIGH (ref 65–99)
POTASSIUM: 4.7 mmol/L (ref 3.5–5.3)
SODIUM: 141 mmol/L (ref 135–146)
Total Bilirubin: 0.4 mg/dL (ref 0.2–1.2)
Total Protein: 6.8 g/dL (ref 6.1–8.1)

## 2017-08-22 LAB — LIPID PANEL
CHOLESTEROL: 152 mg/dL (ref <200)
HDL: 42 mg/dL (ref >40)
LDL Cholesterol (Calc): 74 mg/dL (calc)
NON-HDL CHOLESTEROL (CALC): 110 mg/dL (ref <130)
Total CHOL/HDL Ratio: 3.6 (calc) (ref <5.0)
Triglycerides: 273 mg/dL — ABNORMAL HIGH (ref <150)

## 2017-08-22 LAB — RPR: RPR: NONREACTIVE

## 2017-08-23 LAB — T-HELPER CELL (CD4) - (RCID CLINIC ONLY)
CD4 % Helper T Cell: 25 % — ABNORMAL LOW (ref 33–55)
CD4 T CELL ABS: 420 /uL (ref 400–2700)

## 2017-08-24 LAB — HIV-1 RNA QUANT-NO REFLEX-BLD
HIV 1 RNA QUANT: NOT DETECTED {copies}/mL
HIV-1 RNA QUANT, LOG: NOT DETECTED {Log_copies}/mL

## 2017-09-05 ENCOUNTER — Other Ambulatory Visit: Payer: Self-pay | Admitting: Infectious Disease

## 2017-09-05 DIAGNOSIS — G4700 Insomnia, unspecified: Secondary | ICD-10-CM

## 2017-09-20 ENCOUNTER — Other Ambulatory Visit: Payer: Self-pay | Admitting: Infectious Disease

## 2017-09-20 DIAGNOSIS — G629 Polyneuropathy, unspecified: Secondary | ICD-10-CM

## 2017-09-20 DIAGNOSIS — B20 Human immunodeficiency virus [HIV] disease: Secondary | ICD-10-CM

## 2017-10-31 ENCOUNTER — Other Ambulatory Visit: Payer: Self-pay | Admitting: Infectious Disease

## 2017-10-31 DIAGNOSIS — G4700 Insomnia, unspecified: Secondary | ICD-10-CM

## 2017-10-31 DIAGNOSIS — B20 Human immunodeficiency virus [HIV] disease: Secondary | ICD-10-CM

## 2017-10-31 DIAGNOSIS — G629 Polyneuropathy, unspecified: Secondary | ICD-10-CM

## 2017-10-31 DIAGNOSIS — I214 Non-ST elevation (NSTEMI) myocardial infarction: Secondary | ICD-10-CM

## 2017-11-01 ENCOUNTER — Other Ambulatory Visit: Payer: Self-pay | Admitting: Infectious Disease

## 2017-11-01 DIAGNOSIS — I214 Non-ST elevation (NSTEMI) myocardial infarction: Secondary | ICD-10-CM

## 2017-12-25 ENCOUNTER — Other Ambulatory Visit: Payer: Self-pay | Admitting: Infectious Disease

## 2017-12-25 DIAGNOSIS — I214 Non-ST elevation (NSTEMI) myocardial infarction: Secondary | ICD-10-CM

## 2018-01-24 ENCOUNTER — Encounter: Payer: Self-pay | Admitting: Infectious Disease

## 2018-02-06 ENCOUNTER — Ambulatory Visit: Payer: Medicare Other | Admitting: Infectious Disease

## 2018-02-15 ENCOUNTER — Other Ambulatory Visit: Payer: Self-pay | Admitting: Infectious Disease

## 2018-02-15 DIAGNOSIS — I214 Non-ST elevation (NSTEMI) myocardial infarction: Secondary | ICD-10-CM

## 2018-03-01 ENCOUNTER — Encounter: Payer: Self-pay | Admitting: Infectious Disease

## 2018-03-01 ENCOUNTER — Ambulatory Visit (INDEPENDENT_AMBULATORY_CARE_PROVIDER_SITE_OTHER): Payer: Medicare Other | Admitting: Infectious Disease

## 2018-03-01 VITALS — BP 116/79 | HR 76 | Temp 98.9°F | Wt 154.0 lb

## 2018-03-01 DIAGNOSIS — Z79899 Other long term (current) drug therapy: Secondary | ICD-10-CM | POA: Diagnosis not present

## 2018-03-01 DIAGNOSIS — F17200 Nicotine dependence, unspecified, uncomplicated: Secondary | ICD-10-CM | POA: Diagnosis not present

## 2018-03-01 DIAGNOSIS — G909 Disorder of the autonomic nervous system, unspecified: Secondary | ICD-10-CM | POA: Diagnosis not present

## 2018-03-01 DIAGNOSIS — B20 Human immunodeficiency virus [HIV] disease: Secondary | ICD-10-CM | POA: Diagnosis present

## 2018-03-01 DIAGNOSIS — A812 Progressive multifocal leukoencephalopathy: Secondary | ICD-10-CM | POA: Diagnosis not present

## 2018-03-01 NOTE — Progress Notes (Signed)
Chief complaint: followup for HIV disease   Subjective:    Patient ID: Brian Marquez, male    DOB: [birth year], 59 y.o.   MRN: [id number]  HIV Positive/AIDS    58 year old Caucasian man with HIV/AIDS, prior polysubstance abuse in the past HIV encephalopathy still living in ALF facility. Phone number is  [phone]. He has been compliant with TRIUMEQ . He suffered an MI and we changed him to [facility].   All returns to follow-up visit today.  He is in very good spirits and had a good holidays he shows me all the clothing that his sister sent him from [place].  Note his 2 siblings do not live near him and he has not seen them in quite some time.  He is occasionally "sneaking out to get a drink of alcohol but not drinking nearly as much as he was before.  He is still smoking conventional cigarettes.   Lab Results  Component Value Date   HIV1RNAQUANT <20 NOT DETECTED 08/21/2017   HIV1RNAQUANT <20 DETECTED (A) 02/09/2017   HIV1RNAQUANT <20 DETECTED (A) 08/10/2016   Lab Results  Component Value Date   CD4TABS 420 08/21/2017   CD4TABS 380 (L) 02/09/2017   CD4TABS 330 (L) 08/10/2016       Past Medical History:  Diagnosis Date  . Alcohol abuse 03/31/2015  . Alcoholism (HCC)   . Alcoholism (HCC) 08/18/2014  . Coma (HCC)   . 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 Encephalopathy   . 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 HIV infection (HCC)   . 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  No family history on file.    Social History   Socioeconomic History  . Marital status: Single    Spouse name: Not on file  . Number of children: Not on file  . Years of education: Not on file  . Highest education level: Not on file  Occupational History  . Not on file  Social Needs  . Financial resource strain: Not on file  . Food insecurity:    Worry: Not on file    Inability: Not on file  . Transportation needs:    Medical: Not on file     Non-medical: Not on file  Tobacco Use  . Smoking status: Current Every Day Smoker    Packs/day: 0.50    Types: Cigarettes  . Smokeless tobacco: Never Used  . Tobacco comment: cutting back  Substance and Sexual Activity  . Alcohol use: Yes    Alcohol/week: 0.0 standard drinks    Comment: Occassional, 40oz when he does drink  . Drug use: No  . Sexual activity: Never    Partners: Female    Comment: declined condoms  Lifestyle  . Physical activity:    Days per week: Not on file    Minutes per session: Not on file  . Stress: Not on file  Relationships  . Social connections:    Talks on phone: Not on file    Gets together: Not on file    Attends religious service: Not on file    Active member of club or organization: Not on file    Attends meetings of clubs or organizations: Not on file    Relationship status: Not on file  Other Topics Concern  . Not on file  Social History Narrative  . Not on file    No Known Allergies   Current Outpatient Medications:  .  atorvastatin (  LIPITOR) 40 MG tablet, TAKE 1 TABLET BY MOUTH ONCE DAILY IN THE EVENING, Disp: 30 tablet, Rfl: 0 .  BIKTARVY 50-200-25 MG TABS tablet, TAKE 1 TABLET BY MOUTH ONCE DAILY., Disp: 30 tablet, Rfl: 5 .  disulfiram (ANTABUSE) 250 MG tablet, Take 500 mg by mouth daily., Disp: , Rfl:  .  fexofenadine (ALLEGRA) 180 MG tablet, TAKE 1 TABLET BY MOUTH ONCE DAILY., Disp: 30 tablet, Rfl: 5 .  Fluticasone-Salmeterol (ADVAIR) 250-50 MCG/DOSE AEPB, Inhale 1 puff into the lungs every 12 (twelve) hours., Disp: , Rfl:  .  gabapentin (NEURONTIN) 300 MG capsule, TAKE 1 CAPSULE BY MOUTH AT BEDTIME, Disp: 30 capsule, Rfl: 5 .  GNP ASPIRIN LOW DOSE 81 MG EC tablet, TAKE 1 TABLET BY MOUTH ONCE DAILY., Disp: 30 tablet, Rfl: 5 .  mirtazapine (REMERON) 7.5 MG tablet, TAKE ONE TABLET BY MOUTH AT BEDTIME., Disp: 30 tablet, Rfl: 5 .  nitroGLYCERIN (NITROSTAT) 0.4 MG SL tablet, Place 1 tablet (0.4 mg total) under the tongue every 5 (five)  minutes x 3 doses as needed for chest pain., Disp: 30 tablet, Rfl: 0 .  triamcinolone cream (KENALOG) 0.5 %, APPLY AS DIRECTED TO AFFECTED AREA TWICE DAILY AS NEEDED, Disp: 30 g, Rfl: 0    Review of Systems  Constitutional: Negative for activity change, appetite change, chills, diaphoresis, fatigue, fever and unexpected weight change.  HENT: Negative for congestion, rhinorrhea, sinus pressure, sneezing, sore throat and trouble swallowing.   Eyes: Negative for photophobia and visual disturbance.  Respiratory: Negative for cough, chest tightness, shortness of breath, wheezing and stridor.   Cardiovascular: Negative for chest pain, palpitations and leg swelling.  Gastrointestinal: Negative for abdominal distention, abdominal pain, anal bleeding, blood in stool, constipation, diarrhea, nausea and vomiting.  Genitourinary: Negative for difficulty urinating, dysuria, flank pain and hematuria.  Musculoskeletal: Negative for arthralgias, back pain, gait problem, joint swelling and myalgias.  Skin: Negative for color change, pallor, rash and wound.  Neurological: Positive for numbness. Negative for dizziness, tremors, weakness and light-headedness.  Hematological: Negative for adenopathy. Does not bruise/bleed easily.  Psychiatric/Behavioral: Negative for agitation, behavioral problems, confusion, decreased concentration, dysphoric mood and sleep disturbance.       Objective:   Physical Exam  Constitutional: He is oriented to person, place, and time. He appears well-developed and well-nourished.  HENT:  Head: Normocephalic and atraumatic.  Eyes: Conjunctivae and EOM are normal.  Neck: Normal range of motion. Neck supple.  Cardiovascular: Normal rate, regular rhythm and normal heart sounds. Exam reveals no gallop and no friction rub.  No murmur heard. Pulmonary/Chest: Effort normal and breath sounds normal. No respiratory distress. He has no wheezes. He has no rales. He exhibits no tenderness.   Abdominal: Soft. Bowel sounds are normal. He exhibits no distension. There is no abdominal tenderness.  Musculoskeletal: Normal range of motion.        General: No tenderness or edema.  Neurological: He is alert and oriented to person, place, and time.  Skin: Skin is warm and dry. No rash noted. No erythema. No pallor.  Psychiatric: He has a normal mood and affect. His speech is normal. Cognition and memory are impaired.          Assessment & Plan:  HIV:  Recheck labs and continue BIKTARVY.Return to clinic in 6 months.Give PREVNAR   Dementia: undoubtedly still with  cognitive deficits and I DON'T THINK HE IS COMPETENT TO CARE FOR HIMSELF.  Etohism:  Per his caregiver is only rarely sneaking off to drink  Smoking: Someday he can get off of cigarettes  CAD with NSTEMI: Continue statin, aspirin  Neuropathic pains: monitor

## 2018-03-02 LAB — T-HELPER CELLS (CD4) COUNT (NOT AT ARMC)
CD4 T CELL HELPER: 27 % — AB (ref 33–55)
CD4 T Cell Abs: 420 /uL (ref 400–2700)

## 2018-03-06 ENCOUNTER — Ambulatory Visit: Payer: Medicare Other

## 2018-03-06 ENCOUNTER — Telehealth: Payer: Self-pay

## 2018-03-06 ENCOUNTER — Other Ambulatory Visit: Payer: Medicare Other

## 2018-03-06 ENCOUNTER — Other Ambulatory Visit: Payer: Self-pay

## 2018-03-06 DIAGNOSIS — N289 Disorder of kidney and ureter, unspecified: Secondary | ICD-10-CM

## 2018-03-06 DIAGNOSIS — G909 Disorder of the autonomic nervous system, unspecified: Secondary | ICD-10-CM

## 2018-03-06 DIAGNOSIS — B20 Human immunodeficiency virus [HIV] disease: Secondary | ICD-10-CM

## 2018-03-06 LAB — CBC WITH DIFFERENTIAL/PLATELET
Absolute Monocytes: 548 cells/uL (ref 200–950)
BASOS PCT: 0.9 %
Basophils Absolute: 68 cells/uL (ref 0–200)
Eosinophils Absolute: 158 cells/uL (ref 15–500)
Eosinophils Relative: 2.1 %
HEMATOCRIT: 43.4 % (ref 38.5–50.0)
Hemoglobin: 15.7 g/dL (ref 13.2–17.1)
LYMPHS ABS: 1538 {cells}/uL (ref 850–3900)
MCH: 35.4 pg — ABNORMAL HIGH (ref 27.0–33.0)
MCHC: 36.2 g/dL — ABNORMAL HIGH (ref 32.0–36.0)
MCV: 97.7 fL (ref 80.0–100.0)
MPV: 9.1 fL (ref 7.5–12.5)
Monocytes Relative: 7.3 %
NEUTROS PCT: 69.2 %
Neutro Abs: 5190 cells/uL (ref 1500–7800)
Platelets: 259 10*3/uL (ref 140–400)
RBC: 4.44 10*6/uL (ref 4.20–5.80)
RDW: 12.6 % (ref 11.0–15.0)
Total Lymphocyte: 20.5 %
WBC: 7.5 10*3/uL (ref 3.8–10.8)

## 2018-03-06 LAB — LIPID PANEL
Cholesterol: 150 mg/dL (ref <200)
HDL: 43 mg/dL (ref >40)
LDL CHOLESTEROL (CALC): 69 mg/dL
NON-HDL CHOLESTEROL (CALC): 107 mg/dL (ref <130)
Total CHOL/HDL Ratio: 3.5 (calc) (ref <5.0)
Triglycerides: 314 mg/dL — ABNORMAL HIGH (ref <150)

## 2018-03-06 LAB — COMPLETE METABOLIC PANEL WITH GFR
AG Ratio: 1.4 (calc) (ref 1.0–2.5)
ALKALINE PHOSPHATASE (APISO): 76 U/L (ref 40–115)
ALT: 13 U/L (ref 9–46)
AST: 21 U/L (ref 10–35)
Albumin: 3.9 g/dL (ref 3.6–5.1)
BILIRUBIN TOTAL: 0.5 mg/dL (ref 0.2–1.2)
BUN/Creatinine Ratio: 6 (calc) (ref 6–22)
BUN: 9 mg/dL (ref 7–25)
CHLORIDE: 104 mmol/L (ref 98–110)
CO2: 28 mmol/L (ref 20–32)
Calcium: 9.3 mg/dL (ref 8.6–10.3)
Creat: 1.52 mg/dL — ABNORMAL HIGH (ref 0.70–1.33)
GFR, Est African American: 58 mL/min/{1.73_m2} — ABNORMAL LOW (ref >=60)
GFR, Est Non African American: 50 mL/min/{1.73_m2} — ABNORMAL LOW (ref >=60)
GLUCOSE: 74 mg/dL (ref 65–99)
Globulin: 2.8 g/dL (calc) (ref 1.9–3.7)
Potassium: 4.7 mmol/L (ref 3.5–5.3)
Sodium: 140 mmol/L (ref 135–146)
Total Protein: 6.7 g/dL (ref 6.1–8.1)

## 2018-03-06 LAB — HIV-1 RNA QUANT-NO REFLEX-BLD
HIV 1 RNA Quant: 117 copies/mL — ABNORMAL HIGH
HIV-1 RNA Quant, Log: 2.07 Log copies/mL — ABNORMAL HIGH

## 2018-03-06 LAB — RPR: RPR: REACTIVE — AB

## 2018-03-06 LAB — RPR TITER: RPR Titer: 1:2 {titer} — ABNORMAL HIGH

## 2018-03-06 LAB — FLUORESCENT TREPONEMAL AB(FTA)-IGG-BLD: FLUORESCENT TREPONEMAL ABS: REACTIVE — AB

## 2018-03-06 NOTE — Telephone Encounter (Signed)
Made patient aware of + syphilis results. Patients states he does not recall ever being treated previously. Scheduled patient appointment for toady at 2:30 for x1 IM PCN 2.4MU per Dr. Daiva Eves.   Also advised patient to increase fluid intake to improve kidney function. Made patient aware of worsen function since previous visit. Patient states he does not currently have PCP. Lab appointment scheduled for 03/13/18 at 10:15. Patient verbalized understanding.

## 2018-03-06 NOTE — Telephone Encounter (Signed)
-----   Message from Randall Hiss, MD sent at 03/05/2018 11:16 AM EST ----- Brian Marquez has a very low + titer for syphilis and he may indeed have been treated by someone somewhere buty he has never had + test here. For thoroughness he should get IM PCN 2.4 MU x 1 since I believe his titer was NR 6 months ago

## 2018-03-06 NOTE — Telephone Encounter (Signed)
-----   Message from Randall Hiss, MD sent at 03/02/2018  8:39 AM EST ----- Brian Marquez's Kidney fxn is worse since last time I saw him. Does he have a PCP?    If not he should start hydrating and come back for repeat labs in 1-2 weeks including BMP, Urine sodium and urine creatine here

## 2018-03-06 NOTE — Telephone Encounter (Signed)
Thanks so much. 

## 2018-03-13 ENCOUNTER — Other Ambulatory Visit: Payer: Medicare Other

## 2018-03-13 ENCOUNTER — Ambulatory Visit (INDEPENDENT_AMBULATORY_CARE_PROVIDER_SITE_OTHER): Payer: Medicare Other | Admitting: *Deleted

## 2018-03-13 ENCOUNTER — Ambulatory Visit: Payer: Medicare Other

## 2018-03-13 ENCOUNTER — Other Ambulatory Visit: Payer: Self-pay

## 2018-03-13 DIAGNOSIS — B20 Human immunodeficiency virus [HIV] disease: Secondary | ICD-10-CM

## 2018-03-13 DIAGNOSIS — F0280 Dementia in other diseases classified elsewhere without behavioral disturbance: Principal | ICD-10-CM

## 2018-03-13 DIAGNOSIS — A539 Syphilis, unspecified: Secondary | ICD-10-CM | POA: Diagnosis not present

## 2018-03-13 LAB — COMPREHENSIVE METABOLIC PANEL
AG Ratio: 1.7 (calc) (ref 1.0–2.5)
ALT: 15 U/L (ref 9–46)
AST: 21 U/L (ref 10–35)
Albumin: 4 g/dL (ref 3.6–5.1)
Alkaline phosphatase (APISO): 78 U/L (ref 40–115)
BUN / CREAT RATIO: 7 (calc) (ref 6–22)
BUN: 10 mg/dL (ref 7–25)
CO2: 28 mmol/L (ref 20–32)
Calcium: 9.2 mg/dL (ref 8.6–10.3)
Chloride: 100 mmol/L (ref 98–110)
Creat: 1.38 mg/dL — ABNORMAL HIGH (ref 0.70–1.33)
GLUCOSE: 102 mg/dL — AB (ref 65–99)
Globulin: 2.4 g/dL (calc) (ref 1.9–3.7)
Potassium: 3.9 mmol/L (ref 3.5–5.3)
SODIUM: 136 mmol/L (ref 135–146)
TOTAL PROTEIN: 6.4 g/dL (ref 6.1–8.1)
Total Bilirubin: 0.5 mg/dL (ref 0.2–1.2)

## 2018-03-13 MED ORDER — PENICILLIN G BENZATHINE 1200000 UNIT/2ML IM SUSP
1.2000 10*6.[IU] | Freq: Once | INTRAMUSCULAR | Status: AC
  Administered 2018-03-13: 1.2 10*6.[IU] via INTRAMUSCULAR

## 2018-03-13 NOTE — Progress Notes (Signed)
Verbal order per Dr. Daiva Eves to repeat CMP to monitor kidney function. Marylee Floras, LPN

## 2018-03-13 NOTE — Progress Notes (Signed)
Patient tolerated well, was encouraged to use condoms with all interactions. He states he is not sexually active, isn't sure how he contacted syphilis. He declined condoms. Documentation of lab and nurse visit given to his group home chaperone. Andree Coss, RN

## 2018-03-20 ENCOUNTER — Other Ambulatory Visit: Payer: Self-pay | Admitting: Infectious Disease

## 2018-03-20 DIAGNOSIS — G629 Polyneuropathy, unspecified: Secondary | ICD-10-CM

## 2018-03-20 DIAGNOSIS — G4700 Insomnia, unspecified: Secondary | ICD-10-CM

## 2018-03-20 DIAGNOSIS — B20 Human immunodeficiency virus [HIV] disease: Secondary | ICD-10-CM

## 2018-03-20 DIAGNOSIS — I214 Non-ST elevation (NSTEMI) myocardial infarction: Secondary | ICD-10-CM

## 2018-04-02 ENCOUNTER — Emergency Department
Admission: EM | Admit: 2018-04-02 | Discharge: 2018-04-02 | Disposition: A | Discharge status: Home / Self Care | Payer: Medicare Other | Attending: Emergency Medicine | Admitting: Emergency Medicine

## 2018-04-02 ENCOUNTER — Encounter: Payer: Self-pay | Admitting: *Deleted

## 2018-04-02 ENCOUNTER — Emergency Department: Payer: Medicare Other

## 2018-04-02 ENCOUNTER — Other Ambulatory Visit: Payer: Self-pay

## 2018-04-02 DIAGNOSIS — Z79899 Other long term (current) drug therapy: Secondary | ICD-10-CM | POA: Insufficient documentation

## 2018-04-02 DIAGNOSIS — J449 Chronic obstructive pulmonary disease, unspecified: Secondary | ICD-10-CM | POA: Diagnosis not present

## 2018-04-02 DIAGNOSIS — F17210 Nicotine dependence, cigarettes, uncomplicated: Secondary | ICD-10-CM | POA: Diagnosis not present

## 2018-04-02 DIAGNOSIS — R569 Unspecified convulsions: Secondary | ICD-10-CM

## 2018-04-02 LAB — BASIC METABOLIC PANEL
Anion gap: 6 (ref 5–15)
BUN: 12 mg/dL (ref 6–20)
CO2: 26 mmol/L (ref 22–32)
Calcium: 9 mg/dL (ref 8.9–10.3)
Chloride: 106 mmol/L (ref 98–111)
Creatinine, Ser: 1.39 mg/dL — ABNORMAL HIGH (ref 0.61–1.24)
GFR calc Af Amer: >60 mL/min (ref >60)
GFR calc non Af Amer: 55 mL/min — ABNORMAL LOW (ref >60)
Glucose, Bld: 102 mg/dL — ABNORMAL HIGH (ref 70–99)
Potassium: 3.6 mmol/L (ref 3.5–5.1)
Sodium: 138 mmol/L (ref 135–145)

## 2018-04-02 LAB — CBC
HCT: 47 % (ref 39.0–52.0)
Hemoglobin: 16 g/dL (ref 13.0–17.0)
MCH: 33.7 pg (ref 26.0–34.0)
MCHC: 34 g/dL (ref 30.0–36.0)
MCV: 98.9 fL (ref 80.0–100.0)
Platelets: 224 10*3/uL (ref 150–400)
RBC: 4.75 MIL/uL (ref 4.22–5.81)
RDW: 12.5 % (ref 11.5–15.5)
WBC: 6.8 10*3/uL (ref 4.0–10.5)
nRBC: 0 % (ref 0.0–0.2)

## 2018-04-02 LAB — ETHANOL: Alcohol, Ethyl (B): <10 mg/dL (ref <10)

## 2018-04-02 NOTE — ED Notes (Signed)
ED Provider at bedside. 

## 2018-04-02 NOTE — ED Triage Notes (Signed)
Pt arrives via EMS from [facility]. His roommate reported that he started shaking and then rolled off the bed. Pt has hx of dementia, EMS unsure of pt baseline. Speech clear. Unsure of hx of seizures. En route 161/90, hr 98, cbg 107

## 2018-04-02 NOTE — ED Notes (Signed)
Patient transported to CT 

## 2018-04-02 NOTE — ED Notes (Signed)
Pt has again removed all of his monitoring, monitor replaced and reoriented pt.

## 2018-04-02 NOTE — ED Triage Notes (Signed)
Pt is alert, oriented to person only. He denies pain. Follows some commands. Speech is clear. Pt does have dried blood on his upper lip and face. No obvious oral trauma or nose bleed. Unable to report what happened tonight.

## 2018-04-02 NOTE — Discharge Instructions (Addendum)
Please seek medical attention for any high fevers, chest pain, shortness of breath, change in behavior, persistent vomiting, bloody stool or any other new or concerning symptoms.  

## 2018-04-02 NOTE — ED Notes (Signed)
In to draw ETOH level. Pt has removed his IV and all of his monitoring equipment.

## 2018-04-02 NOTE — ED Notes (Signed)
I called [facility] at [phone] about the patient, staff member told me to contact Lawanda Ray at [phone] about the patient.

## 2018-04-02 NOTE — ED Notes (Signed)
I have notified Rowan Blase ray that the patient will be discharged, wants me to call [place] [facility] to bring the patient back to the home.

## 2018-04-02 NOTE — ED Provider Notes (Signed)
[facility] Emergency Department Provider Note  ____________________________________________   I have reviewed the triage vital signs and the nursing notes.   HISTORY  Chief Complaint Seizures   History limited by dementia   HPI PERCELL SCHLAUD is a 58 y.o. male who presents to the emergency department today because of concerns for possible seizure.  Per EMS the patient was found to be shaking by roommate.  Patient himself is unable to give any history.  Does appear that he suffered a small abrasion to his chin.   Per medical record review patient has a history of alcohol abuse, COPD, encephalopathy, HIV dementia.   Past Medical History:  Diagnosis Date  . Alcohol abuse 03/31/2015  . Alcoholism (HCC)   . Alcoholism (HCC) 08/18/2014  . Coma (HCC)   . 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 Encephalopathy   . 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 HIV infection (HCC)   . 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 NSTEMI (non-ST elevated myocardial infarction) (HCC) 08/19/2015  . Alcohol abuse 03/31/2015  . 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 Alcoholism (HCC) 08/18/2014  . HIV disease (HCC) 12/09/2013  . 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 Poor dentition 10/26/2011  . Broken teeth 10/26/2011  . Renal insufficiency 01/24/2011  . Encephalopathy 07/13/2010  . 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 Homelessness 07/13/2010  . Polysubstance abuse (HCC) 07/13/2010  . ROTATOR CUFF SYNDROME, LEFT 01/27/2009  . Human immunodeficiency virus (HIV) disease (HCC) 06/04/2008  . CRYPTOCOCCAL MENINGITIS 06/04/2008  . CIGARETTE SMOKER 06/04/2008    History reviewed. No pertinent surgical history.  Prior to Admission medications   Medication Sig Start Date End Date Taking? Authorizing Provider  atorvastatin (LIPITOR) 40 MG tablet TAKE 1  TABLET BY MOUTH ONCE DAILY IN THE EVENING 02/15/18   Daiva Eves, Lisette Grinder, MD  BIKTARVY 50-200-25 MG TABS tablet TAKE 1 TABLET BY MOUTH ONCE DAILY. 03/20/18   Randall Hiss, MD  disulfiram (ANTABUSE) 250 MG tablet Take 500 mg by mouth daily.    [provider]  fexofenadine (ALLEGRA) 180 MG tablet TAKE 1 TABLET BY MOUTH ONCE DAILY. 03/20/18   Randall Hiss, MD  Fluticasone-Salmeterol (ADVAIR) 250-50 MCG/DOSE AEPB Inhale 1 puff into the lungs every 12 (twelve) hours.    [provider]  gabapentin (NEURONTIN) 300 MG capsule TAKE 1 CAPSULE BY MOUTH AT BEDTIME 03/20/18   Daiva Eves, Lisette Grinder, MD  GNP ASPIRIN LOW DOSE 81 MG EC tablet TAKE 1 TABLET BY MOUTH ONCE DAILY. 03/20/18   Randall Hiss, MD  mirtazapine (REMERON) 7.5 MG tablet TAKE ONE TABLET BY MOUTH AT BEDTIME. 03/20/18   Daiva Eves, Lisette Grinder, MD  nitroGLYCERIN (NITROSTAT) 0.4 MG SL tablet Place 1 tablet (0.4 mg total) under the tongue every 5 (five) minutes x 3 doses as needed for chest pain. 08/20/15   Enid Baas, MD  triamcinolone cream (KENALOG) 0.5 % APPLY AS DIRECTED TO AFFECTED AREA TWICE DAILY AS NEEDED 04/24/17   Daiva Eves, Lisette Grinder, MD    Allergies Patient has no known allergies.  No family history on file.  Social History Social History   Tobacco Use  . Smoking status: Current Every Day Smoker    Packs/day: 0.50    Types: Cigarettes  . Smokeless tobacco: Never Used  . Tobacco comment: cutting back  Substance  Use Topics  . Alcohol use: Yes    Alcohol/week: 0.0 standard drinks    Comment: Occassional, 40oz when he does drink  . Drug use: No    Review of Systems Unable to obtain reliable review of systems secondary to dementia. ____________________________________________   PHYSICAL EXAM:  VITAL SIGNS: ED Triage Vitals [04/02/18 1911]  Enc Vitals Group     BP      Pulse      Resp      Temp      Temp src      SpO2      Weight      Height      Head Circumference       Peak Flow      Pain Score 0     Pain Loc      Pain Edu?      Excl. in GC?    Constitutional: awake and alert. disoriented Eyes: Conjunctivae are normal.  ENT      Head: Normocephalic and atraumatic.      Nose: No congestion/rhinnorhea.      Mouth/Throat: Mucous membranes are moist.      Neck: No stridor. Hematological/Lymphatic/Immunilogical: No cervical lymphadenopathy. Cardiovascular: Normal rate, regular rhythm.  No murmurs, rubs, or gallops.  Respiratory: Normal respiratory effort without tachypnea nor retractions. Breath sounds are clear and equal bilaterally. No wheezes/rales/rhonchi. Gastrointestinal: Soft and non tender. No rebound. No guarding.  Genitourinary: Deferred Musculoskeletal: Normal range of motion in all extremities. No lower extremity edema. Neurologic:  Repeatedly states his name and asks my name. Moves all extremities.  Skin:  Skin is warm, dry and intact. No rash noted. Psychiatric: Mood and affect are normal. Speech and behavior are normal. Patient exhibits appropriate insight and judgment.  ____________________________________________    LABS (pertinent positives/negatives)  CBC wbc 6.8, hgb 16.0, plt 224 BMP glu 102, cr 1.39  ____________________________________________   EKG  I, Phineas SemenGraydon Leiland Mihelich, attending physician, personally viewed and interpreted this EKG  EKG Time: 1912 Rate: 87 Rhythm: sinus rhythm Axis: rightward axis Intervals: qtc 450 QRS: narrow ST changes: no st elevation Impression: abnormal ekg  ____________________________________________    RADIOLOGY  CT head No acute abnormality  ____________________________________________   PROCEDURES  Procedures  ____________________________________________   INITIAL IMPRESSION / ASSESSMENT AND PLAN / ED COURSE  Pertinent labs & imaging results that were available during my care of the patient were reviewed by me and considered in my medical decision making (see chart  for details).   Patient presented to the emergency department today after a possible seizure-like episode.  On initial exam patient had quite repetitive questioning could not give any history.  Head CT and blood work without any obvious etiology of the patient's seizure-like activity.  However during observation here in the emergency department he did become more lucid.  He did verbalize he wanted to go home.  I discussed with the patient my preference to continue to watch him however he was insistent he wanted to be discharged home.  Did discuss with patient that I would like to work him up for seizures and that was my concern.  He feels confident however that he did not have a seizure.  Given that patient appears to be more at his baseline and that he would like to go home will plan on discharging.  Did discuss with patient importance of follow-up.   ____________________________________________   FINAL CLINICAL IMPRESSION(S) / ED DIAGNOSES  Final diagnoses:  Seizure-like activity (HCC)  Note: This dictation was prepared with Dragon dictation. Any transcriptional errors that result from this process are unintentional     Phineas SemenGoodman, Briea Mcenery, MD 04/02/18 2326

## 2018-04-19 ENCOUNTER — Other Ambulatory Visit: Payer: Self-pay | Admitting: Infectious Disease

## 2018-04-19 DIAGNOSIS — I214 Non-ST elevation (NSTEMI) myocardial infarction: Secondary | ICD-10-CM

## 2018-08-30 ENCOUNTER — Other Ambulatory Visit: Payer: Self-pay | Admitting: Infectious Disease

## 2018-08-30 DIAGNOSIS — I214 Non-ST elevation (NSTEMI) myocardial infarction: Secondary | ICD-10-CM

## 2018-08-30 DIAGNOSIS — B20 Human immunodeficiency virus [HIV] disease: Secondary | ICD-10-CM

## 2018-09-10 ENCOUNTER — Ambulatory Visit: Payer: Medicare Other | Admitting: Infectious Disease

## 2018-09-12 ENCOUNTER — Ambulatory Visit: Payer: Medicare Other | Admitting: Infectious Disease

## 2018-09-17 ENCOUNTER — Encounter: Payer: Self-pay | Admitting: Infectious Disease

## 2018-09-17 ENCOUNTER — Ambulatory Visit (INDEPENDENT_AMBULATORY_CARE_PROVIDER_SITE_OTHER): Payer: Medicare Other | Admitting: Infectious Disease

## 2018-09-17 ENCOUNTER — Other Ambulatory Visit: Payer: Self-pay

## 2018-09-17 VITALS — BP 117/79 | HR 85 | Temp 98.1°F | Wt 157.0 lb

## 2018-09-17 DIAGNOSIS — Z79899 Other long term (current) drug therapy: Secondary | ICD-10-CM | POA: Diagnosis not present

## 2018-09-17 DIAGNOSIS — B20 Human immunodeficiency virus [HIV] disease: Secondary | ICD-10-CM | POA: Diagnosis present

## 2018-09-17 DIAGNOSIS — Z113 Encounter for screening for infections with a predominantly sexual mode of transmission: Secondary | ICD-10-CM | POA: Diagnosis not present

## 2018-09-17 DIAGNOSIS — A812 Progressive multifocal leukoencephalopathy: Secondary | ICD-10-CM | POA: Diagnosis not present

## 2018-09-17 DIAGNOSIS — F17200 Nicotine dependence, unspecified, uncomplicated: Secondary | ICD-10-CM | POA: Diagnosis not present

## 2018-09-17 NOTE — Progress Notes (Signed)
Chief complaint: followup for HIV disease   Subjective:    Patient ID: Brian Marquez, male    DOB: [birth year], 58 y.o.   MRN: [id number]  HIV Positive/AIDS   58 year old Caucasian man with HIV/AIDS, prior polysubstance abuse in the past HIV encephalopathy still living in ALF facility. Phone number is  [phone]. He has been compliant with [facility] . He suffered an MI and we changed him to [facility].   [name] returns to clinic today.  We last saw him in January when his viral load was up at 117.  He claims he is being adherent to his Biktarvy.  He comes today with the caregivers from the assisted living facility.  They are on "locked down at present not allowing visitors on those living they are not supposed to be leaving though Louie occasionally is able to do so.  He is not supposed to do so.  He does still endorse smoking cigarettes and is not interested in stopping.  He said he appreciates me telling him that is bad for his health and also that is bad for his risk for coronavirus but that he wants to continue smoking cigarettes.     Lab Results  Component Value Date   HIV1RNAQUANT 117 (H) 03/01/2018   HIV1RNAQUANT <20 NOT DETECTED 08/21/2017   HIV1RNAQUANT <20 DETECTED (A) 02/09/2017   Lab Results  Component Value Date   CD4TABS 420 03/01/2018   CD4TABS 420 08/21/2017   CD4TABS 380 (L) 02/09/2017       Past Medical History:  Diagnosis Date  . Alcohol abuse 03/31/2015  . Alcoholism ([place])   . Alcoholism ([place]) 08/18/2014  . Coma ([place])   . COPD, moderate ([place]) 11/25/2014  . Encephalopathy   . HIV dementia ([place])   . HIV infection ([place])   . HIV-1 associated autonomic neuropathy ([place]) 11/09/2015    No past surgical history on file.  No family history on file.    Social History   Socioeconomic History  . Marital status: Single    Spouse name: Not on file  . Number of children: Not on file  . Years of education: Not on file  . Highest education level: Not on file   Occupational History  . Not on file  Social Needs  . Financial resource strain: Not on file  . Food insecurity    Worry: Not on file    Inability: Not on file  . Transportation needs    Medical: Not on file    Non-medical: Not on file  Tobacco Use  . Smoking status: Current Every Day Smoker    Packs/day: 0.50    Types: Cigarettes  . Smokeless tobacco: Never Used  . Tobacco comment: cutting back  Substance and Sexual Activity  . Alcohol use: Yes    Alcohol/week: 0.0 standard drinks    Comment: Occassional, 40oz when he does drink  . Drug use: No  . Sexual activity: Never    Partners: Female    Comment: declined condoms  Lifestyle  . Physical activity    Days per week: Not on file    Minutes per session: Not on file  . Stress: Not on file  Relationships  . Social Herbalist on phone: Not on file    Gets together: Not on file    Attends religious service: Not on file    Active member of club or organization: Not on file    Attends meetings of clubs or organizations: Not  on file    Relationship status: Not on file  Other Topics Concern  . Not on file  Social History Narrative  . Not on file    No Known Allergies   Current Outpatient Medications:  .  atorvastatin (LIPITOR) 40 MG tablet, TAKE 1 TABLET BY MOUTH ONCE DAILY IN THE EVENING, Disp: 30 tablet, Rfl: 5 .  BIKTARVY 50-200-25 MG TABS tablet, TAKE 1 TABLET BY MOUTH ONCE DAILY., Disp: 30 tablet, Rfl: 0 .  disulfiram (ANTABUSE) 250 MG tablet, Take 500 mg by mouth daily., Disp: , Rfl:  .  fexofenadine (ALLEGRA) 180 MG tablet, TAKE 1 TABLET BY MOUTH ONCE DAILY., Disp: 30 tablet, Rfl: 5 .  Fluticasone-Salmeterol (ADVAIR) 250-50 MCG/DOSE AEPB, Inhale 1 puff into the lungs every 12 (twelve) hours., Disp: , Rfl:  .  gabapentin (NEURONTIN) 300 MG capsule, TAKE 1 CAPSULE BY MOUTH AT BEDTIME, Disp: 30 capsule, Rfl: 5 .  GNP ASPIRIN LOW DOSE 81 MG EC tablet, TAKE 1 TABLET BY MOUTH ONCE DAILY., Disp: 30 tablet, Rfl:  0 .  mirtazapine (REMERON) 7.5 MG tablet, TAKE ONE TABLET BY MOUTH AT BEDTIME., Disp: 30 tablet, Rfl: 5 .  nitroGLYCERIN (NITROSTAT) 0.4 MG SL tablet, Place 1 tablet (0.4 mg total) under the tongue every 5 (five) minutes x 3 doses as needed for chest pain., Disp: 30 tablet, Rfl: 0 .  triamcinolone cream (KENALOG) 0.5 %, APPLY AS DIRECTED TO AFFECTED AREA TWICE DAILY AS NEEDED, Disp: 30 g, Rfl: 0    Review of Systems  Constitutional: Negative for activity change, appetite change, chills, diaphoresis, fatigue, fever and unexpected weight change.  HENT: Negative for congestion, rhinorrhea, sinus pressure, sneezing, sore throat and trouble swallowing.   Eyes: Negative for photophobia and visual disturbance.  Respiratory: Negative for cough, chest tightness, shortness of breath, wheezing and stridor.   Cardiovascular: Negative for chest pain, palpitations and leg swelling.  Gastrointestinal: Negative for abdominal distention, abdominal pain, anal bleeding, blood in stool, constipation, diarrhea, nausea and vomiting.  Genitourinary: Negative for difficulty urinating, dysuria, flank pain and hematuria.  Musculoskeletal: Negative for arthralgias, back pain, gait problem, joint swelling and myalgias.  Skin: Negative for color change, pallor, rash and wound.  Neurological: Negative for dizziness, tremors, weakness and light-headedness.  Hematological: Negative for adenopathy. Does not bruise/bleed easily.  Psychiatric/Behavioral: Negative for agitation, behavioral problems, confusion, decreased concentration, dysphoric mood and sleep disturbance.       Objective:   Physical Exam  Constitutional: He is oriented to person, place, and time. He appears well-developed and well-nourished.  HENT:  Head: Normocephalic and atraumatic.  Eyes: Conjunctivae and EOM are normal.  Neck: Normal range of motion. Neck supple.  Cardiovascular: Normal rate, regular rhythm and normal heart sounds. Exam reveals no  gallop and no friction rub.  No murmur heard. Pulmonary/Chest: Effort normal and breath sounds normal. No respiratory distress. He has no wheezes. He has no rales. He exhibits no tenderness.  Abdominal: Soft. Bowel sounds are normal. He exhibits no distension. There is no abdominal tenderness.  Musculoskeletal: Normal range of motion.        General: No tenderness or edema.  Neurological: He is alert and oriented to person, place, and time.  Skin: Skin is warm and dry. No rash noted. No erythema. No pallor.  Psychiatric: He has a normal mood and affect. His speech is normal. Cognition and memory are impaired.          Assessment & Plan:  HIV:  Recheck labs and continue BIKTARVY.Return  to clinic in 6 months January have asked that the caregiver ensure that he gets a flu shot in the fall  Dementia: undoubtedly still with  cognitive deficits and I DON'T THINK HE IS COMPETENT TO CARE FOR HIMSELF.  Etohism:  Per his caregiver is only rarely sneaking off to drink  Seizure: He says he was seen in the ER for seizure in February though not clear that he had one does not sound like he is drinking enough alcohol to go through withdrawal and have a seizure from this.  He has had several insults to his central nervous system though.  Smoking: Someday he can get off of cigarettes  CAD with NSTEMI: Continue statin, aspirin  Neuropathic pains: monitor

## 2018-09-17 NOTE — Addendum Note (Signed)
Addended by: Dolan Amen D on: 09/17/2018 04:59 PM   Modules accepted: Orders

## 2018-09-18 ENCOUNTER — Telehealth: Payer: Self-pay

## 2018-09-18 LAB — T-HELPER CELL (CD4) - (RCID CLINIC ONLY)
CD4 % Helper T Cell: 28 % — ABNORMAL LOW (ref 33–65)
CD4 T Cell Abs: 432 /uL (ref 400–1790)

## 2018-09-18 NOTE — Telephone Encounter (Signed)
-----   Message from Truman Hayward, MD sent at 09/18/2018 10:46 AM EDT ----- Brian Marquez's kidney fxn a bit worse. Not going to be related to his ARV at all but he should followup with a PCP re this

## 2018-09-18 NOTE — Telephone Encounter (Signed)
Made Brian Marquez aware of recent lab results and advised to follow up with his PCP regarding his worsening kidney function.  Patient agrees. Eugenia Mcalpine, LPN

## 2018-09-18 NOTE — Telephone Encounter (Signed)
Thanks so much. 

## 2018-09-19 ENCOUNTER — Other Ambulatory Visit: Payer: Self-pay | Admitting: Infectious Disease

## 2018-09-19 DIAGNOSIS — L309 Dermatitis, unspecified: Secondary | ICD-10-CM

## 2018-10-02 ENCOUNTER — Other Ambulatory Visit: Payer: Self-pay | Admitting: Infectious Disease

## 2018-10-02 DIAGNOSIS — I214 Non-ST elevation (NSTEMI) myocardial infarction: Secondary | ICD-10-CM

## 2018-10-03 LAB — COMPLETE METABOLIC PANEL WITH GFR
AG Ratio: 1.8 (calc) (ref 1.0–2.5)
ALT: 34 U/L (ref 9–46)
AST: 26 U/L (ref 10–35)
Albumin: 4.2 g/dL (ref 3.6–5.1)
Alkaline phosphatase (APISO): 77 U/L (ref 35–144)
BUN/Creatinine Ratio: 9 (calc) (ref 6–22)
BUN: 14 mg/dL (ref 7–25)
CO2: 25 mmol/L (ref 20–32)
Calcium: 9.6 mg/dL (ref 8.6–10.3)
Chloride: 107 mmol/L (ref 98–110)
Creat: 1.64 mg/dL — ABNORMAL HIGH (ref 0.70–1.33)
GFR, Est African American: 53 mL/min/{1.73_m2} — ABNORMAL LOW (ref >=60)
GFR, Est Non African American: 45 mL/min/{1.73_m2} — ABNORMAL LOW (ref >=60)
Globulin: 2.3 g/dL (calc) (ref 1.9–3.7)
Glucose, Bld: 117 mg/dL — ABNORMAL HIGH (ref 65–99)
Potassium: 4.5 mmol/L (ref 3.5–5.3)
Sodium: 139 mmol/L (ref 135–146)
Total Bilirubin: 0.4 mg/dL (ref 0.2–1.2)
Total Protein: 6.5 g/dL (ref 6.1–8.1)

## 2018-10-03 LAB — LIPID PANEL
Cholesterol: 150 mg/dL (ref <200)
HDL: 35 mg/dL — ABNORMAL LOW (ref >=40)
LDL Cholesterol (Calc): 78 mg/dL (calc)
Non-HDL Cholesterol (Calc): 115 mg/dL (calc) (ref <130)
Total CHOL/HDL Ratio: 4.3 (calc) (ref <5.0)
Triglycerides: 279 mg/dL — ABNORMAL HIGH (ref <150)

## 2018-10-03 LAB — CBC WITH DIFFERENTIAL/PLATELET
Absolute Monocytes: 465 cells/uL (ref 200–950)
Basophils Absolute: 68 cells/uL (ref 0–200)
Basophils Relative: 1.1 %
Eosinophils Absolute: 229 cells/uL (ref 15–500)
Eosinophils Relative: 3.7 %
HCT: 41.5 % (ref 38.5–50.0)
Hemoglobin: 14.7 g/dL (ref 13.2–17.1)
Lymphs Abs: 1562 cells/uL (ref 850–3900)
MCH: 33.9 pg — ABNORMAL HIGH (ref 27.0–33.0)
MCHC: 35.4 g/dL (ref 32.0–36.0)
MCV: 95.6 fL (ref 80.0–100.0)
MPV: 9.2 fL (ref 7.5–12.5)
Monocytes Relative: 7.5 %
Neutro Abs: 3875 cells/uL (ref 1500–7800)
Neutrophils Relative %: 62.5 %
Platelets: 258 10*3/uL (ref 140–400)
RBC: 4.34 10*6/uL (ref 4.20–5.80)
RDW: 13.4 % (ref 11.0–15.0)
Total Lymphocyte: 25.2 %
WBC: 6.2 10*3/uL (ref 3.8–10.8)

## 2018-10-03 LAB — HIV RNA, RTPCR W/R GT (RTI, PI,INT)
HIV 1 RNA Quant: 27 copies/mL — ABNORMAL HIGH
HIV-1 RNA Quant, Log: 1.43 Log copies/mL — ABNORMAL HIGH

## 2018-10-03 LAB — RPR: RPR Ser Ql: NONREACTIVE

## 2018-10-29 ENCOUNTER — Other Ambulatory Visit: Payer: Self-pay | Admitting: Infectious Disease

## 2018-10-29 DIAGNOSIS — I214 Non-ST elevation (NSTEMI) myocardial infarction: Secondary | ICD-10-CM

## 2018-10-29 DIAGNOSIS — B20 Human immunodeficiency virus [HIV] disease: Secondary | ICD-10-CM

## 2018-11-28 ENCOUNTER — Other Ambulatory Visit: Payer: Self-pay | Admitting: Infectious Disease

## 2018-11-28 DIAGNOSIS — I214 Non-ST elevation (NSTEMI) myocardial infarction: Secondary | ICD-10-CM

## 2019-01-16 ENCOUNTER — Other Ambulatory Visit: Payer: Self-pay | Admitting: Infectious Disease

## 2019-01-16 DIAGNOSIS — L309 Dermatitis, unspecified: Secondary | ICD-10-CM

## 2019-02-14 ENCOUNTER — Other Ambulatory Visit: Payer: Self-pay | Admitting: Infectious Disease

## 2019-02-14 DIAGNOSIS — I214 Non-ST elevation (NSTEMI) myocardial infarction: Secondary | ICD-10-CM

## 2019-02-14 DIAGNOSIS — B20 Human immunodeficiency virus [HIV] disease: Secondary | ICD-10-CM

## 2019-03-20 ENCOUNTER — Ambulatory Visit (INDEPENDENT_AMBULATORY_CARE_PROVIDER_SITE_OTHER): Payer: Medicare Other | Admitting: Infectious Disease

## 2019-03-20 ENCOUNTER — Other Ambulatory Visit: Payer: Self-pay

## 2019-03-20 ENCOUNTER — Encounter: Payer: Self-pay | Admitting: Infectious Disease

## 2019-03-20 ENCOUNTER — Other Ambulatory Visit (HOSPITAL_COMMUNITY)
Admission: RE | Admit: 2019-03-20 | Discharge: 2019-03-20 | Disposition: A | Discharge status: Home / Self Care | Payer: Medicare Other | Source: Ambulatory Visit | Attending: Infectious Disease | Admitting: Infectious Disease

## 2019-03-20 VITALS — BP 131/87 | HR 86 | Temp 97.9°F | Ht 72.0 in | Wt 158.0 lb

## 2019-03-20 DIAGNOSIS — G909 Disorder of the autonomic nervous system, unspecified: Secondary | ICD-10-CM | POA: Diagnosis not present

## 2019-03-20 DIAGNOSIS — F17200 Nicotine dependence, unspecified, uncomplicated: Secondary | ICD-10-CM | POA: Diagnosis present

## 2019-03-20 DIAGNOSIS — A812 Progressive multifocal leukoencephalopathy: Secondary | ICD-10-CM

## 2019-03-20 DIAGNOSIS — I214 Non-ST elevation (NSTEMI) myocardial infarction: Secondary | ICD-10-CM | POA: Diagnosis present

## 2019-03-20 DIAGNOSIS — B20 Human immunodeficiency virus [HIV] disease: Secondary | ICD-10-CM

## 2019-03-20 DIAGNOSIS — Z23 Encounter for immunization: Secondary | ICD-10-CM | POA: Diagnosis not present

## 2019-03-20 DIAGNOSIS — F1020 Alcohol dependence, uncomplicated: Secondary | ICD-10-CM | POA: Diagnosis not present

## 2019-03-20 DIAGNOSIS — N289 Disorder of kidney and ureter, unspecified: Secondary | ICD-10-CM | POA: Diagnosis not present

## 2019-03-20 MED ORDER — BIKTARVY 50-200-25 MG PO TABS: 1.0000 | ORAL_TABLET | Freq: Every day | ORAL | 11 refills | Status: DC

## 2019-03-20 MED ORDER — ATORVASTATIN CALCIUM 40 MG PO TABS: 40.0000 mg | ORAL_TABLET | Freq: Every evening | ORAL | 11 refills | Status: DC

## 2019-03-20 NOTE — Addendum Note (Signed)
Addended by: Shelly Bombard on: 03/20/2019 11:30 AM   Modules accepted: Orders

## 2019-03-20 NOTE — Progress Notes (Signed)
Chief complaint: followup for HIV disease   Subjective:    Patient ID: Brian Marquez, male    DOB: Apr 03, 1960, 59 y.o.   MRN: [id number]  HIV Positive/AIDS   59 year old Caucasian man with HIV/AIDS, prior polysubstance abuse in the past HIV encephalopathy still living in ALF facility. Phone number is  [phone]. He has been compliant with  to [facility].   Ahmeer does have comorbid coronary artery disease, HIV dementia smoking.  He has been suppressed we check labs in July but did have acute kidney injury with a creatinine that went up to 1.6.  He is being followed by primary care as well but apparently not having many labs.  He used to smoke a half a pack to a pack of cigarettes a day.  He has not drunk much alcohol recently.  He claims to be how adherent to his antiretroviral.  He has had his Covid vaccine #1 from [employer] he is also had flu shot.      Past Medical History:  Diagnosis Date  . Alcohol abuse 03/31/2015  . Alcoholism ([facility])   . Alcoholism ([facility]) 08/18/2014  . Coma ([facility])   . 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 Encephalopathy   . 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 HIV infection ([facility])   . 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  No family history on file.    Social History   Socioeconomic History  . Marital status: Single    Spouse name: Not on file  . Number of children: Not on file  . Years of education: Not on file  . Highest education level: Not on file  Occupational History  . Not on file  Tobacco Use  . Smoking status: Current Every Day Smoker    Packs/day: 0.50    Types: Cigarettes  . Smokeless tobacco: Never Used  . Tobacco comment: cutting back  Substance and Sexual Activity  . Alcohol use: Yes    Alcohol/week: 0.0 standard drinks    Comment: Occassional, 40oz when he does drink  . Drug use: No  . Sexual activity: Never    Partners: Female    Comment: declined condoms  Other Topics Concern  .  Not on file  Social History Narrative  . Not on file   Social Determinants of Health   Financial Resource Strain:   . Difficulty of Paying Living Expenses: Not on file  Food Insecurity:   . Worried About Programme researcher, broadcasting/film/video in the Last Year: Not on file  . Ran Out of Food in the Last Year: Not on file  Transportation Needs:   . Lack of Transportation (Medical): Not on file  . Lack of Transportation (Non-Medical): Not on file  Physical Activity:   . Days of Exercise per Week: Not on file  . Minutes of Exercise per Session: Not on file  Stress:   . Feeling of Stress : Not on file  Social Connections:   . Frequency of Communication with Friends and Family: Not on file  . Frequency of Social Gatherings with Friends and Family: Not on file  . Attends Religious Services: Not on file  . Active Member of Clubs or Organizations: Not on file  . Attends Banker Meetings: Not on file  . Marital Status: Not on file    No Known Allergies   Current Outpatient Medications:  .  atorvastatin (LIPITOR) 40 MG tablet, TAKE 1 TABLET BY MOUTH  ONCE DAILY IN THE EVENING, Disp: 30 tablet, Rfl: 5 .  BIKTARVY 50-200-25 MG TABS tablet, TAKE 1 TABLET BY MOUTH ONCE DAILY., Disp: 30 tablet, Rfl: 4 .  disulfiram (ANTABUSE) 250 MG tablet, Take 500 mg by mouth daily., Disp: , Rfl:  .  fexofenadine (ALLEGRA) 180 MG tablet, TAKE 1 TABLET BY MOUTH ONCE DAILY., Disp: 30 tablet, Rfl: 4 .  Fluticasone-Salmeterol (ADVAIR) 250-50 MCG/DOSE AEPB, Inhale 1 puff into the lungs every 12 (twelve) hours., Disp: , Rfl:  .  gabapentin (NEURONTIN) 300 MG capsule, TAKE 1 CAPSULE BY MOUTH AT BEDTIME, Disp: 30 capsule, Rfl: 5 .  GNP ASPIRIN LOW DOSE 81 MG EC tablet, TAKE 1 TABLET BY MOUTH ONCE DAILY., Disp: 30 tablet, Rfl: 4 .  mirtazapine (REMERON) 7.5 MG tablet, TAKE ONE TABLET BY MOUTH AT BEDTIME., Disp: 30 tablet, Rfl: 5 .  nitroGLYCERIN (NITROSTAT) 0.4 MG SL tablet, Place 1 tablet (0.4 mg total) under the tongue  every 5 (five) minutes x 3 doses as needed for chest pain., Disp: 30 tablet, Rfl: 0 .  triamcinolone cream (KENALOG) 0.5 %, APPLY AS DIRECTED TO AFFECTED AREA TWICE DAILY AS NEEDED, Disp: 30 g, Rfl: 0    Review of Systems  Unable to perform ROS: Dementia  Constitutional: Negative for activity change, appetite change, chills, diaphoresis, fatigue, fever and unexpected weight change.  HENT: Negative for congestion, rhinorrhea, sinus pressure, sneezing, sore throat and trouble swallowing.   Eyes: Negative for photophobia and visual disturbance.  Respiratory: Negative for cough, chest tightness, shortness of breath, wheezing and stridor.   Cardiovascular: Negative for chest pain, palpitations and leg swelling.  Gastrointestinal: Negative for abdominal distention, abdominal pain, anal bleeding, blood in stool, constipation, diarrhea, nausea and vomiting.  Genitourinary: Negative for difficulty urinating, dysuria, flank pain and hematuria.  Musculoskeletal: Negative for arthralgias, back pain, gait problem, joint swelling and myalgias.  Skin: Negative for color change, pallor, rash and wound.  Neurological: Negative for dizziness, tremors, weakness and light-headedness.  Hematological: Negative for adenopathy. Does not bruise/bleed easily.  Psychiatric/Behavioral: Negative for agitation, behavioral problems, confusion, decreased concentration, dysphoric mood and sleep disturbance.       Objective:   Physical Exam  Constitutional: He is oriented to person, place, and time. He appears well-developed and well-nourished.  HENT:  Head: Normocephalic and atraumatic.  Eyes: Conjunctivae and EOM are normal.  Cardiovascular: Normal rate, regular rhythm and normal heart sounds. Exam reveals no gallop and no friction rub.  No murmur heard. Pulmonary/Chest: Effort normal and breath sounds normal. No respiratory distress. He has no wheezes. He has no rales. He exhibits no tenderness.  Abdominal: Soft.  Bowel sounds are normal. He exhibits no distension. There is no abdominal tenderness.  Musculoskeletal:        General: No tenderness or edema. Normal range of motion.     Cervical back: Normal range of motion and neck supple.  Neurological: He is alert and oriented to person, place, and time.  Skin: Skin is warm and dry. No rash noted. No erythema. No pallor.  Psychiatric: He has a normal mood and affect. His speech is normal. Cognition and memory are impaired.          Assessment & Plan:  HIV:  Recheck labs and continue BIKTARVY.Return to clinic in 6 months J  Dementia: undoubtedly still with  cognitive deficits and I DON'T THINK HE IS COMPETENT TO CARE FOR HIMSELF.  Etohism:  Per his caregiver he is not drinking much now partly thinks to the  cold weather  Seizure: No recent seizures  Smoking: Someday he can get off of cigarettes currently smoking half pack to pack of cigarettes  CAD with NSTEMI: Continue statin, aspirin,  Covid prevention he should get a second dose of Covid vaccine and it would be nice if it stop smoking but the latter is likely not to happen  Acute kidney injury over the summer: We will recheck metabolic panel today.  Need for tetanus shot will give him a tetanus vaccine today.  Healthcare maintenance he also needs a colonoscopy at some point time since he has not had one in the last 10 years.

## 2019-03-21 LAB — T-HELPER CELL (CD4) - (RCID CLINIC ONLY)
CD4 % Helper T Cell: 33 % (ref 33–65)
CD4 T Cell Abs: 488 /uL (ref 400–1790)

## 2019-03-21 LAB — URINE CYTOLOGY ANCILLARY ONLY
Chlamydia: NEGATIVE
Comment: NEGATIVE
Comment: NORMAL
Neisseria Gonorrhea: NEGATIVE

## 2019-03-23 LAB — COMPLETE METABOLIC PANEL WITH GFR
AG Ratio: 1.8 (calc) (ref 1.0–2.5)
ALT: 24 U/L (ref 9–46)
AST: 19 U/L (ref 10–35)
Albumin: 4.4 g/dL (ref 3.6–5.1)
Alkaline phosphatase (APISO): 87 U/L (ref 35–144)
BUN/Creatinine Ratio: 9 (calc) (ref 6–22)
BUN: 12 mg/dL (ref 7–25)
CO2: 28 mmol/L (ref 20–32)
Calcium: 9.7 mg/dL (ref 8.6–10.3)
Chloride: 104 mmol/L (ref 98–110)
Creat: 1.4 mg/dL — ABNORMAL HIGH (ref 0.70–1.33)
GFR, Est African American: 63 mL/min/{1.73_m2} (ref >=60)
GFR, Est Non African American: 55 mL/min/{1.73_m2} — ABNORMAL LOW (ref >=60)
Globulin: 2.5 g/dL (calc) (ref 1.9–3.7)
Glucose, Bld: 94 mg/dL (ref 65–99)
Potassium: 3.9 mmol/L (ref 3.5–5.3)
Sodium: 139 mmol/L (ref 135–146)
Total Bilirubin: 0.4 mg/dL (ref 0.2–1.2)
Total Protein: 6.9 g/dL (ref 6.1–8.1)

## 2019-03-23 LAB — CBC WITH DIFFERENTIAL/PLATELET
Absolute Monocytes: 491 cells/uL (ref 200–950)
Basophils Absolute: 70 cells/uL (ref 0–200)
Basophils Relative: 0.9 %
Eosinophils Absolute: 133 cells/uL (ref 15–500)
Eosinophils Relative: 1.7 %
HCT: 46.2 % (ref 38.5–50.0)
Hemoglobin: 16 g/dL (ref 13.2–17.1)
Lymphs Abs: 1607 cells/uL (ref 850–3900)
MCH: 32.3 pg (ref 27.0–33.0)
MCHC: 34.6 g/dL (ref 32.0–36.0)
MCV: 93.3 fL (ref 80.0–100.0)
MPV: 9.1 fL (ref 7.5–12.5)
Monocytes Relative: 6.3 %
Neutro Abs: 5499 cells/uL (ref 1500–7800)
Neutrophils Relative %: 70.5 %
Platelets: 279 10*3/uL (ref 140–400)
RBC: 4.95 10*6/uL (ref 4.20–5.80)
RDW: 12.5 % (ref 11.0–15.0)
Total Lymphocyte: 20.6 %
WBC: 7.8 10*3/uL (ref 3.8–10.8)

## 2019-03-23 LAB — LIPID PANEL
Cholesterol: 136 mg/dL (ref <200)
HDL: 34 mg/dL — ABNORMAL LOW (ref >=40)
LDL Cholesterol (Calc): 66 mg/dL (calc)
Non-HDL Cholesterol (Calc): 102 mg/dL (calc) (ref <130)
Total CHOL/HDL Ratio: 4 (calc) (ref <5.0)
Triglycerides: 299 mg/dL — ABNORMAL HIGH (ref <150)

## 2019-03-23 LAB — HIV-1 RNA QUANT-NO REFLEX-BLD
HIV 1 RNA Quant: 30 copies/mL — ABNORMAL HIGH
HIV-1 RNA Quant, Log: 1.48 Log copies/mL — ABNORMAL HIGH

## 2019-03-23 LAB — RPR: RPR Ser Ql: NONREACTIVE

## 2019-04-16 ENCOUNTER — Other Ambulatory Visit: Payer: Self-pay

## 2019-04-16 MED ORDER — FEXOFENADINE HCL 180 MG PO TABS: 180.0000 mg | ORAL_TABLET | Freq: Every day | ORAL | 4 refills | Status: DC

## 2019-07-06 ENCOUNTER — Other Ambulatory Visit: Payer: Self-pay | Admitting: Infectious Disease

## 2019-07-06 DIAGNOSIS — I214 Non-ST elevation (NSTEMI) myocardial infarction: Secondary | ICD-10-CM

## 2019-08-12 ENCOUNTER — Other Ambulatory Visit: Payer: Self-pay | Admitting: Infectious Disease

## 2019-08-12 DIAGNOSIS — L309 Dermatitis, unspecified: Secondary | ICD-10-CM

## 2019-09-09 ENCOUNTER — Other Ambulatory Visit: Payer: Self-pay

## 2019-09-09 MED ORDER — FEXOFENADINE HCL 180 MG PO TABS: 180.0000 mg | ORAL_TABLET | Freq: Every day | ORAL | 4 refills | Status: DC

## 2019-09-15 IMAGING — CT CT HEAD W/O CM
3 series · 16 of 47 positions shown, 19 images · non-contrast
Comparison: 06/15/2017.

CLINICAL DATA: Possible seizure resulting in a fall off the bed.

EXAM:
CT HEAD WITHOUT CONTRAST
TECHNIQUE: Contiguous axial images were obtained from the base of the skull
through the vertex without intravenous contrast.

[Series 3: head wo · axial · 0.41mm/px · z∈[+156,+281]mm · 10 of 30 slices shown, 13 images]
[im 3/30  brain]
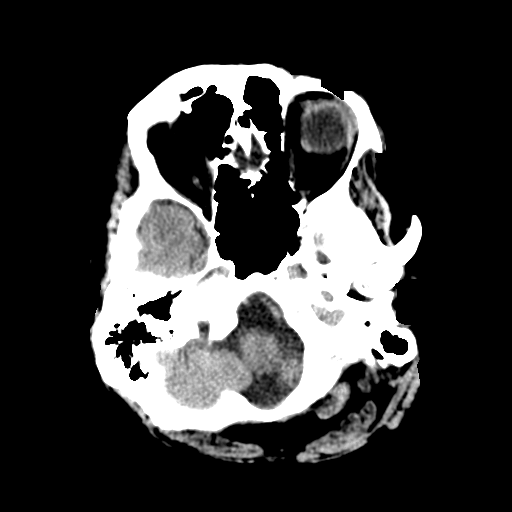
[im 3/30  bone]
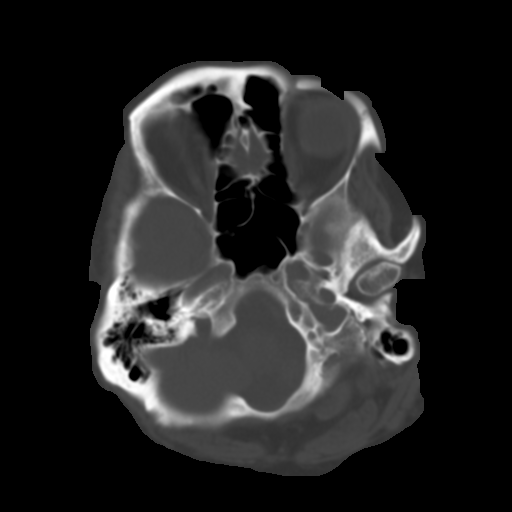
[im 6/30  brain]
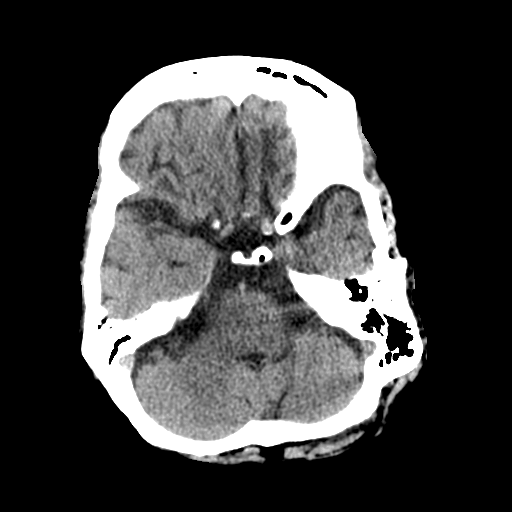
[im 9/30  brain]
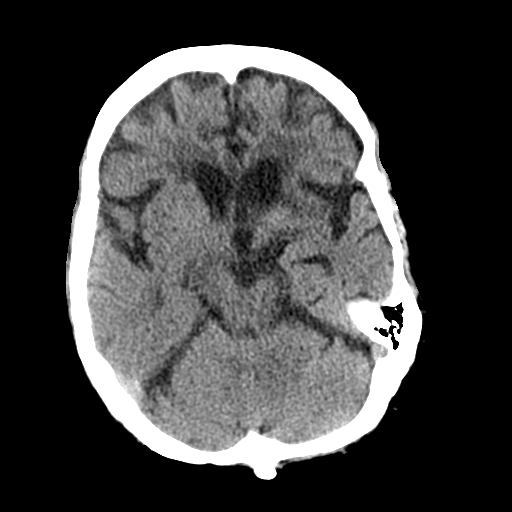
[im 11/30  brain]
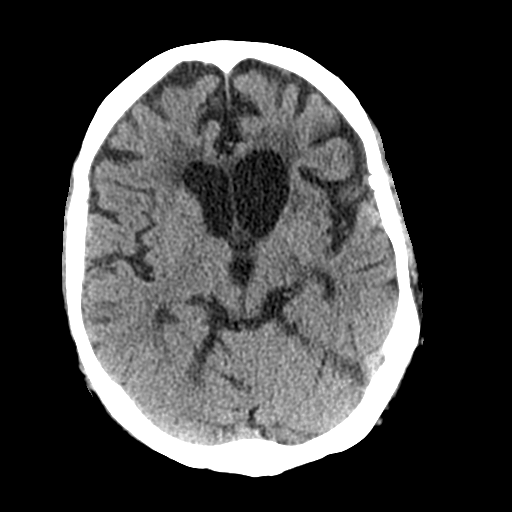
[im 14/30  brain]
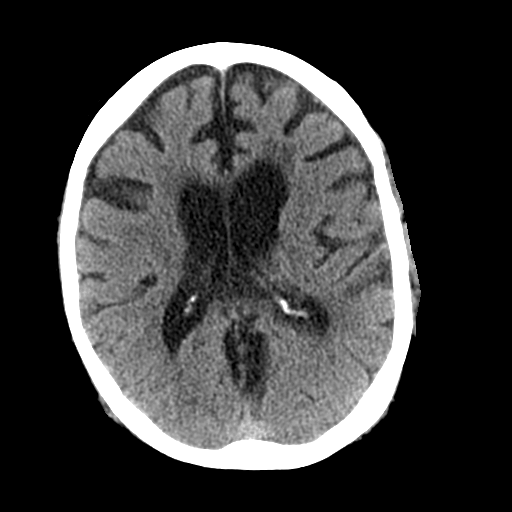
[im 14/30  bone]
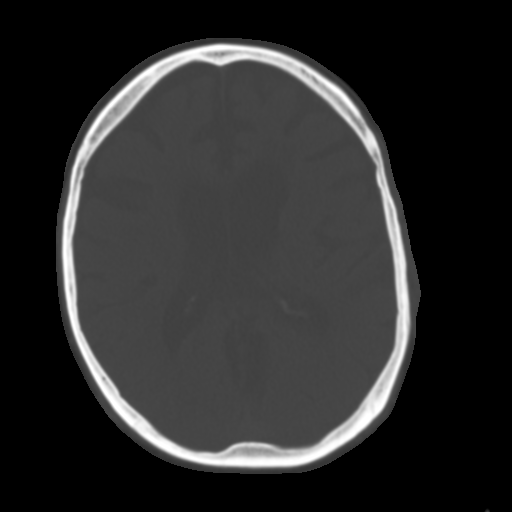
[im 17/30  brain]
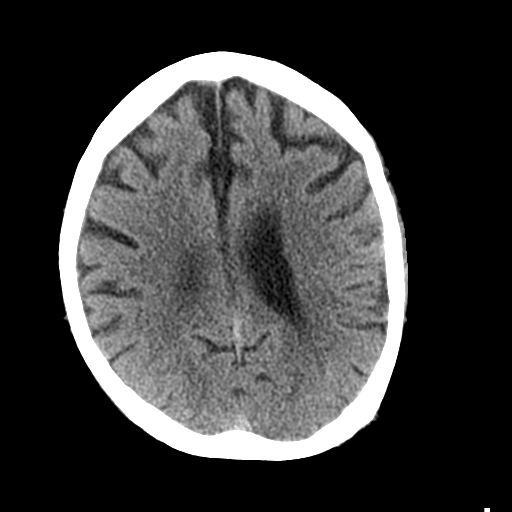
[im 20/30  brain]
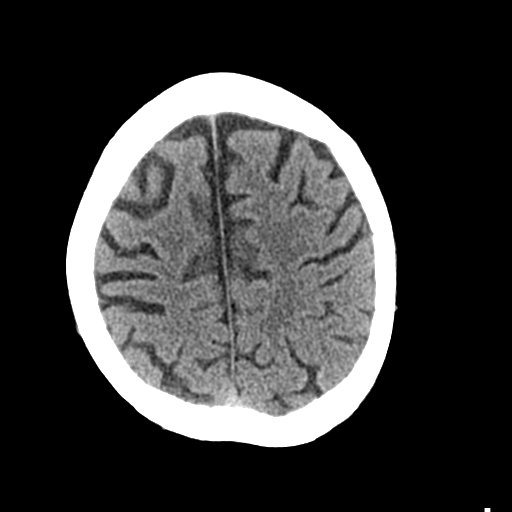
[im 23/30  brain]
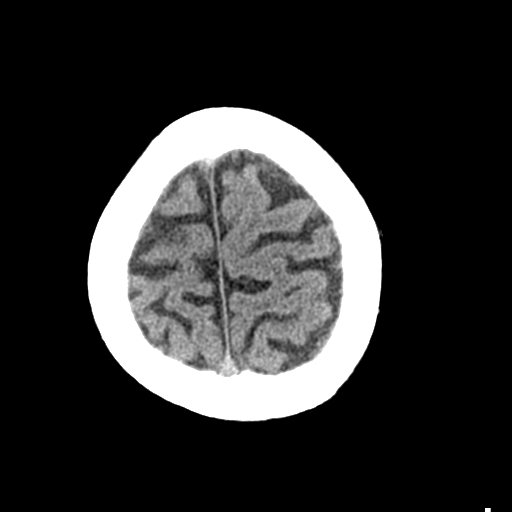
[im 25/30  brain]
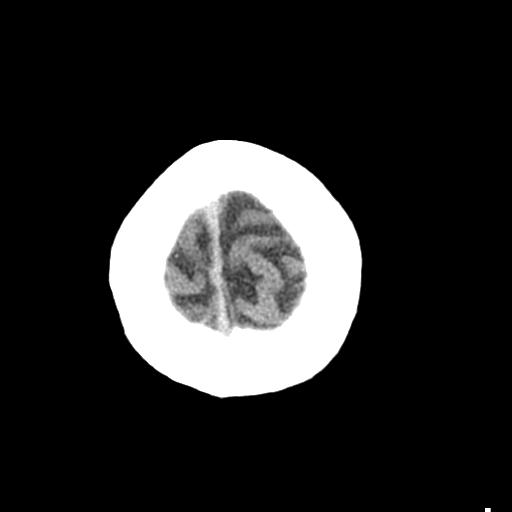
[im 25/30  bone]
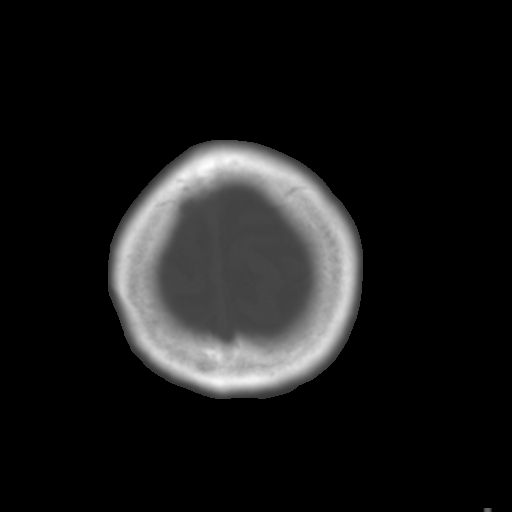
[im 28/30  brain]
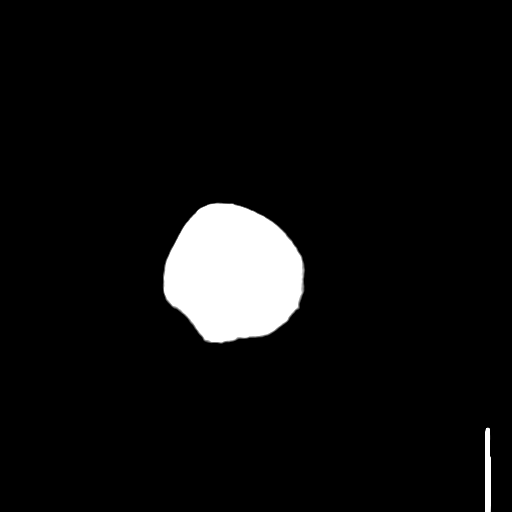

[Series 4: coronal soft tissue · coronal · 0.29mm/px · 3 of 61 slices shown]
[im 21/61  brain]
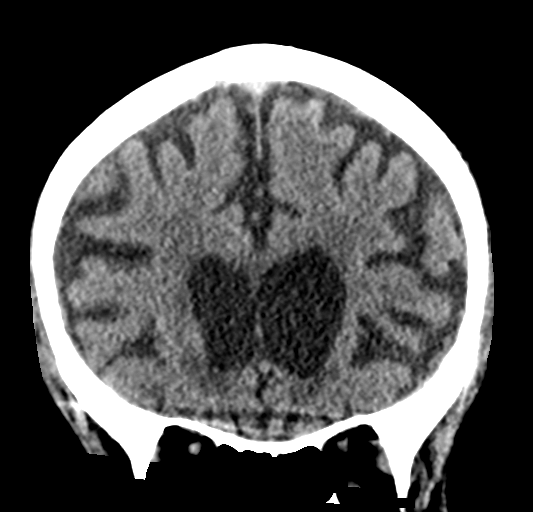
[im 27/61  brain]
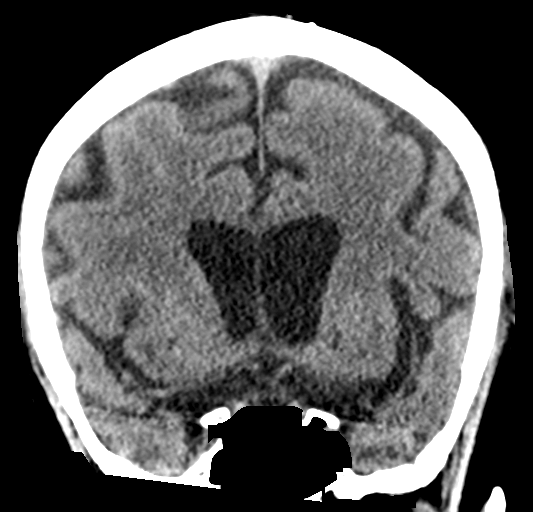
[im 34/61  brain]
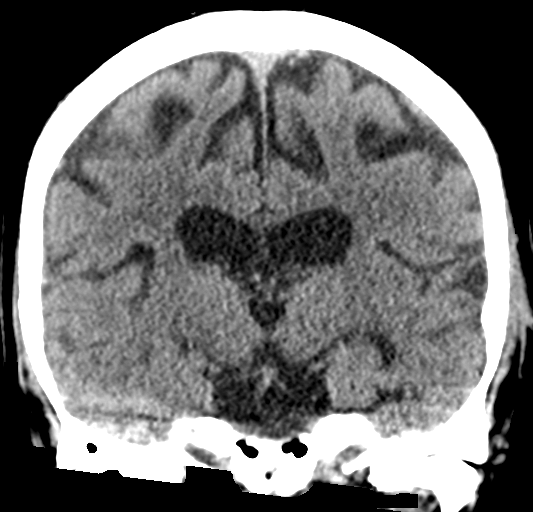

[Series 5: sagittal soft tissue · sagittal · 0.29mm/px · 3 of 52 slices shown]
[im 18/52  brain]
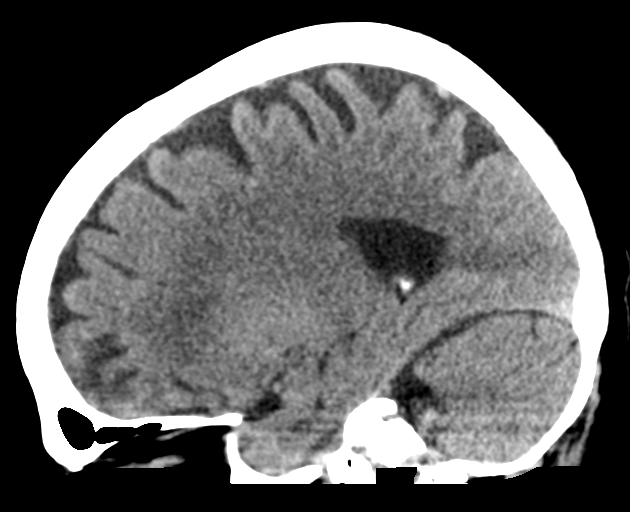
[im 26/52  brain]
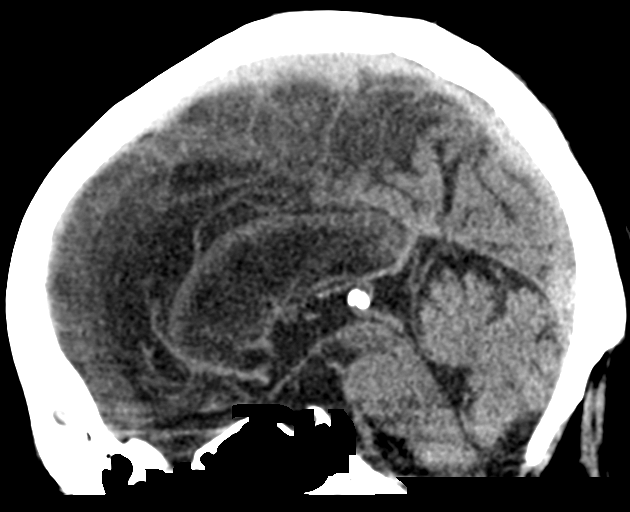
[im 35/52  brain]
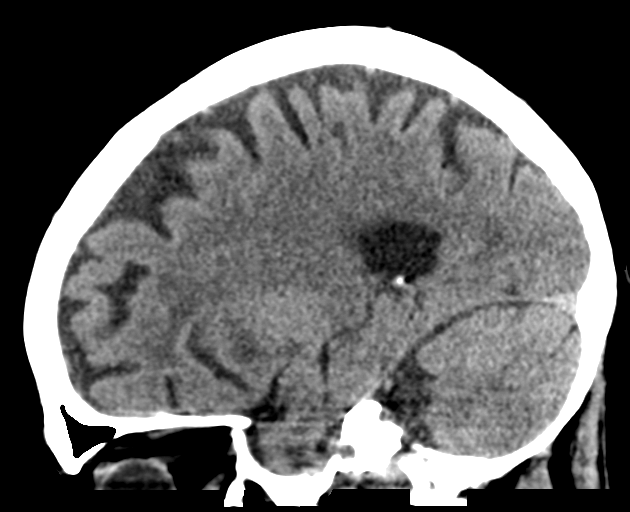

[16 of 47 positions shown; findings below may reference images not displayed]

FINDINGS: Brain: Moderately enlarged ventricles and subarachnoid spaces.
Moderate patchy white matter low density in both cerebral
hemispheres. Old left basal ganglia infarct. Old left frontal lobe
infarct. No intracranial hemorrhage, mass lesion or CT evidence of
acute infarction.

Vascular: No hyperdense vessel or unexpected calcification.

Skull: Normal. Negative for fracture or focal lesion.

Sinuses/Orbits: Opacified mid frontal air cell with mild mucosal
thickening in the adjacent right frontal air cell. The included
portions of the orbits are unremarkable.

Other: Left posterolateral scalp scar.
IMPRESSION: 1. No acute abnormality.
2. Moderate diffuse cerebral and cerebellar atrophy.
3. Moderate chronic small vessel white matter ischemic changes in
both cerebral hemispheres.
4. Old left basal ganglia and left frontal lobe infarcts.

## 2019-10-17 ENCOUNTER — Encounter: Payer: Self-pay | Admitting: Infectious Disease

## 2019-10-17 ENCOUNTER — Ambulatory Visit (INDEPENDENT_AMBULATORY_CARE_PROVIDER_SITE_OTHER): Payer: Medicare Other | Admitting: Infectious Disease

## 2019-10-17 ENCOUNTER — Other Ambulatory Visit: Payer: Self-pay

## 2019-10-17 DIAGNOSIS — F17200 Nicotine dependence, unspecified, uncomplicated: Secondary | ICD-10-CM

## 2019-10-17 DIAGNOSIS — A539 Syphilis, unspecified: Secondary | ICD-10-CM

## 2019-10-17 DIAGNOSIS — F1020 Alcohol dependence, uncomplicated: Secondary | ICD-10-CM | POA: Diagnosis not present

## 2019-10-17 DIAGNOSIS — B20 Human immunodeficiency virus [HIV] disease: Secondary | ICD-10-CM | POA: Diagnosis present

## 2019-10-17 DIAGNOSIS — F0280 Dementia in other diseases classified elsewhere without behavioral disturbance: Secondary | ICD-10-CM

## 2019-10-17 DIAGNOSIS — L0231 Cutaneous abscess of buttock: Secondary | ICD-10-CM | POA: Diagnosis not present

## 2019-10-17 DIAGNOSIS — A812 Progressive multifocal leukoencephalopathy: Secondary | ICD-10-CM

## 2019-10-17 DIAGNOSIS — G909 Disorder of the autonomic nervous system, unspecified: Secondary | ICD-10-CM | POA: Diagnosis not present

## 2019-10-17 DIAGNOSIS — I214 Non-ST elevation (NSTEMI) myocardial infarction: Secondary | ICD-10-CM | POA: Diagnosis not present

## 2019-10-17 HISTORY — DX: Cutaneous abscess of buttock: L02.31

## 2019-10-17 NOTE — Progress Notes (Signed)
Chief complaint: followup for HIV disease , he now has an abscess on buttocks  Subjective:    Patient ID: Brian Marquez, male    DOB: Aug 11, 1960, 59 y.o.   MRN: [id number]  HIV Positive/AIDS   59 year old Caucasian man with HIV/AIDS, prior polysubstance abuse in the past HIV encephalopathy still living in ALF facility. Phone number is  [phone]. He has been compliant with  to [facility].   Brian Marquez does have comorbid coronary artery disease, HIV dementia smoking.  He has had 2 COVID 19 vaccines. He had RPR + to 1:4 and we gave him bicillin x 1.  He more recently developed what sounds like an MRSA abscess on buttocks that he claims he cut open himself. He was seen and given doxycycline which he is still taking.       Past Medical History:  Diagnosis Date  . Alcohol abuse 03/31/2015  . Alcoholism ([facility])   . Alcoholism ([facility]) 08/18/2014  . Coma ([facility])   . 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 Encephalopathy   . 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 HIV infection ([facility])   . 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  No family history on file.    Social History   Socioeconomic History  . Marital status: Single    Spouse name: Not on file  . Number of children: Not on file  . Years of education: Not on file  . Highest education level: Not on file  Occupational History  . Not on file  Tobacco Use  . Smoking status: Current Every Day Smoker    Packs/day: 0.50    Types: Cigarettes  . Smokeless tobacco: Never Used  . Tobacco comment: cutting back  Substance and Sexual Activity  . Alcohol use: Yes    Alcohol/week: 0.0 standard drinks    Comment: Occassional, 40oz when he does drink  . Drug use: No  . Sexual activity: Never    Partners: Female    Comment: declined condoms  Other Topics Concern  . Not on file  Social History Narrative  . Not on file   Social Determinants of Health   Financial Resource Strain:   . Difficulty of Paying  Living Expenses: Not on file  Food Insecurity:   . Worried About Programme researcher, broadcasting/film/video in the Last Year: Not on file  . Ran Out of Food in the Last Year: Not on file  Transportation Needs:   . Lack of Transportation (Medical): Not on file  . Lack of Transportation (Non-Medical): Not on file  Physical Activity:   . Days of Exercise per Week: Not on file  . Minutes of Exercise per Session: Not on file  Stress:   . Feeling of Stress : Not on file  Social Connections:   . Frequency of Communication with Friends and Family: Not on file  . Frequency of Social Gatherings with Friends and Family: Not on file  . Attends Religious Services: Not on file  . Active Member of Clubs or Organizations: Not on file  . Attends Banker Meetings: Not on file  . Marital Status: Not on file    No Known Allergies   Current Outpatient Medications:  .  atorvastatin (LIPITOR) 40 MG tablet, Take 1 tablet (40 mg total) by mouth every evening., Disp: 30 tablet, Rfl: 11 .  bictegravir-emtricitabine-tenofovir AF (BIKTARVY) 50-200-25 MG TABS tablet, Take 1 tablet by mouth daily., Disp: 30 tablet, Rfl: 11 .  disulfiram (ANTABUSE) 250 MG tablet, Take 500 mg by mouth daily., Disp: , Rfl:  .  fexofenadine (ALLEGRA) 180 MG tablet, Take 1 tablet (180 mg total) by mouth daily., Disp: 30 tablet, Rfl: 4 .  Fluticasone-Salmeterol (ADVAIR) 250-50 MCG/DOSE AEPB, Inhale 1 puff into the lungs every 12 (twelve) hours., Disp: , Rfl:  .  gabapentin (NEURONTIN) 300 MG capsule, TAKE 1 CAPSULE BY MOUTH AT BEDTIME (Patient not taking: Reported on 03/20/2019), Disp: 30 capsule, Rfl: 5 .  GNP ASPIRIN LOW DOSE 81 MG EC tablet, TAKE 1 TABLET BY MOUTH ONCE DAILY., Disp: 30 tablet, Rfl: 4 .  mirtazapine (REMERON) 7.5 MG tablet, TAKE ONE TABLET BY MOUTH AT BEDTIME. (Patient not taking: Reported on 03/20/2019), Disp: 30 tablet, Rfl: 5 .  nitroGLYCERIN (NITROSTAT) 0.4 MG SL tablet, Place 1 tablet (0.4 mg total) under the tongue every 5  (five) minutes x 3 doses as needed for chest pain. (Patient not taking: Reported on 03/20/2019), Disp: 30 tablet, Rfl: 0 .  triamcinolone cream (KENALOG) 0.5 %, APPLY AS DIRECTED TO AFFECTED AREA TWICE DAILY AS NEEDED *MAY SELF APPLY AT BEDTIME*, Disp: 30 g, Rfl: 0    Review of Systems  Unable to perform ROS: Dementia  Constitutional: Negative for activity change, appetite change, chills, diaphoresis, fatigue, fever and unexpected weight change.  HENT: Negative for congestion, rhinorrhea, sinus pressure, sneezing, sore throat and trouble swallowing.   Eyes: Negative for photophobia and visual disturbance.  Respiratory: Negative for cough, chest tightness, shortness of breath, wheezing and stridor.   Cardiovascular: Negative for chest pain, palpitations and leg swelling.  Gastrointestinal: Negative for abdominal distention, abdominal pain, anal bleeding, blood in stool, constipation, diarrhea, nausea and vomiting.  Genitourinary: Negative for difficulty urinating, dysuria, flank pain and hematuria.  Musculoskeletal: Negative for arthralgias, back pain, gait problem, joint swelling and myalgias.  Skin: Positive for wound. Negative for color change, pallor and rash.  Neurological: Negative for dizziness, tremors, weakness and light-headedness.  Hematological: Negative for adenopathy. Does not bruise/bleed easily.  Psychiatric/Behavioral: Negative for agitation, behavioral problems, confusion, decreased concentration, dysphoric mood and sleep disturbance.       Objective:   Physical Exam Constitutional:      Appearance: He is well-developed.  HENT:     Head: Normocephalic and atraumatic.  Eyes:     Conjunctiva/sclera: Conjunctivae normal.  Cardiovascular:     Rate and Rhythm: Normal rate and regular rhythm.     Heart sounds: Normal heart sounds. No murmur heard.  No friction rub. No gallop.   Pulmonary:     Effort: Pulmonary effort is normal. No respiratory distress.     Breath sounds:  Normal breath sounds. No wheezing or rales.  Chest:     Chest wall: No tenderness.  Abdominal:     General: Bowel sounds are normal. There is no distension.     Palpations: Abdomen is soft.     Tenderness: There is no abdominal tenderness.  Musculoskeletal:        General: No tenderness. Normal range of motion.     Cervical back: Normal range of motion and neck supple.  Skin:    General: Skin is warm and dry.     Coloration: Skin is not pale.     Findings: No erythema or rash.  Neurological:     General: No focal deficit present.     Mental Status: He is alert and oriented to person, place, and time.  Psychiatric:        Speech: Speech normal.  Cognition and Memory: Memory is impaired.     Buttocks wound 10/17/2019:          Assessment & Plan:  HIV:  Recheck labs and continue BIKTARVY.Return to clinic in 6 months J  Dementia: undoubtedly still with  cognitive deficits and I DON'T THINK HE IS COMPETENT TO CARE FOR HIMSELF. Seizure: No recent seizures  Smoking: Someday he can get off of cigarettes currently smoking half pack to pack of cigarettes  CAD with NSTEMI: Continue statin, aspirin,  Covid prevention : had 2 doses of Pfizer  Buttocks abscess: seems to have been successfully drained. I agree with doxycycline.

## 2019-10-18 LAB — T-HELPER CELL (CD4) - (RCID CLINIC ONLY)
CD4 % Helper T Cell: 29 % — ABNORMAL LOW (ref 33–65)
CD4 T Cell Abs: 518 /uL (ref 400–1790)

## 2019-10-23 LAB — COMPLETE METABOLIC PANEL WITH GFR
AG Ratio: 1.6 (calc) (ref 1.0–2.5)
ALT: 11 U/L (ref 9–46)
AST: 16 U/L (ref 10–35)
Albumin: 4.3 g/dL (ref 3.6–5.1)
Alkaline phosphatase (APISO): 91 U/L (ref 35–144)
BUN/Creatinine Ratio: 7 (calc) (ref 6–22)
BUN: 12 mg/dL (ref 7–25)
CO2: 22 mmol/L (ref 20–32)
Calcium: 10.1 mg/dL (ref 8.6–10.3)
Chloride: 106 mmol/L (ref 98–110)
Creat: 1.78 mg/dL — ABNORMAL HIGH (ref 0.70–1.33)
GFR, Est African American: 47 mL/min/{1.73_m2} — ABNORMAL LOW (ref >=60)
GFR, Est Non African American: 41 mL/min/{1.73_m2} — ABNORMAL LOW (ref >=60)
Globulin: 2.7 g/dL (calc) (ref 1.9–3.7)
Glucose, Bld: 119 mg/dL — ABNORMAL HIGH (ref 65–99)
Potassium: 4.1 mmol/L (ref 3.5–5.3)
Sodium: 140 mmol/L (ref 135–146)
Total Bilirubin: 0.4 mg/dL (ref 0.2–1.2)
Total Protein: 7 g/dL (ref 6.1–8.1)

## 2019-10-23 LAB — HIV-1 RNA QUANT-NO REFLEX-BLD
HIV 1 RNA Quant: 22 Copies/mL — ABNORMAL HIGH
HIV-1 RNA Quant, Log: 1.34 Log cps/mL — ABNORMAL HIGH

## 2019-10-23 LAB — CBC WITH DIFFERENTIAL/PLATELET
Absolute Monocytes: 607 cells/uL (ref 200–950)
Basophils Absolute: 57 cells/uL (ref 0–200)
Basophils Relative: 0.7 %
Eosinophils Absolute: 156 cells/uL (ref 15–500)
Eosinophils Relative: 1.9 %
HCT: 42.4 % (ref 38.5–50.0)
Hemoglobin: 14.4 g/dL (ref 13.2–17.1)
Lymphs Abs: 1788 cells/uL (ref 850–3900)
MCH: 33.6 pg — ABNORMAL HIGH (ref 27.0–33.0)
MCHC: 34 g/dL (ref 32.0–36.0)
MCV: 99.1 fL (ref 80.0–100.0)
MPV: 8.9 fL (ref 7.5–12.5)
Monocytes Relative: 7.4 %
Neutro Abs: 5592 cells/uL (ref 1500–7800)
Neutrophils Relative %: 68.2 %
Platelets: 408 10*3/uL — ABNORMAL HIGH (ref 140–400)
RBC: 4.28 10*6/uL (ref 4.20–5.80)
RDW: 13.9 % (ref 11.0–15.0)
Total Lymphocyte: 21.8 %
WBC: 8.2 10*3/uL (ref 3.8–10.8)

## 2019-10-23 LAB — RPR: RPR Ser Ql: NONREACTIVE

## 2019-11-25 DIAGNOSIS — R630 Anorexia: Secondary | ICD-10-CM | POA: Diagnosis not present

## 2019-11-25 DIAGNOSIS — G2581 Restless legs syndrome: Secondary | ICD-10-CM | POA: Diagnosis not present

## 2019-11-25 DIAGNOSIS — J309 Allergic rhinitis, unspecified: Secondary | ICD-10-CM | POA: Diagnosis not present

## 2019-11-25 DIAGNOSIS — I10 Essential (primary) hypertension: Secondary | ICD-10-CM | POA: Diagnosis not present

## 2019-11-26 ENCOUNTER — Other Ambulatory Visit: Payer: Self-pay | Admitting: Infectious Disease

## 2019-11-26 DIAGNOSIS — I214 Non-ST elevation (NSTEMI) myocardial infarction: Secondary | ICD-10-CM

## 2019-12-23 DIAGNOSIS — R630 Anorexia: Secondary | ICD-10-CM | POA: Diagnosis not present

## 2019-12-23 DIAGNOSIS — G2581 Restless legs syndrome: Secondary | ICD-10-CM | POA: Diagnosis not present

## 2019-12-23 DIAGNOSIS — I10 Essential (primary) hypertension: Secondary | ICD-10-CM | POA: Diagnosis not present

## 2019-12-23 DIAGNOSIS — J309 Allergic rhinitis, unspecified: Secondary | ICD-10-CM | POA: Diagnosis not present

## 2020-01-16 ENCOUNTER — Other Ambulatory Visit: Payer: Self-pay

## 2020-01-16 DIAGNOSIS — I214 Non-ST elevation (NSTEMI) myocardial infarction: Secondary | ICD-10-CM

## 2020-01-16 MED ORDER — ATORVASTATIN CALCIUM 40 MG PO TABS: 40.0000 mg | ORAL_TABLET | Freq: Every evening | ORAL | 0 refills | Status: DC

## 2020-01-18 ENCOUNTER — Other Ambulatory Visit: Payer: Self-pay | Admitting: Infectious Disease

## 2020-01-18 DIAGNOSIS — B20 Human immunodeficiency virus [HIV] disease: Secondary | ICD-10-CM

## 2020-01-20 DIAGNOSIS — B20 Human immunodeficiency virus [HIV] disease: Secondary | ICD-10-CM | POA: Diagnosis not present

## 2020-01-20 DIAGNOSIS — I10 Essential (primary) hypertension: Secondary | ICD-10-CM | POA: Diagnosis not present

## 2020-01-20 DIAGNOSIS — G2581 Restless legs syndrome: Secondary | ICD-10-CM | POA: Diagnosis not present

## 2020-01-20 DIAGNOSIS — R21 Rash and other nonspecific skin eruption: Secondary | ICD-10-CM | POA: Diagnosis not present

## 2020-02-17 DIAGNOSIS — I10 Essential (primary) hypertension: Secondary | ICD-10-CM | POA: Diagnosis not present

## 2020-02-17 DIAGNOSIS — G2581 Restless legs syndrome: Secondary | ICD-10-CM | POA: Diagnosis not present

## 2020-03-17 ENCOUNTER — Other Ambulatory Visit: Payer: Self-pay | Admitting: Infectious Disease

## 2020-03-17 DIAGNOSIS — I214 Non-ST elevation (NSTEMI) myocardial infarction: Secondary | ICD-10-CM

## 2020-03-19 DIAGNOSIS — J309 Allergic rhinitis, unspecified: Secondary | ICD-10-CM | POA: Diagnosis not present

## 2020-03-19 DIAGNOSIS — R630 Anorexia: Secondary | ICD-10-CM | POA: Diagnosis not present

## 2020-03-19 DIAGNOSIS — I10 Essential (primary) hypertension: Secondary | ICD-10-CM | POA: Diagnosis not present

## 2020-03-19 DIAGNOSIS — G2581 Restless legs syndrome: Secondary | ICD-10-CM | POA: Diagnosis not present

## 2020-04-13 DIAGNOSIS — I10 Essential (primary) hypertension: Secondary | ICD-10-CM | POA: Diagnosis not present

## 2020-04-13 DIAGNOSIS — G2581 Restless legs syndrome: Secondary | ICD-10-CM | POA: Diagnosis not present

## 2020-04-21 ENCOUNTER — Ambulatory Visit: Payer: Medicare Other | Admitting: Infectious Disease

## 2020-05-09 ENCOUNTER — Other Ambulatory Visit: Payer: Self-pay | Admitting: Infectious Disease

## 2020-05-09 DIAGNOSIS — I214 Non-ST elevation (NSTEMI) myocardial infarction: Secondary | ICD-10-CM

## 2020-05-09 DIAGNOSIS — B20 Human immunodeficiency virus [HIV] disease: Secondary | ICD-10-CM

## 2020-05-11 DIAGNOSIS — I10 Essential (primary) hypertension: Secondary | ICD-10-CM | POA: Diagnosis not present

## 2020-05-11 DIAGNOSIS — R630 Anorexia: Secondary | ICD-10-CM | POA: Diagnosis not present

## 2020-05-11 DIAGNOSIS — J309 Allergic rhinitis, unspecified: Secondary | ICD-10-CM | POA: Diagnosis not present

## 2020-05-11 DIAGNOSIS — G2581 Restless legs syndrome: Secondary | ICD-10-CM | POA: Diagnosis not present

## 2020-05-13 ENCOUNTER — Ambulatory Visit: Payer: Medicaid Other | Admitting: Infectious Disease

## 2020-05-29 ENCOUNTER — Telehealth: Payer: Self-pay

## 2020-05-29 NOTE — Telephone Encounter (Signed)
Reached out to patient, he is currently living in ALF at [facility]. Spoke with Director to schedule appointment. Appointment confirmed for next week. Will place refills on hold until visit on 4/5. Thanks

## 2020-05-29 NOTE — Telephone Encounter (Signed)
To improve he does need to reschedule with me since he missed his recent visit

## 2020-05-29 NOTE — Telephone Encounter (Signed)
Received notification request for new rx for Atorvastatin for a 90 day supply. Routing to provider for approval. Valarie Cones

## 2020-05-31 NOTE — Telephone Encounter (Signed)
AWESOME!

## 2020-06-02 ENCOUNTER — Other Ambulatory Visit: Payer: Self-pay

## 2020-06-02 ENCOUNTER — Encounter: Payer: Self-pay | Admitting: Infectious Disease

## 2020-06-02 ENCOUNTER — Ambulatory Visit (INDEPENDENT_AMBULATORY_CARE_PROVIDER_SITE_OTHER): Payer: Medicare Other | Admitting: Infectious Disease

## 2020-06-02 VITALS — BP 126/78 | HR 59 | Temp 98.0°F | Wt 141.0 lb

## 2020-06-02 DIAGNOSIS — F17200 Nicotine dependence, unspecified, uncomplicated: Secondary | ICD-10-CM | POA: Diagnosis not present

## 2020-06-02 DIAGNOSIS — Z79899 Other long term (current) drug therapy: Secondary | ICD-10-CM | POA: Diagnosis not present

## 2020-06-02 DIAGNOSIS — B20 Human immunodeficiency virus [HIV] disease: Secondary | ICD-10-CM

## 2020-06-02 DIAGNOSIS — I214 Non-ST elevation (NSTEMI) myocardial infarction: Secondary | ICD-10-CM

## 2020-06-02 DIAGNOSIS — F0280 Dementia in other diseases classified elsewhere without behavioral disturbance: Secondary | ICD-10-CM

## 2020-06-02 DIAGNOSIS — A812 Progressive multifocal leukoencephalopathy: Secondary | ICD-10-CM | POA: Diagnosis not present

## 2020-06-02 DIAGNOSIS — L0231 Cutaneous abscess of buttock: Secondary | ICD-10-CM

## 2020-06-02 DIAGNOSIS — J449 Chronic obstructive pulmonary disease, unspecified: Secondary | ICD-10-CM | POA: Diagnosis not present

## 2020-06-02 DIAGNOSIS — F1010 Alcohol abuse, uncomplicated: Secondary | ICD-10-CM

## 2020-06-02 NOTE — Progress Notes (Signed)
Chief complaint: followup for HIV disease   Subjective:    Patient ID: Brian Marquez, male    DOB: 1960/11/15, 60 y.o.   MRN: [id number]  HIV Positive/AIDS   60 year old Caucasian man with HIV/AIDS, prior polysubstance abuse in the past HIV encephalopathy still living in ALF facility. Phone number is  [phone]. He has been compliant with  to [facility].   Brian Marquez does have comorbid coronary artery disease, HIV dementia smoking.  He had RPR + to 1:4 and we gave him bicillin x 1.  He has now had 3 doses of vaccine for COVID the most recent one in November.  He continues to smoke promised me that he would 1 day (which he said laughingly)        Past Medical History:  Diagnosis Date  . Abscess of buttock, right 10/17/2019  . Alcohol abuse 03/31/2015  . Alcoholism (HCC)   . Alcoholism (HCC) 08/18/2014  . Coma (HCC)   . 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 Encephalopathy   . 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 HIV infection (HCC)   . 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  No family history on file.    Social History   Socioeconomic History  . Marital status: Single    Spouse name: Not on file  . Number of children: Not on file  . Years of education: Not on file  . Highest education level: Not on file  Occupational History  . Not on file  Tobacco Use  . Smoking status: Current Every Day Smoker    Packs/day: 0.50    Types: Cigarettes  . Smokeless tobacco: Never Used  . Tobacco comment: cutting back  Substance and Sexual Activity  . Alcohol use: Yes    Alcohol/week: 0.0 standard drinks    Comment: Occassional, 40oz when he does drink  . Drug use: No  . Sexual activity: Never    Partners: Female    Comment: declined condoms  Other Topics Concern  . Not on file  Social History Narrative  . Not on file   Social Determinants of Health   Financial Resource Strain: Not on file  Food Insecurity: Not on file  Transportation  Needs: Not on file  Physical Activity: Not on file  Stress: Not on file  Social Connections: Not on file    No Known Allergies   Current Outpatient Medications:  .  atorvastatin (LIPITOR) 40 MG tablet, TAKE 1 TABLET BY MOUTH ONCE DAILY IN THE EVENING, Disp: 30 tablet, Rfl: 0 .  BIKTARVY 50-200-25 MG TABS tablet, TAKE 1 TABLET BY MOUTH ONCE DAILY., Disp: 30 tablet, Rfl: 0 .  fexofenadine (ALLEGRA) 180 MG tablet, TAKE 1 TABLET BY MOUTH ONCE DAILY., Disp: 30 tablet, Rfl: 0 .  gemfibrozil (LOPID) 600 MG tablet, Take 600 mg by mouth 2 (two) times daily., Disp: , Rfl:  .  GNP ASPIRIN LOW DOSE 81 MG EC tablet, TAKE 1 TABLET BY MOUTH ONCE DAILY., Disp: 90 tablet, Rfl: 0 .  ibuprofen (ADVIL) 800 MG tablet, Take 800 mg by mouth 2 (two) times daily as needed., Disp: , Rfl:  .  metoprolol tartrate (LOPRESSOR) 25 MG tablet, Take 25 mg by mouth daily., Disp: , Rfl:  .  traMADol (ULTRAM) 50 MG tablet, Take 50 mg by mouth 2 (two) times daily as needed., Disp: , Rfl:  .  triamcinolone cream (KENALOG) 0.5 %, APPLY AS DIRECTED TO AFFECTED AREA TWICE DAILY AS  NEEDED *MAY SELF APPLY AT BEDTIME*, Disp: 30 g, Rfl: 0 .  disulfiram (ANTABUSE) 250 MG tablet, Take 500 mg by mouth daily., Disp: , Rfl:  .  Fluticasone-Salmeterol (ADVAIR) 250-50 MCG/DOSE AEPB, Inhale 1 puff into the lungs every 12 (twelve) hours., Disp: , Rfl:  .  gabapentin (NEURONTIN) 300 MG capsule, TAKE 1 CAPSULE BY MOUTH AT BEDTIME (Patient not taking: No sig reported), Disp: 30 capsule, Rfl: 5 .  mirtazapine (REMERON) 7.5 MG tablet, TAKE ONE TABLET BY MOUTH AT BEDTIME. (Patient not taking: No sig reported), Disp: 30 tablet, Rfl: 5 .  nitroGLYCERIN (NITROSTAT) 0.4 MG SL tablet, Place 1 tablet (0.4 mg total) under the tongue every 5 (five) minutes x 3 doses as needed for chest pain. (Patient not taking: No sig reported), Disp: 30 tablet, Rfl: 0    Review of Systems  Unable to perform ROS: Dementia  Constitutional: Negative for activity change,  appetite change, chills, diaphoresis, fatigue, fever and unexpected weight change.  HENT: Negative for congestion, rhinorrhea, sinus pressure, sneezing, sore throat and trouble swallowing.   Eyes: Negative for photophobia and visual disturbance.  Respiratory: Negative for cough, chest tightness, shortness of breath, wheezing and stridor.   Cardiovascular: Negative for chest pain, palpitations and leg swelling.  Gastrointestinal: Negative for abdominal distention, abdominal pain, anal bleeding, blood in stool, constipation, diarrhea, nausea and vomiting.  Genitourinary: Negative for difficulty urinating, dysuria, flank pain and hematuria.  Musculoskeletal: Negative for arthralgias, back pain, gait problem, joint swelling and myalgias.  Skin: Positive for wound. Negative for color change, pallor and rash.  Neurological: Negative for dizziness, tremors, weakness and light-headedness.  Hematological: Negative for adenopathy. Does not bruise/bleed easily.  Psychiatric/Behavioral: Negative for agitation, behavioral problems, confusion, decreased concentration, dysphoric mood and sleep disturbance.       Objective:   Physical Exam Constitutional:      Appearance: He is well-developed.  HENT:     Head: Normocephalic and atraumatic.  Eyes:     Conjunctiva/sclera: Conjunctivae normal.  Cardiovascular:     Rate and Rhythm: Normal rate and regular rhythm.     Heart sounds: Normal heart sounds. No murmur heard. No friction rub. No gallop.   Pulmonary:     Effort: Pulmonary effort is normal. No respiratory distress.     Breath sounds: Normal breath sounds. No wheezing or rales.  Chest:     Chest wall: No tenderness.  Abdominal:     General: Bowel sounds are normal. There is no distension.     Palpations: Abdomen is soft.     Tenderness: There is no abdominal tenderness.  Musculoskeletal:        General: No tenderness. Normal range of motion.     Cervical back: Normal range of motion and neck  supple.  Skin:    General: Skin is warm and dry.     Coloration: Skin is not pale.     Findings: No erythema or rash.  Neurological:     General: No focal deficit present.     Mental Status: He is alert and oriented to person, place, and time.  Psychiatric:        Attention and Perception: Attention normal.        Mood and Affect: Mood normal.        Speech: Speech normal.        Behavior: Behavior normal.        Cognition and Memory: Memory is impaired.         Assessment & Plan:  HIV: Recheck labs continue Biktarvy return to clinic in 6 months Dementia: undoubtedly still with  cognitive deficits and I DON'T THINK HE IS COMPETENT TO CARE FOR HIMSELF. Seizure: No recent seizures  Smoking: Will be great for his overall health and his coronary disease if he would stop smoking  CAD with NSTEMI: Continue statin, aspirin,  Covid prevention : had 3 doses of Pfizer  Buttocks abscess: seems to have been successfully drained. I agree with doxycycline.   I spent greater than 25  minutes with the patient including greater than 50% of time in face to face counsel of the patient and in coordination of their care.

## 2020-06-03 ENCOUNTER — Telehealth: Payer: Self-pay

## 2020-06-03 NOTE — Telephone Encounter (Addendum)
Patient currently resides in a family living facility [facility].) Staff at facility returned call and requested most recent labs be faxed to provide to their clinicians on site. Faxed most recent CMP with creatinine.   Fax: [phone]  Rosanna Randy, RN

## 2020-06-03 NOTE — Telephone Encounter (Signed)
Called patient to discuss elevated creatinine per Dr. Daiva Eves and to request follow up with PCP. Left voicemail requesting call back. Forwarding to triage for continued follow up.  Ayaansh Smail Loyola Mast, RN

## 2020-06-04 LAB — RPR: RPR Ser Ql: NONREACTIVE

## 2020-06-04 LAB — COMPLETE METABOLIC PANEL WITH GFR
AG Ratio: 1.8 (calc) (ref 1.0–2.5)
ALT: 11 U/L (ref 9–46)
AST: 23 U/L (ref 10–35)
Albumin: 4.3 g/dL (ref 3.6–5.1)
Alkaline phosphatase (APISO): 114 U/L (ref 35–144)
BUN/Creatinine Ratio: 9 (calc) (ref 6–22)
BUN: 17 mg/dL (ref 7–25)
CO2: 24 mmol/L (ref 20–32)
Calcium: 9.7 mg/dL (ref 8.6–10.3)
Chloride: 104 mmol/L (ref 98–110)
Creat: 1.85 mg/dL — ABNORMAL HIGH (ref 0.70–1.25)
GFR, Est African American: 45 mL/min/{1.73_m2} — ABNORMAL LOW (ref >=60)
GFR, Est Non African American: 39 mL/min/{1.73_m2} — ABNORMAL LOW (ref >=60)
Globulin: 2.4 g/dL (calc) (ref 1.9–3.7)
Glucose, Bld: 100 mg/dL — ABNORMAL HIGH (ref 65–99)
Potassium: 4.3 mmol/L (ref 3.5–5.3)
Sodium: 139 mmol/L (ref 135–146)
Total Bilirubin: 0.4 mg/dL (ref 0.2–1.2)
Total Protein: 6.7 g/dL (ref 6.1–8.1)

## 2020-06-04 LAB — CBC WITH DIFFERENTIAL/PLATELET
Absolute Monocytes: 517 cells/uL (ref 200–950)
Basophils Absolute: 48 cells/uL (ref 0–200)
Basophils Relative: 0.7 %
Eosinophils Absolute: 143 cells/uL (ref 15–500)
Eosinophils Relative: 2.1 %
HCT: 41.6 % (ref 38.5–50.0)
Hemoglobin: 14.4 g/dL (ref 13.2–17.1)
Lymphs Abs: 1843 cells/uL (ref 850–3900)
MCH: 34.8 pg — ABNORMAL HIGH (ref 27.0–33.0)
MCHC: 34.6 g/dL (ref 32.0–36.0)
MCV: 100.5 fL — ABNORMAL HIGH (ref 80.0–100.0)
MPV: 9.8 fL (ref 7.5–12.5)
Monocytes Relative: 7.6 %
Neutro Abs: 4250 cells/uL (ref 1500–7800)
Neutrophils Relative %: 62.5 %
Platelets: 248 10*3/uL (ref 140–400)
RBC: 4.14 10*6/uL — ABNORMAL LOW (ref 4.20–5.80)
RDW: 13 % (ref 11.0–15.0)
Total Lymphocyte: 27.1 %
WBC: 6.8 10*3/uL (ref 3.8–10.8)

## 2020-06-04 LAB — LIPID PANEL
Cholesterol: 108 mg/dL (ref <200)
HDL: 43 mg/dL (ref >=40)
LDL Cholesterol (Calc): 47 mg/dL (calc)
Non-HDL Cholesterol (Calc): 65 mg/dL (calc) (ref <130)
Total CHOL/HDL Ratio: 2.5 (calc) (ref <5.0)
Triglycerides: 93 mg/dL (ref <150)

## 2020-06-04 LAB — HIV-1 RNA QUANT-NO REFLEX-BLD
HIV 1 RNA Quant: <20 Copies/mL — ABNORMAL HIGH
HIV-1 RNA Quant, Log: <1.3 Log cps/mL — ABNORMAL HIGH

## 2020-06-06 ENCOUNTER — Other Ambulatory Visit: Payer: Self-pay | Admitting: Infectious Disease

## 2020-06-06 DIAGNOSIS — I214 Non-ST elevation (NSTEMI) myocardial infarction: Secondary | ICD-10-CM

## 2020-06-08 DIAGNOSIS — J309 Allergic rhinitis, unspecified: Secondary | ICD-10-CM | POA: Diagnosis not present

## 2020-06-08 DIAGNOSIS — N1832 Chronic kidney disease, stage 3b: Secondary | ICD-10-CM | POA: Diagnosis not present

## 2020-06-08 DIAGNOSIS — G64 Other disorders of peripheral nervous system: Secondary | ICD-10-CM | POA: Diagnosis not present

## 2020-06-08 DIAGNOSIS — I10 Essential (primary) hypertension: Secondary | ICD-10-CM | POA: Diagnosis not present

## 2020-07-06 DIAGNOSIS — D696 Thrombocytopenia, unspecified: Secondary | ICD-10-CM | POA: Diagnosis not present

## 2020-07-06 DIAGNOSIS — I10 Essential (primary) hypertension: Secondary | ICD-10-CM | POA: Diagnosis not present

## 2020-07-06 DIAGNOSIS — E785 Hyperlipidemia, unspecified: Secondary | ICD-10-CM | POA: Diagnosis not present

## 2020-07-06 DIAGNOSIS — D649 Anemia, unspecified: Secondary | ICD-10-CM | POA: Diagnosis not present

## 2020-07-08 ENCOUNTER — Other Ambulatory Visit: Payer: Self-pay | Admitting: Infectious Disease

## 2020-07-08 DIAGNOSIS — I214 Non-ST elevation (NSTEMI) myocardial infarction: Secondary | ICD-10-CM

## 2020-07-08 DIAGNOSIS — E539 Vitamin B deficiency, unspecified: Secondary | ICD-10-CM | POA: Diagnosis not present

## 2020-07-08 DIAGNOSIS — E559 Vitamin D deficiency, unspecified: Secondary | ICD-10-CM | POA: Diagnosis not present

## 2020-07-08 DIAGNOSIS — I10 Essential (primary) hypertension: Secondary | ICD-10-CM | POA: Diagnosis not present

## 2020-07-08 DIAGNOSIS — E785 Hyperlipidemia, unspecified: Secondary | ICD-10-CM | POA: Diagnosis not present

## 2020-07-08 DIAGNOSIS — E119 Type 2 diabetes mellitus without complications: Secondary | ICD-10-CM | POA: Diagnosis not present

## 2020-07-08 DIAGNOSIS — B20 Human immunodeficiency virus [HIV] disease: Secondary | ICD-10-CM

## 2020-07-08 DIAGNOSIS — N400 Enlarged prostate without lower urinary tract symptoms: Secondary | ICD-10-CM | POA: Diagnosis not present

## 2020-08-04 ENCOUNTER — Other Ambulatory Visit: Payer: Self-pay | Admitting: Infectious Disease

## 2020-08-10 DIAGNOSIS — S61401D Unspecified open wound of right hand, subsequent encounter: Secondary | ICD-10-CM | POA: Diagnosis not present

## 2020-08-10 DIAGNOSIS — D649 Anemia, unspecified: Secondary | ICD-10-CM | POA: Diagnosis not present

## 2020-08-10 DIAGNOSIS — I10 Essential (primary) hypertension: Secondary | ICD-10-CM | POA: Diagnosis not present

## 2020-08-10 DIAGNOSIS — D696 Thrombocytopenia, unspecified: Secondary | ICD-10-CM | POA: Diagnosis not present

## 2020-09-04 ENCOUNTER — Other Ambulatory Visit: Payer: Self-pay

## 2020-09-04 DIAGNOSIS — I214 Non-ST elevation (NSTEMI) myocardial infarction: Secondary | ICD-10-CM

## 2020-11-17 DIAGNOSIS — Z79899 Other long term (current) drug therapy: Secondary | ICD-10-CM | POA: Diagnosis not present

## 2020-11-24 ENCOUNTER — Other Ambulatory Visit: Payer: Self-pay | Admitting: Infectious Disease

## 2020-11-24 DIAGNOSIS — B20 Human immunodeficiency virus [HIV] disease: Secondary | ICD-10-CM

## 2020-11-24 DIAGNOSIS — I214 Non-ST elevation (NSTEMI) myocardial infarction: Secondary | ICD-10-CM

## 2020-11-24 NOTE — Telephone Encounter (Signed)
Please advise on refill request for Atorvastatin and Aspirin. No PCP listed

## 2020-12-01 ENCOUNTER — Ambulatory Visit: Payer: Medicare Other

## 2020-12-01 ENCOUNTER — Encounter: Payer: Self-pay | Admitting: Infectious Disease

## 2020-12-01 ENCOUNTER — Other Ambulatory Visit: Payer: Self-pay

## 2020-12-01 ENCOUNTER — Ambulatory Visit (INDEPENDENT_AMBULATORY_CARE_PROVIDER_SITE_OTHER): Payer: Medicare Other | Admitting: Infectious Disease

## 2020-12-01 VITALS — BP 102/68 | HR 60 | Temp 98.2°F | Wt 131.2 lb

## 2020-12-01 DIAGNOSIS — Z23 Encounter for immunization: Secondary | ICD-10-CM

## 2020-12-01 DIAGNOSIS — F17200 Nicotine dependence, unspecified, uncomplicated: Secondary | ICD-10-CM

## 2020-12-01 DIAGNOSIS — B20 Human immunodeficiency virus [HIV] disease: Secondary | ICD-10-CM | POA: Diagnosis not present

## 2020-12-01 DIAGNOSIS — A812 Progressive multifocal leukoencephalopathy: Secondary | ICD-10-CM | POA: Diagnosis not present

## 2020-12-01 DIAGNOSIS — F1910 Other psychoactive substance abuse, uncomplicated: Secondary | ICD-10-CM

## 2020-12-01 DIAGNOSIS — F0280 Dementia in other diseases classified elsewhere without behavioral disturbance: Secondary | ICD-10-CM

## 2020-12-01 DIAGNOSIS — R569 Unspecified convulsions: Secondary | ICD-10-CM | POA: Insufficient documentation

## 2020-12-01 DIAGNOSIS — E785 Hyperlipidemia, unspecified: Secondary | ICD-10-CM | POA: Insufficient documentation

## 2020-12-01 DIAGNOSIS — E782 Mixed hyperlipidemia: Secondary | ICD-10-CM

## 2020-12-01 DIAGNOSIS — F1020 Alcohol dependence, uncomplicated: Secondary | ICD-10-CM

## 2020-12-01 DIAGNOSIS — G909 Disorder of the autonomic nervous system, unspecified: Secondary | ICD-10-CM

## 2020-12-01 HISTORY — DX: Unspecified convulsions: R56.9

## 2020-12-01 HISTORY — DX: Hyperlipidemia, unspecified: E78.5

## 2020-12-01 MED ORDER — BIKTARVY 50-200-25 MG PO TABS: 1.0000 | ORAL_TABLET | Freq: Every day | ORAL | 11 refills | Status: DC

## 2020-12-01 NOTE — Progress Notes (Signed)
   Covid-19 Vaccination Clinic  Name:  Brian Marquez    MRN: [id number] DOB: 10/22/1960  12/01/2020  [name] was observed post Covid-19 immunization for 15 minutes without incident. He was provided with Vaccine Information Sheet and instruction to access the V-Safe system.   [name] was instructed to call 911 with any severe reactions post vaccine: Difficulty breathing  Swelling of face and throat  A fast heartbeat  A bad rash all over body  Dizziness and weakness

## 2020-12-01 NOTE — Addendum Note (Signed)
Addended by: Harley Alto on: 12/01/2020 02:35 PM   Modules accepted: Orders

## 2020-12-01 NOTE — Progress Notes (Signed)
   Covid-19 Vaccination Clinic  Name:  Brian Marquez    MRN: [id number] DOB: 09-13-1960  12/01/2020  [name] was observed post Covid-19 immunization for 15 minutes without incident. He was provided with Vaccine Information Sheet and instruction to access the V-Safe system.   [name] was instructed to call 911 with any severe reactions post vaccine: Difficulty breathing  Swelling of face and throat  A fast heartbeat  A bad rash all over body  Dizziness and weakness     Juanita Laster, RMA

## 2020-12-01 NOTE — Progress Notes (Signed)
Chief complaint: Follow-up for HIV disease  Subjective:    Patient ID: Brian Marquez, male    DOB: 1960-05-11, 59 y.o.   MRN: [id number]  HIV Positive/AIDS  60 year old Caucasian man with HIV/AIDS, prior polysubstance abuse in the past HIV encephalopathy still living in ALF facility. Phone number is  [phone]. He has been compliant with  to [facility]   Brian Marquez does have comorbid coronary artery disease, HIV dementia smoking.  He had RPR + to 1:4 and we gave him bicillin x 1.  Since I last saw Brian Marquez in April he was seen in the ER at [facility] due to an apparent seizure and placed on Keppra.  He denies drinking heavily and does not think that this was an alcohol withdrawal seizure when I asked him about this closely.  He does continue to smoke a half a pack of cigarettes per day.       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  No family history on file.    Social History   Socioeconomic History   Marital status: Single    Spouse name: Not on file   Number of children: Not on file   Years of education: Not on file   Highest education level: Not on file  Occupational History   Not on file  Tobacco Use   Smoking status: Every Day    Packs/day: 0.50    Types: Cigarettes   Smokeless tobacco: Never  Substance and Sexual Activity   Alcohol use: Yes    Alcohol/week: 0.0 standard drinks    Comment: Occassional, 40oz when he does drink   Drug use: No   Sexual activity: Never    Partners: Female    Comment: declined condoms  Other Topics Concern   Not on file  Social History Narrative   Not on file   Social  Determinants of Health   Financial Resource Strain: Not on file  Food Insecurity: Not on file  Transportation Needs: Not on file  Physical Activity: Not on file  Stress: Not on file  Social Connections: Not on file    No Known Allergies   Current Outpatient Medications:    atorvastatin (LIPITOR) 40 MG tablet, TAKE 1 TABLET BY MOUTH ONCE DAILY IN THE EVENING, Disp: 30 tablet, Rfl: 0   fexofenadine (ALLEGRA) 180 MG tablet, TAKE 1 TABLET BY MOUTH ONCE DAILY., Disp: 30 tablet, Rfl: 5   GNP ASPIRIN LOW DOSE 81 MG EC tablet, TAKE 1 TABLET BY MOUTH ONCE DAILY., Disp: 30 tablet, Rfl: 0   levETIRAcetam (KEPPRA) 500 MG tablet, Take by mouth., Disp: , Rfl:    metoprolol tartrate (LOPRESSOR) 25 MG tablet, Take 25 mg by mouth daily., Disp: , Rfl:    traMADol (ULTRAM) 50 MG tablet, Take 50 mg by mouth 2 (two) times daily as needed., Disp: , Rfl:    triamcinolone cream (KENALOG) 0.5 %, APPLY AS DIRECTED TO AFFECTED AREA TWICE DAILY AS NEEDED *MAY SELF APPLY AT BEDTIME*, Disp: 30 g, Rfl: 0   bictegravir-emtricitabine-tenofovir AF (BIKTARVY) 50-200-25 MG TABS tablet, Take 1 tablet by  mouth daily., Disp: 30 tablet, Rfl: 11   disulfiram (ANTABUSE) 250 MG tablet, Take 500 mg by mouth daily., Disp: , Rfl:    Fluticasone-Salmeterol (ADVAIR) 250-50 MCG/DOSE AEPB, Inhale 1 puff into the lungs every 12 (twelve) hours., Disp: , Rfl:    gabapentin (NEURONTIN) 300 MG capsule, TAKE 1 CAPSULE BY MOUTH AT BEDTIME (Patient not taking: No sig reported), Disp: 30 capsule, Rfl: 5   gemfibrozil (LOPID) 600 MG tablet, Take 600 mg by mouth 2 (two) times daily., Disp: , Rfl:    ibuprofen (ADVIL) 800 MG tablet, Take 800 mg by mouth 2 (two) times daily as needed., Disp: , Rfl:    mirtazapine (REMERON) 7.5 MG tablet, TAKE ONE TABLET BY MOUTH AT BEDTIME. (Patient not taking: No sig reported), Disp: 30 tablet, Rfl: 5   nitroGLYCERIN (NITROSTAT) 0.4 MG SL tablet, Place 1 tablet (0.4 mg total) under the tongue every 5 (five) minutes  x 3 doses as needed for chest pain. (Patient not taking: No sig reported), Disp: 30 tablet, Rfl: 0    Review of Systems  Constitutional:  Negative for activity change, appetite change, chills, diaphoresis, fatigue, fever and unexpected weight change.  HENT:  Negative for congestion, rhinorrhea, sinus pressure, sneezing, sore throat and trouble swallowing.   Eyes:  Negative for photophobia and visual disturbance.  Respiratory:  Negative for cough, chest tightness, shortness of breath, wheezing and stridor.   Cardiovascular:  Negative for chest pain, palpitations and leg swelling.  Gastrointestinal:  Negative for abdominal distention, abdominal pain, anal bleeding, blood in stool, constipation, diarrhea, nausea and vomiting.  Genitourinary:  Negative for difficulty urinating, dysuria, flank pain and hematuria.  Musculoskeletal:  Negative for arthralgias, back pain, gait problem, joint swelling and myalgias.  Skin:  Negative for color change, pallor, rash and wound.  Neurological:  Negative for dizziness, tremors, weakness and light-headedness.  Hematological:  Negative for adenopathy. Does not bruise/bleed easily.  Psychiatric/Behavioral:  Negative for agitation, behavioral problems, confusion, decreased concentration, dysphoric mood and sleep disturbance.       Objective:   Physical Exam Constitutional:      Appearance: He is well-developed.  HENT:     Head: Normocephalic and atraumatic.     Mouth/Throat:     Pharynx: Oropharynx is clear. Uvula midline.     Comments: Edentulous Eyes:     Conjunctiva/sclera: Conjunctivae normal.  Cardiovascular:     Rate and Rhythm: Normal rate and regular rhythm.     Heart sounds:    Friction rub present.  Pulmonary:     Effort: Pulmonary effort is normal. No respiratory distress.     Breath sounds: No wheezing.  Abdominal:     General: There is no distension.  Musculoskeletal:        General: No tenderness. Normal range of motion.      Cervical back: Normal range of motion and neck supple.  Skin:    General: Skin is warm and dry.     Coloration: Skin is not pale.     Findings: No erythema or rash.  Neurological:     General: No focal deficit present.     Mental Status: He is alert and oriented to person, place, and time.  Psychiatric:        Attention and Perception: Attention normal.        Mood and Affect: Mood normal.        Speech: Speech normal.        Behavior: Behavior normal.  Cognition and Memory: Memory is impaired.        Assessment & Plan:  HIV disease:  I am rechecking an HIV viral load CD4 count CBC with differential CMP RPR GC chlamydia  I am continuing his BIKTARVY prescription  HIV dementia: he does NOT have capacity to make independent decisions  Seizure disorder: On Keppra  Coronary artery disease: He is on 81 mg of aspirin as well as Lipitor and metoprolol  It would be very helpful he could stop smoking.  Smoking: Encouraged him to at least try to change over to vaping to remove all of the carcinogens and smoke products besides the nicotine.  Insomnia continue Remeron.  Neuropathy continue gabapentin.  Hyperlipidemia he is on Lipitor and gemfibrozil  Alcoholism: He is minimizing how much this is happening now.  Vaccine counseling he agreed to get vaccinated with the updated COVID bivalent booster and also with flu vaccine

## 2020-12-03 LAB — CBC WITH DIFFERENTIAL/PLATELET
Absolute Monocytes: 384 cells/uL (ref 200–950)
Basophils Absolute: 63 cells/uL (ref 0–200)
Basophils Relative: 1 %
Eosinophils Absolute: 82 cells/uL (ref 15–500)
Eosinophils Relative: 1.3 %
HCT: 40.7 % (ref 38.5–50.0)
Hemoglobin: 14.2 g/dL (ref 13.2–17.1)
Lymphs Abs: 1380 cells/uL (ref 850–3900)
MCH: 35.6 pg — ABNORMAL HIGH (ref 27.0–33.0)
MCHC: 34.9 g/dL (ref 32.0–36.0)
MCV: 102 fL — ABNORMAL HIGH (ref 80.0–100.0)
MPV: 9.8 fL (ref 7.5–12.5)
Monocytes Relative: 6.1 %
Neutro Abs: 4391 cells/uL (ref 1500–7800)
Neutrophils Relative %: 69.7 %
Platelets: 256 10*3/uL (ref 140–400)
RBC: 3.99 10*6/uL — ABNORMAL LOW (ref 4.20–5.80)
RDW: 13 % (ref 11.0–15.0)
Total Lymphocyte: 21.9 %
WBC: 6.3 10*3/uL (ref 3.8–10.8)

## 2020-12-03 LAB — HELPER T-LYMPH-CD4 (ARMC ONLY)
% CD 4 Pos. Lymph.: 35.4 % (ref 30.8–58.5)
Absolute CD 4 Helper: 496 /uL (ref 359–1519)
Basophils Absolute: 0.1 10*3/uL (ref 0.0–0.2)
Basos: 1 %
EOS (ABSOLUTE): 0.1 10*3/uL (ref 0.0–0.4)
Eos: 1 %
Hematocrit: 42.1 % (ref 37.5–51.0)
Hemoglobin: 14.2 g/dL (ref 13.0–17.7)
Immature Grans (Abs): 0 10*3/uL (ref 0.0–0.1)
Immature Granulocytes: 0 %
Lymphocytes Absolute: 1.4 10*3/uL (ref 0.7–3.1)
Lymphs: 22 %
MCH: 34.7 pg — ABNORMAL HIGH (ref 26.6–33.0)
MCHC: 33.7 g/dL (ref 31.5–35.7)
MCV: 103 fL — ABNORMAL HIGH (ref 79–97)
Monocytes Absolute: 0.4 10*3/uL (ref 0.1–0.9)
Monocytes: 6 %
Neutrophils Absolute: 4.4 10*3/uL (ref 1.4–7.0)
Neutrophils: 70 %
Platelets: 275 10*3/uL (ref 150–450)
RBC: 4.09 x10E6/uL — ABNORMAL LOW (ref 4.14–5.80)
RDW: 12.2 % (ref 11.6–15.4)
WBC: 6.3 10*3/uL (ref 3.4–10.8)

## 2020-12-03 LAB — LIPID PANEL
Cholesterol: 114 mg/dL (ref <200)
HDL: 51 mg/dL (ref >=40)
LDL Cholesterol (Calc): 47 mg/dL (calc)
Non-HDL Cholesterol (Calc): 63 mg/dL (calc) (ref <130)
Total CHOL/HDL Ratio: 2.2 (calc) (ref <5.0)
Triglycerides: 78 mg/dL (ref <150)

## 2020-12-03 LAB — COMPLETE METABOLIC PANEL WITH GFR
AG Ratio: 1.7 (calc) (ref 1.0–2.5)
ALT: 8 U/L — ABNORMAL LOW (ref 9–46)
AST: 20 U/L (ref 10–35)
Albumin: 4.3 g/dL (ref 3.6–5.1)
Alkaline phosphatase (APISO): 101 U/L (ref 35–144)
BUN/Creatinine Ratio: 8 (calc) (ref 6–22)
BUN: 12 mg/dL (ref 7–25)
CO2: 26 mmol/L (ref 20–32)
Calcium: 9.8 mg/dL (ref 8.6–10.3)
Chloride: 107 mmol/L (ref 98–110)
Creat: 1.56 mg/dL — ABNORMAL HIGH (ref 0.70–1.35)
Globulin: 2.6 g/dL (calc) (ref 1.9–3.7)
Glucose, Bld: 63 mg/dL — ABNORMAL LOW (ref 65–99)
Potassium: 4.8 mmol/L (ref 3.5–5.3)
Sodium: 142 mmol/L (ref 135–146)
Total Bilirubin: 0.3 mg/dL (ref 0.2–1.2)
Total Protein: 6.9 g/dL (ref 6.1–8.1)
eGFR: 51 mL/min/{1.73_m2} — ABNORMAL LOW (ref >=60)

## 2020-12-03 LAB — RPR: RPR Ser Ql: NONREACTIVE

## 2020-12-03 LAB — HIV-1 RNA QUANT-NO REFLEX-BLD
HIV 1 RNA Quant: 93 Copies/mL — ABNORMAL HIGH
HIV-1 RNA Quant, Log: 1.97 Log cps/mL — ABNORMAL HIGH

## 2020-12-21 DIAGNOSIS — G2581 Restless legs syndrome: Secondary | ICD-10-CM | POA: Diagnosis not present

## 2020-12-21 DIAGNOSIS — I10 Essential (primary) hypertension: Secondary | ICD-10-CM | POA: Diagnosis not present

## 2020-12-21 DIAGNOSIS — R630 Anorexia: Secondary | ICD-10-CM | POA: Diagnosis not present

## 2020-12-21 DIAGNOSIS — R110 Nausea: Secondary | ICD-10-CM | POA: Diagnosis not present

## 2021-01-18 ENCOUNTER — Other Ambulatory Visit: Payer: Self-pay | Admitting: Infectious Disease

## 2021-01-18 DIAGNOSIS — G8929 Other chronic pain: Secondary | ICD-10-CM | POA: Diagnosis not present

## 2021-01-18 DIAGNOSIS — G2581 Restless legs syndrome: Secondary | ICD-10-CM | POA: Diagnosis not present

## 2021-01-18 DIAGNOSIS — N1832 Chronic kidney disease, stage 3b: Secondary | ICD-10-CM | POA: Diagnosis not present

## 2021-01-18 DIAGNOSIS — I10 Essential (primary) hypertension: Secondary | ICD-10-CM | POA: Diagnosis not present

## 2021-01-18 DIAGNOSIS — I214 Non-ST elevation (NSTEMI) myocardial infarction: Secondary | ICD-10-CM

## 2021-01-18 NOTE — Telephone Encounter (Signed)
Attempted to call patient regarding refill request. Left VM requesting call back. Need to confirm with pt if he has a PCP. If he does will need have RX filled by them.

## 2021-01-18 NOTE — Telephone Encounter (Signed)
Please advise on refill.

## 2021-01-20 NOTE — Telephone Encounter (Signed)
Called patient to see if he still has PCP. 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 RN stated we would call back later. Did not disclose any personal or health information.   Sandie Ano, RN

## 2021-01-26 NOTE — Telephone Encounter (Signed)
Spoke with patient, he states he does have a PCP and will reach out to his office to have him take over these medications.   Sandie Ano, RN

## 2021-02-10 ENCOUNTER — Other Ambulatory Visit: Payer: Self-pay | Admitting: Infectious Disease

## 2021-02-10 DIAGNOSIS — I214 Non-ST elevation (NSTEMI) myocardial infarction: Secondary | ICD-10-CM

## 2021-02-10 NOTE — Telephone Encounter (Signed)
Do you want to continue refilling these medications. Also should patient be taking Lopid with Atorvastatin?

## 2021-02-10 NOTE — Telephone Encounter (Signed)
Do you want to continue refilling? Also should patient be taking Lopid with the atorvastatin?

## 2021-02-12 ENCOUNTER — Other Ambulatory Visit: Payer: Self-pay

## 2021-02-12 DIAGNOSIS — I214 Non-ST elevation (NSTEMI) myocardial infarction: Secondary | ICD-10-CM

## 2021-02-16 DIAGNOSIS — G8929 Other chronic pain: Secondary | ICD-10-CM | POA: Diagnosis not present

## 2021-02-16 DIAGNOSIS — N1832 Chronic kidney disease, stage 3b: Secondary | ICD-10-CM | POA: Diagnosis not present

## 2021-02-16 DIAGNOSIS — G40909 Epilepsy, unspecified, not intractable, without status epilepticus: Secondary | ICD-10-CM | POA: Diagnosis not present

## 2021-02-16 DIAGNOSIS — I10 Essential (primary) hypertension: Secondary | ICD-10-CM | POA: Diagnosis not present

## 2021-02-16 NOTE — Telephone Encounter (Signed)
Pcp is taking over refilling the medication

## 2021-03-15 ENCOUNTER — Other Ambulatory Visit: Payer: Self-pay | Admitting: Infectious Disease

## 2021-03-15 DIAGNOSIS — G8929 Other chronic pain: Secondary | ICD-10-CM | POA: Diagnosis not present

## 2021-03-15 DIAGNOSIS — G40909 Epilepsy, unspecified, not intractable, without status epilepticus: Secondary | ICD-10-CM | POA: Diagnosis not present

## 2021-03-15 DIAGNOSIS — J309 Allergic rhinitis, unspecified: Secondary | ICD-10-CM | POA: Diagnosis not present

## 2021-03-15 DIAGNOSIS — I214 Non-ST elevation (NSTEMI) myocardial infarction: Secondary | ICD-10-CM

## 2021-03-15 DIAGNOSIS — I10 Essential (primary) hypertension: Secondary | ICD-10-CM | POA: Diagnosis not present

## 2021-03-30 ENCOUNTER — Other Ambulatory Visit: Payer: Self-pay

## 2021-03-30 ENCOUNTER — Emergency Department: Payer: Medicare Other

## 2021-03-30 ENCOUNTER — Emergency Department
Admission: EM | Admit: 2021-03-30 | Discharge: 2021-03-30 | Disposition: A | Discharge status: Home / Self Care | Payer: Medicare Other | Attending: Emergency Medicine | Admitting: Emergency Medicine

## 2021-03-30 DIAGNOSIS — Z21 Asymptomatic human immunodeficiency virus [HIV] infection status: Secondary | ICD-10-CM | POA: Insufficient documentation

## 2021-03-30 DIAGNOSIS — F0390 Unspecified dementia without behavioral disturbance: Secondary | ICD-10-CM | POA: Diagnosis not present

## 2021-03-30 DIAGNOSIS — I959 Hypotension, unspecified: Secondary | ICD-10-CM | POA: Diagnosis not present

## 2021-03-30 DIAGNOSIS — R059 Cough, unspecified: Secondary | ICD-10-CM | POA: Diagnosis not present

## 2021-03-30 DIAGNOSIS — R569 Unspecified convulsions: Secondary | ICD-10-CM | POA: Diagnosis not present

## 2021-03-30 DIAGNOSIS — R0689 Other abnormalities of breathing: Secondary | ICD-10-CM | POA: Diagnosis not present

## 2021-03-30 DIAGNOSIS — R404 Transient alteration of awareness: Secondary | ICD-10-CM | POA: Diagnosis not present

## 2021-03-30 LAB — CBC WITH DIFFERENTIAL/PLATELET
Abs Immature Granulocytes: 0 10*3/uL (ref 0.00–0.07)
Basophils Absolute: 0 10*3/uL (ref 0.0–0.1)
Basophils Relative: 1 %
Eosinophils Absolute: 0 10*3/uL (ref 0.0–0.5)
Eosinophils Relative: 1 %
HCT: 36.3 % — ABNORMAL LOW (ref 39.0–52.0)
Hemoglobin: 12.1 g/dL — ABNORMAL LOW (ref 13.0–17.0)
Immature Granulocytes: 0 %
Lymphocytes Relative: 15 %
Lymphs Abs: 0.6 10*3/uL — ABNORMAL LOW (ref 0.7–4.0)
MCH: 35.8 pg — ABNORMAL HIGH (ref 26.0–34.0)
MCHC: 33.3 g/dL (ref 30.0–36.0)
MCV: 107.4 fL — ABNORMAL HIGH (ref 80.0–100.0)
Monocytes Absolute: 0.3 10*3/uL (ref 0.1–1.0)
Monocytes Relative: 7 %
Neutro Abs: 2.9 10*3/uL (ref 1.7–7.7)
Neutrophils Relative %: 76 %
Platelets: 185 10*3/uL (ref 150–400)
RBC: 3.38 MIL/uL — ABNORMAL LOW (ref 4.22–5.81)
RDW: 13.2 % (ref 11.5–15.5)
WBC: 3.8 10*3/uL — ABNORMAL LOW (ref 4.0–10.5)
nRBC: 0 % (ref 0.0–0.2)

## 2021-03-30 LAB — COMPREHENSIVE METABOLIC PANEL
ALT: 12 U/L (ref 0–44)
AST: 24 U/L (ref 15–41)
Albumin: 3.1 g/dL — ABNORMAL LOW (ref 3.5–5.0)
Alkaline Phosphatase: 68 U/L (ref 38–126)
Anion gap: 5 (ref 5–15)
BUN: 17 mg/dL (ref 8–23)
CO2: 22 mmol/L (ref 22–32)
Calcium: 8.7 mg/dL — ABNORMAL LOW (ref 8.9–10.3)
Chloride: 110 mmol/L (ref 98–111)
Creatinine, Ser: 1.75 mg/dL — ABNORMAL HIGH (ref 0.61–1.24)
GFR, Estimated: 44 mL/min — ABNORMAL LOW (ref >60)
Glucose, Bld: 80 mg/dL (ref 70–99)
Potassium: 4.1 mmol/L (ref 3.5–5.1)
Sodium: 137 mmol/L (ref 135–145)
Total Bilirubin: 0.5 mg/dL (ref 0.3–1.2)
Total Protein: 5.6 g/dL — ABNORMAL LOW (ref 6.5–8.1)

## 2021-03-30 LAB — VITAMIN B12: Vitamin B-12: 234 pg/mL (ref 180–914)

## 2021-03-30 MED ORDER — FOLIC ACID 1 MG PO TABS: 1.0000 mg | ORAL_TABLET | Freq: Every day | ORAL | 3 refills | Status: AC

## 2021-03-30 MED ORDER — LEVETIRACETAM IN NACL 1500 MG/100ML IV SOLN
1500.0000 mg | Freq: Once | INTRAVENOUS | Status: AC
Start: 2021-03-30 — End: 2021-03-30
  Administered 2021-03-30: 1500 mg via INTRAVENOUS
  Filled 2021-03-30: qty 100

## 2021-03-30 MED ORDER — VITAMIN B-12 1000 MCG PO TABS: 1000.0000 ug | ORAL_TABLET | Freq: Every day | ORAL | 3 refills | Status: DC

## 2021-03-30 MED ORDER — LEVETIRACETAM 500 MG PO TABS: 500.0000 mg | ORAL_TABLET | Freq: Two times a day (BID) | ORAL | 3 refills | Status: AC

## 2021-03-30 NOTE — ED Provider Notes (Addendum)
[facility] Provider Note    Event Date/Time   First MD Initiated Contact with Patient 03/30/21 1030     (approximate)   History   Seizures   HPI  Brian Marquez is a 61 y.o. male who comes from a group home after having had a seizure there which was tonic-clonic and a more reportedly focal seizure with EMS.  Patient is awake alert and playing with that sure and will follow some commands but not most.  He will not speak.  We are calling the group home to see if this is something new or not.  Group home told EMS that he was taken off Keppra and put on meloxicam about a month ago.  I cannot find anything in care everywhere or chart review to explain this.  Patient has HIV as well patient has HIV dementia and progressive multifocal leukoencephalopathy and alcoholism and history of substance abuse.      Physical Exam   Triage Vital Signs: ED Triage Vitals  Enc Vitals Group     BP 03/30/21 1019 101/67     Pulse Rate 03/30/21 1019 61     Resp 03/30/21 1019 (!) 21     Temp 03/30/21 1019 98.2 F (36.8 C)     Temp Source 03/30/21 1019 Oral     SpO2 03/30/21 1019 96 %     Weight 03/30/21 1022 135 lb (61.2 kg)     Height 03/30/21 1022 6' (1.829 m)     Head Circumference --      Peak Flow --      Pain Score --      Pain Loc --      Pain Edu? --      Excl. in GC? --     Most recent vital signs: Vitals:   03/30/21 1019 03/30/21 1130  BP: 101/67 108/61  Pulse: 61 (!) 56  Resp: (!) 21 19  Temp: 98.2 F (36.8 C)   SpO2: 96% 95%     General: Awake, no distress.  CV:  Good peripheral perfusion.  Resp:  Normal effort.  Lungs are clear Abd:  No distention.  Extremities no edema Neuro patient moving extremities equally and well but will not speak and does not want to follow commands currently. Rectal: No masses prostate normal Hemoccult negative  ED Results / Procedures / Treatments   Labs (all labs ordered are listed, but only abnormal results are  displayed) Labs Reviewed  COMPREHENSIVE METABOLIC PANEL - Abnormal; Notable for the following components:      Result Value   Creatinine, Ser 1.75 (*)    Calcium 8.7 (*)    Total Protein 5.6 (*)    Albumin 3.1 (*)    GFR, Estimated 44 (*)    All other components within normal limits  CBC WITH DIFFERENTIAL/PLATELET - Abnormal; Notable for the following components:   WBC 3.8 (*)    RBC 3.38 (*)    Hemoglobin 12.1 (*)    HCT 36.3 (*)    MCV 107.4 (*)    MCH 35.8 (*)    Lymphs Abs 0.6 (*)    All other components within normal limits     EKG     RADIOLOGY    PROCEDURES:  Critical Care performed:   Procedures   MEDICATIONS ORDERED IN ED: Medications  levETIRAcetam (KEPPRA) IVPB 1500 mg/ 100 mL premix (0 mg Intravenous Stopped 03/30/21 1131)     IMPRESSION / MDM / ASSESSMENT AND PLAN /  ED COURSE  I reviewed the triage vital signs and the nursing notes. ----------------------------------------- 11:45 AM on 03/30/2021 ----------------------------------------- Nurses called the care facility.  Patient is usually walking and talking.  Patient was taken off of Keppra and put on meloxicam last month.  Not sure why this is.  Patient has a prescription for Keppra from last month in the records.  I can find no record of him being taken off of Keppra or any reason for him to be taken off of Keppra.  He does not know and cannot tell me.  At this time patient is now awake alert and speaking and acting normally and following commands.  I think he was just postictal before.  He does have a history of seizures.  I will have to start him on something and I think I will keep him on his Keppra for now.  I have asked the nursing facility to contact his doctor to see if there is any reason to to switch him to something else. Patient is not coughing any longer.  He is not febrile I will cancel the x-ray  The patient is on the cardiac monitor to evaluate for evidence of arrhythmia and/or  significant heart rate changes.      FINAL CLINICAL IMPRESSION(S) / ED DIAGNOSES   Final diagnoses:  Seizure ([place])     Rx / DC Orders   ED Discharge Orders          Ordered    levETIRAcetam (KEPPRA) 500 MG tablet  2 times daily        03/30/21 1145             Note:  This document was prepared using Dragon voice recognition software and may include unintentional dictation errors.   Nena Polio, MD 03/30/21 1147    Nena Polio, MD 03/30/21 1159

## 2021-03-30 NOTE — Discharge Instructions (Addendum)
Folate folate folate patient had a seizure.  I am not sure why he was taken off his Keppra and switch to meloxicam, which of course is not a seizure medication.  I am putting him back on Keppra.  If you can double check with his doctor that would be perfect.  He has a history of seizures and will need to be on some antiepileptic but I can see no evidence of meloxicam causing any problems in the records that are available to me.  Patient has a macrocytic anemia.  I am going to give him prescriptions for B12 and folate by mouth.  Please have his doctor follow-up on this.  Please report if he has any bloody or black tarry stools and send him back here if he does.  Patient appears to have a macrocytic anemia as well.  I am going to prescribe him folate and B12.

## 2021-03-30 NOTE — ED Triage Notes (Signed)
Pt BIB EMS from [facility] group home c/o seizure. Caregiver witnessed tonic/clonic x 1 minute. Was on seizure meds and was taken off 1 month ago, unknown reason; pt started on Meloxicam instead. EMS witnessed focal seizure, Versed 2mg  IM, Versed 2mg  IV, NS 500 given by EMS. H/O Afib and dementia. Pt opens eyes, opened mouth for Temp check.   98/50 60s CBG 125

## 2021-03-30 NOTE — ED Notes (Signed)
Pt now more awake, oriented to self and time only. Denies any complaints at this time.

## 2021-03-30 NOTE — ED Notes (Signed)
Dr. Darnelle Catalan did a POC Occult blood. Per MD, test is negative, good to discharge.

## 2021-03-30 NOTE — ED Notes (Signed)
Pt dcd back to group home. This RN requested Diplomatic Services operational officer to call for ambulance to transport pt, per Secretary, EMS refused to pick up pt and Cab transport was arranged instead. Okayed by charge nurse Marijean Niemann.

## 2021-03-31 LAB — FOLATE RBC
Folate, Hemolysate: 341 ng/mL
Folate, RBC: 844 ng/mL (ref >498)
Hematocrit: 40.4 % (ref 37.5–51.0)

## 2021-04-12 DIAGNOSIS — G40909 Epilepsy, unspecified, not intractable, without status epilepticus: Secondary | ICD-10-CM | POA: Diagnosis not present

## 2021-04-12 DIAGNOSIS — I10 Essential (primary) hypertension: Secondary | ICD-10-CM | POA: Diagnosis not present

## 2021-04-12 DIAGNOSIS — G2581 Restless legs syndrome: Secondary | ICD-10-CM | POA: Diagnosis not present

## 2021-04-12 DIAGNOSIS — D509 Iron deficiency anemia, unspecified: Secondary | ICD-10-CM | POA: Diagnosis not present

## 2021-05-10 DIAGNOSIS — M19079 Primary osteoarthritis, unspecified ankle and foot: Secondary | ICD-10-CM | POA: Diagnosis not present

## 2021-05-10 DIAGNOSIS — L299 Pruritus, unspecified: Secondary | ICD-10-CM | POA: Diagnosis not present

## 2021-05-10 DIAGNOSIS — I10 Essential (primary) hypertension: Secondary | ICD-10-CM | POA: Diagnosis not present

## 2021-05-10 DIAGNOSIS — N1832 Chronic kidney disease, stage 3b: Secondary | ICD-10-CM | POA: Diagnosis not present

## 2021-06-01 ENCOUNTER — Encounter: Payer: Self-pay | Admitting: Infectious Disease

## 2021-06-01 ENCOUNTER — Other Ambulatory Visit: Payer: Self-pay

## 2021-06-01 ENCOUNTER — Ambulatory Visit (INDEPENDENT_AMBULATORY_CARE_PROVIDER_SITE_OTHER): Payer: Medicare Other | Admitting: Infectious Disease

## 2021-06-01 VITALS — BP 120/74 | HR 49 | Temp 97.9°F | Wt 122.4 lb

## 2021-06-01 DIAGNOSIS — G909 Disorder of the autonomic nervous system, unspecified: Secondary | ICD-10-CM

## 2021-06-01 DIAGNOSIS — F1020 Alcohol dependence, uncomplicated: Secondary | ICD-10-CM

## 2021-06-01 DIAGNOSIS — K089 Disorder of teeth and supporting structures, unspecified: Secondary | ICD-10-CM | POA: Diagnosis not present

## 2021-06-01 DIAGNOSIS — A812 Progressive multifocal leukoencephalopathy: Secondary | ICD-10-CM

## 2021-06-01 DIAGNOSIS — E782 Mixed hyperlipidemia: Secondary | ICD-10-CM

## 2021-06-01 DIAGNOSIS — I214 Non-ST elevation (NSTEMI) myocardial infarction: Secondary | ICD-10-CM

## 2021-06-01 DIAGNOSIS — R569 Unspecified convulsions: Secondary | ICD-10-CM

## 2021-06-01 DIAGNOSIS — B20 Human immunodeficiency virus [HIV] disease: Secondary | ICD-10-CM | POA: Diagnosis not present

## 2021-06-01 DIAGNOSIS — F17200 Nicotine dependence, unspecified, uncomplicated: Secondary | ICD-10-CM | POA: Diagnosis not present

## 2021-06-01 MED ORDER — BIKTARVY 50-200-25 MG PO TABS: 1.0000 | ORAL_TABLET | Freq: Every day | ORAL | 11 refills | Status: DC

## 2021-06-01 NOTE — Progress Notes (Signed)
Chief complaint: Follow-up for HIV disease ? ?Subjective:  ? ? Patient ID: Brian Marquez, male    DOB: [birth year], 61 y.o.   MRN: [id number] ? ?HIV Positive/AIDS ? ?61 year old Caucasian man with HIV/AIDS, prior polysubstance abuse in the past HIV encephalopathy still living in ALF facility. Phone number is  ?[phone]. He has been compliant with  to [facility].  ? ?Cheney does have comorbid coronary artery disease, HIV dementia smoking. ? He had RPR + to 1:4 and we gave him bicillin x 1. ? ?Been diagnosed with a seizure disorder at [facility] and placed on [place]. ? ?His viral load has been reasonably suppressed though not less than 20 were last checked. ? ?BIKTARVY is being filled regularly and is taking the medication regularly according to him staff from the assisted living. ? 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 ? ?He does continue to smoke. ? ?He is in good spirits and without any specific complaints today. ? ? ? ? ? ? ? ?Past Medical History:  ?Diagnosis Date  ? Abscess of buttock, right 10/17/2019  ? Alcohol abuse 03/31/2015  ? Alcoholism ([place])   ? Alcoholism ([place]) 08/18/2014  ? Coma ([place])   ? COPD, moderate ([place]) 11/25/2014  ? Encephalopathy   ? 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 HIV infection ([place])   ? 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 Hyperlipidemia 12/01/2020  ? Seizure ([place]) 12/01/2020  ? ? ?No past surgical history on file. ? ?No family history on file. ? ?  ?Social History  ? ?Socioeconomic History  ? Marital status: Single  ?  Spouse name: Not on file  ? Number of children: Not on file  ? Years of education: Not on file  ? Highest education level: Not on file  ?Occupational History  ? Not on file  ?Tobacco Use  ? Smoking status: Every Day  ?  Packs/day: 0.50  ?  Types: Cigarettes  ? Smokeless tobacco: Never  ?Substance and Sexual Activity  ? Alcohol use: Yes  ?  Alcohol/week: 0.0 standard drinks  ?  Comment: Occassional,  40oz when he does drink  ? Drug use: No  ? Sexual activity: Never  ?  Partners: Female  ?  Comment: declined condoms  ?Other Topics Concern  ? Not on file  ?Social History Narrative  ? Not on file  ? ?Social Determinants of Health  ? ?Financial Resource Strain: Not on file  ?Food Insecurity: Not on file  ?Transportation Needs: Not on file  ?Physical Activity: Not on file  ?Stress: Not on file  ?Social Connections: Not on file  ? ? ?No Known Allergies ? ? ?Current Outpatient Medications:  ?  atorvastatin (LIPITOR) 40 MG tablet, TAKE 1 TABLET BY MOUTH ONCE DAILY IN THE EVENING, Disp: 30 tablet, Rfl: 0 ?  bictegravir-emtricitabine-tenofovir AF (BIKTARVY) 50-200-25 MG TABS tablet, Take 1 tablet by mouth daily., Disp: 30 tablet, Rfl: 11 ?  fexofenadine (ALLEGRA) 180 MG tablet, TAKE 1 TABLET BY MOUTH ONCE DAILY., Disp: 30 tablet, Rfl: 5 ?  folic acid (FOLVITE) 1 MG tablet, Take 1 tablet (1 mg total) by mouth daily., Disp: 30 tablet, Rfl: 3 ?  gemfibrozil (LOPID) 600 MG tablet, Take 600 mg by mouth 2 (two) times daily., Disp: , Rfl:  ?  GNP ASPIRIN LOW DOSE 81 MG EC tablet, TAKE 1 TABLET BY MOUTH ONCE DAILY., Disp: 30 tablet, Rfl: 0 ?  hydrOXYzine (  VISTARIL) 25 MG capsule, Take 25 mg by mouth 2 (two) times daily., Disp: , Rfl:  ?  ibuprofen (ADVIL) 800 MG tablet, Take 800 mg by mouth 2 (two) times daily as needed., Disp: , Rfl:  ?  levETIRAcetam (KEPPRA) 500 MG tablet, Take by mouth., Disp: , Rfl:  ?  levETIRAcetam (KEPPRA) 500 MG tablet, Take 1 tablet (500 mg total) by mouth 2 (two) times daily., Disp: 60 tablet, Rfl: 3 ?  metoprolol tartrate (LOPRESSOR) 25 MG tablet, Take 25 mg by mouth daily., Disp: , Rfl:  ?  traMADol (ULTRAM) 50 MG tablet, Take 50 mg by mouth 2 (two) times daily as needed., Disp: , Rfl:  ?  triamcinolone cream (KENALOG) 0.5 %, APPLY AS DIRECTED TO AFFECTED AREA TWICE DAILY AS NEEDED *MAY SELF APPLY AT BEDTIME*, Disp: 30 g, Rfl: 0 ?  vitamin B-12 (CYANOCOBALAMIN) 1000 MCG tablet, Take 1 tablet (1,000  mcg total) by mouth daily., Disp: 30 tablet, Rfl: 3 ?  disulfiram (ANTABUSE) 250 MG tablet, Take 500 mg by mouth daily., Disp: , Rfl:  ?  Fluticasone-Salmeterol (ADVAIR) 250-50 MCG/DOSE AEPB, Inhale 1 puff into the lungs every 12 (twelve) hours., Disp: , Rfl:  ?  gabapentin (NEURONTIN) 300 MG capsule, TAKE 1 CAPSULE BY MOUTH AT BEDTIME (Patient not taking: No sig reported), Disp: 30 capsule, Rfl: 5 ?  mirtazapine (REMERON) 7.5 MG tablet, TAKE ONE TABLET BY MOUTH AT BEDTIME. (Patient not taking: No sig reported), Disp: 30 tablet, Rfl: 5 ?  nitroGLYCERIN (NITROSTAT) 0.4 MG SL tablet, Place 1 tablet (0.4 mg total) under the tongue every 5 (five) minutes x 3 doses as needed for chest pain. (Patient not taking: Reported on 03/20/2019), Disp: 30 tablet, Rfl: 0 ? ? ? ?Review of Systems  ?Unable to perform ROS: Dementia  ? ?   ?Objective:  ? Physical Exam ?Constitutional:   ?   Appearance: He is well-developed.  ?HENT:  ?   Head: Normocephalic and atraumatic.  ?Eyes:  ?   Conjunctiva/sclera: Conjunctivae normal.  ?Cardiovascular:  ?   Rate and Rhythm: Normal rate and regular rhythm.  ?Pulmonary:  ?   Effort: Pulmonary effort is normal. No respiratory distress.  ?   Breath sounds: No wheezing.  ?Abdominal:  ?   General: There is no distension.  ?   Palpations: Abdomen is soft.  ?Musculoskeletal:     ?   General: No tenderness. Normal range of motion.  ?   Cervical back: Normal range of motion and neck supple.  ?Skin: ?   General: Skin is warm and dry.  ?   Coloration: Skin is not pale.  ?   Findings: No erythema or rash.  ?Neurological:  ?   General: No focal deficit present.  ?   Mental Status: He is alert and oriented to person, place, and time.  ?Psychiatric:     ?   Attention and Perception: Attention normal.     ?   Mood and Affect: Mood and affect normal.     ?   Speech: Speech normal.     ?   Behavior: Behavior normal.     ?   Cognition and Memory: He exhibits impaired remote memory.  ? ? ? ?   ?Assessment & Plan:   ?HIV disease: ? ?I am checking a viral load CD4 count CBC CMP ? ?Continue his BIKTARVY prescription he can come back to see [place] in 6 months time. ? ?HIV dementia: Does not have the capacity to make  independent decisions done much better after being in assisted living now for nearly 9 years. ? ?Seizure disorder we will continue on Keppra ?Coronary disease continue on aspirin 81 mg Lipitor as well as metoprolol ?It would be very helpful he could stop smoking. ? ?Smoking: Still would be great if he could stop smoking but this has been a difficult task for him. ? ?Insomnia continue Remeron. ? ?Neuropathy continue gabapentin. ? ?Hyperlipidemia he is on Lipitor and gemfibrozil ? ?Alcoholism: He is minimizing how much this is happening now. ? ? ? ?

## 2021-06-02 LAB — T-HELPER CELLS (CD4) COUNT (NOT AT ARMC)
CD4 % Helper T Cell: 32 % — ABNORMAL LOW (ref 33–65)
CD4 T Cell Abs: 399 /uL — ABNORMAL LOW (ref 400–1790)

## 2021-06-03 LAB — CBC WITH DIFFERENTIAL/PLATELET
Absolute Monocytes: 384 cells/uL (ref 200–950)
Basophils Absolute: 49 cells/uL (ref 0–200)
Basophils Relative: 0.8 %
Eosinophils Absolute: 122 cells/uL (ref 15–500)
Eosinophils Relative: 2 %
HCT: 34 % — ABNORMAL LOW (ref 38.5–50.0)
Hemoglobin: 11.8 g/dL — ABNORMAL LOW (ref 13.2–17.1)
Lymphs Abs: 1305 cells/uL (ref 850–3900)
MCH: 35.9 pg — ABNORMAL HIGH (ref 27.0–33.0)
MCHC: 34.7 g/dL (ref 32.0–36.0)
MCV: 103.3 fL — ABNORMAL HIGH (ref 80.0–100.0)
MPV: 9.5 fL (ref 7.5–12.5)
Monocytes Relative: 6.3 %
Neutro Abs: 4240 cells/uL (ref 1500–7800)
Neutrophils Relative %: 69.5 %
Platelets: 229 10*3/uL (ref 140–400)
RBC: 3.29 10*6/uL — ABNORMAL LOW (ref 4.20–5.80)
RDW: 13.3 % (ref 11.0–15.0)
Total Lymphocyte: 21.4 %
WBC: 6.1 10*3/uL (ref 3.8–10.8)

## 2021-06-03 LAB — COMPLETE METABOLIC PANEL WITH GFR
AG Ratio: 1.7 (calc) (ref 1.0–2.5)
ALT: 6 U/L — ABNORMAL LOW (ref 9–46)
AST: 16 U/L (ref 10–35)
Albumin: 3.8 g/dL (ref 3.6–5.1)
Alkaline phosphatase (APISO): 70 U/L (ref 35–144)
BUN/Creatinine Ratio: 13 (calc) (ref 6–22)
BUN: 24 mg/dL (ref 7–25)
CO2: 26 mmol/L (ref 20–32)
Calcium: 9.3 mg/dL (ref 8.6–10.3)
Chloride: 109 mmol/L (ref 98–110)
Creat: 1.8 mg/dL — ABNORMAL HIGH (ref 0.70–1.35)
Globulin: 2.2 g/dL (calc) (ref 1.9–3.7)
Glucose, Bld: 95 mg/dL (ref 65–99)
Potassium: 5.3 mmol/L (ref 3.5–5.3)
Sodium: 140 mmol/L (ref 135–146)
Total Bilirubin: 0.3 mg/dL (ref 0.2–1.2)
Total Protein: 6 g/dL — ABNORMAL LOW (ref 6.1–8.1)
eGFR: 42 mL/min/{1.73_m2} — ABNORMAL LOW (ref >=60)

## 2021-06-03 LAB — HIV-1 RNA QUANT-NO REFLEX-BLD
HIV 1 RNA Quant: NOT DETECTED Copies/mL
HIV-1 RNA Quant, Log: NOT DETECTED Log cps/mL

## 2021-06-03 LAB — LIPID PANEL
Cholesterol: 106 mg/dL (ref <200)
HDL: 49 mg/dL (ref >=40)
LDL Cholesterol (Calc): 41 mg/dL (calc)
Non-HDL Cholesterol (Calc): 57 mg/dL (calc) (ref <130)
Total CHOL/HDL Ratio: 2.2 (calc) (ref <5.0)
Triglycerides: 78 mg/dL (ref <150)

## 2021-06-03 LAB — RPR: RPR Ser Ql: NONREACTIVE

## 2021-06-08 ENCOUNTER — Other Ambulatory Visit: Payer: Self-pay

## 2021-06-08 DIAGNOSIS — I214 Non-ST elevation (NSTEMI) myocardial infarction: Secondary | ICD-10-CM

## 2021-06-08 MED ORDER — ATORVASTATIN CALCIUM 40 MG PO TABS: 40.0000 mg | ORAL_TABLET | Freq: Every evening | ORAL | 2 refills | Status: DC

## 2021-10-25 DIAGNOSIS — I10 Essential (primary) hypertension: Secondary | ICD-10-CM | POA: Diagnosis not present

## 2021-10-25 DIAGNOSIS — J309 Allergic rhinitis, unspecified: Secondary | ICD-10-CM | POA: Diagnosis not present

## 2021-10-25 DIAGNOSIS — G40909 Epilepsy, unspecified, not intractable, without status epilepticus: Secondary | ICD-10-CM | POA: Diagnosis not present

## 2021-10-25 DIAGNOSIS — G64 Other disorders of peripheral nervous system: Secondary | ICD-10-CM | POA: Diagnosis not present

## 2021-11-22 DIAGNOSIS — G40909 Epilepsy, unspecified, not intractable, without status epilepticus: Secondary | ICD-10-CM | POA: Diagnosis not present

## 2021-11-22 DIAGNOSIS — I10 Essential (primary) hypertension: Secondary | ICD-10-CM | POA: Diagnosis not present

## 2021-11-22 DIAGNOSIS — R630 Anorexia: Secondary | ICD-10-CM | POA: Diagnosis not present

## 2021-11-22 DIAGNOSIS — J309 Allergic rhinitis, unspecified: Secondary | ICD-10-CM | POA: Diagnosis not present

## 2021-12-07 ENCOUNTER — Ambulatory Visit (INDEPENDENT_AMBULATORY_CARE_PROVIDER_SITE_OTHER): Payer: Medicare Other | Admitting: Infectious Disease

## 2021-12-07 ENCOUNTER — Other Ambulatory Visit: Payer: Self-pay

## 2021-12-07 ENCOUNTER — Encounter: Payer: Self-pay | Admitting: Infectious Disease

## 2021-12-07 VITALS — BP 146/78 | HR 68 | Temp 97.9°F | Wt 123.0 lb

## 2021-12-07 DIAGNOSIS — F1020 Alcohol dependence, uncomplicated: Secondary | ICD-10-CM | POA: Diagnosis not present

## 2021-12-07 DIAGNOSIS — K089 Disorder of teeth and supporting structures, unspecified: Secondary | ICD-10-CM

## 2021-12-07 DIAGNOSIS — A812 Progressive multifocal leukoencephalopathy: Secondary | ICD-10-CM | POA: Diagnosis not present

## 2021-12-07 DIAGNOSIS — F17200 Nicotine dependence, unspecified, uncomplicated: Secondary | ICD-10-CM

## 2021-12-07 DIAGNOSIS — I214 Non-ST elevation (NSTEMI) myocardial infarction: Secondary | ICD-10-CM

## 2021-12-07 DIAGNOSIS — B20 Human immunodeficiency virus [HIV] disease: Secondary | ICD-10-CM | POA: Diagnosis not present

## 2021-12-07 DIAGNOSIS — R569 Unspecified convulsions: Secondary | ICD-10-CM | POA: Diagnosis not present

## 2021-12-07 DIAGNOSIS — G909 Disorder of the autonomic nervous system, unspecified: Secondary | ICD-10-CM

## 2021-12-07 MED ORDER — BIKTARVY 50-200-25 MG PO TABS: 1.0000 | ORAL_TABLET | Freq: Every day | ORAL | 11 refills | Status: DC

## 2021-12-07 MED ORDER — ATORVASTATIN CALCIUM 40 MG PO TABS: 40.0000 mg | ORAL_TABLET | Freq: Every evening | ORAL | 3 refills | Status: DC

## 2021-12-07 NOTE — Addendum Note (Signed)
Addended by: Caffie Pinto on: 12/07/2021 12:05 PM   Modules accepted: Orders

## 2021-12-07 NOTE — Progress Notes (Signed)
Chief complaint: Follow-up for HIV disease on medications with some ankle pain Subjective:    Patient ID: Brian Marquez, male    DOB: Nov 28, 1960, 61 y.o.   MRN: [id number]  HIV Positive/AIDS   61 year old Caucasian man with HIV/AIDS, prior polysubstance abuse in the past HIV encephalopathy still living in ALF facility. Phone number is  [phone]. He has been compliant with  to [facility].   Xadrian does have comorbid coronary artery disease, HIV dementia smoking.  He had RPR + to 1:4 and we gave him bicillin x 1.  Been diagnosed with a seizure disorder at [facility] and placed on [place].  His viral load has been reasonably suppressed though not less than 20 were last checked.  BIKTARVY is being filled regularly and is taking the medication regularly according to him staff from the assisted living.  Since I last saw Amadi he has been taken off his metoprolol by his primary care physician apparently his blood pressure was "too low".  We reviewed labs and his creatinine had gone up to 1.8 which is still within the range of where it has been but certainly has been worsening over the last 5 years.  He has been suffering from some pain in his ankle for which she was prescribed capsaicin cream which he dislikes because is too hot  He is continuing to smoke.     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  No family history on file.    Social History   Socioeconomic History   Marital status: Single    Spouse name: Not on file   Number of children: Not on file   Years of education: Not on file    Highest education level: Not on file  Occupational History   Not on file  Tobacco Use   Smoking status: Every Day    Packs/day: 0.50    Types: Cigarettes   Smokeless tobacco: Never  Substance and Sexual Activity   Alcohol use: Yes    Alcohol/week: 0.0 standard drinks of alcohol    Comment: Occassional, 40oz when he does drink   Drug use: No   Sexual activity: Never    Partners: Female    Comment: declined condoms  Other Topics Concern   Not on file  Social History Narrative   Not on file   Social Determinants of Health   Financial Resource Strain: Not on file  Food Insecurity: Not on file  Transportation Needs: Not on file  Physical Activity: Not on file  Stress: Not on file  Social Connections: Not on file    No Known Allergies   Current Outpatient Medications:    atorvastatin (LIPITOR) 40 MG tablet, Take 1 tablet (40 mg total) by mouth every evening., Disp: 90 tablet, Rfl: 2   bictegravir-emtricitabine-tenofovir AF (BIKTARVY) 50-200-25 MG TABS tablet, Take 1 tablet by mouth daily., Disp: 30 tablet, Rfl: 11   disulfiram (ANTABUSE) 250 MG tablet, Take 500 mg by mouth daily., Disp: , Rfl:    fexofenadine (ALLEGRA) 180 MG tablet, TAKE 1 TABLET  BY MOUTH ONCE DAILY., Disp: 30 tablet, Rfl: 5   Fluticasone-Salmeterol (ADVAIR) 250-50 MCG/DOSE AEPB, Inhale 1 puff into the lungs every 12 (twelve) hours., Disp: , Rfl:    folic acid (FOLVITE) 1 MG tablet, Take 1 tablet (1 mg total) by mouth daily., Disp: 30 tablet, Rfl: 3   gabapentin (NEURONTIN) 300 MG capsule, TAKE 1 CAPSULE BY MOUTH AT BEDTIME (Patient not taking: No sig reported), Disp: 30 capsule, Rfl: 5   gemfibrozil (LOPID) 600 MG tablet, Take 600 mg by mouth 2 (two) times daily., Disp: , Rfl:    GNP ASPIRIN LOW DOSE 81 MG EC tablet, TAKE 1 TABLET BY MOUTH ONCE DAILY., Disp: 30 tablet, Rfl: 0   hydrOXYzine (VISTARIL) 25 MG capsule, Take 25 mg by mouth 2 (two) times daily., Disp: , Rfl:    ibuprofen (ADVIL) 800 MG tablet,  Take 800 mg by mouth 2 (two) times daily as needed., Disp: , Rfl:    levETIRAcetam (KEPPRA) 500 MG tablet, Take by mouth., Disp: , Rfl:    levETIRAcetam (KEPPRA) 500 MG tablet, Take 1 tablet (500 mg total) by mouth 2 (two) times daily., Disp: 60 tablet, Rfl: 3   metoprolol tartrate (LOPRESSOR) 25 MG tablet, Take 25 mg by mouth daily., Disp: , Rfl:    mirtazapine (REMERON) 7.5 MG tablet, TAKE ONE TABLET BY MOUTH AT BEDTIME. (Patient not taking: No sig reported), Disp: 30 tablet, Rfl: 5   nitroGLYCERIN (NITROSTAT) 0.4 MG SL tablet, Place 1 tablet (0.4 mg total) under the tongue every 5 (five) minutes x 3 doses as needed for chest pain. (Patient not taking: Reported on 03/20/2019), Disp: 30 tablet, Rfl: 0   traMADol (ULTRAM) 50 MG tablet, Take 50 mg by mouth 2 (two) times daily as needed., Disp: , Rfl:    triamcinolone cream (KENALOG) 0.5 %, APPLY AS DIRECTED TO AFFECTED AREA TWICE DAILY AS NEEDED *MAY SELF APPLY AT BEDTIME*, Disp: 30 g, Rfl: 0   vitamin B-12 (CYANOCOBALAMIN) 1000 MCG tablet, Take 1 tablet (1,000 mcg total) by mouth daily., Disp: 30 tablet, Rfl: 3    Review of Systems  Unable to perform ROS: Dementia       Objective:   Physical Exam Constitutional:      Appearance: He is well-developed.  HENT:     Head: Normocephalic and atraumatic.  Eyes:     Conjunctiva/sclera: Conjunctivae normal.  Cardiovascular:     Rate and Rhythm: Normal rate and regular rhythm.  Pulmonary:     Effort: Pulmonary effort is normal. No respiratory distress.     Breath sounds: No wheezing.  Abdominal:     General: There is no distension.     Palpations: Abdomen is soft.  Musculoskeletal:        General: No tenderness. Normal range of motion.     Cervical back: Normal range of motion and neck supple.     Right ankle: Normal. No swelling, deformity, ecchymosis or lacerations. Normal range of motion.  Skin:    General: Skin is warm and dry.     Coloration: Skin is not pale.     Findings: No  erythema or rash.  Neurological:     Mental Status: He is alert and oriented to person, place, and time.  Psychiatric:        Attention and Perception: Attention normal.        Mood and Affect: Mood normal.        Speech: Speech is delayed.        Behavior:  Behavior normal.        Thought Content: Thought content normal.         Assessment & Plan:  HIV disease:  I will add order HIV viral load CD4 count CBC with differential CMP, RPR GC and chlamydia and I will continue  [employer], prescription  Chronic kidney disease: Creatinine has been gradually worsening over time I will recheck a CMP today and if it is still getting worse I will have a low threshold to refer to nephrology.  Hyperlipidemia I am checking a lipid panel today and continue his Lipitor prescription  Seizure disorder he will be continued on Keppra.  Coronary disease he is on Lipitor aspirin but no longer on metoprolol  Smoking: Again counseled him regarding smoking cessation vaccine counseling recommended he received flu shot which he did not want today also recommended he get an RSV vaccine as well as an updated COVID booster.  Ankle pain: seems to be skill skeletal issue.  May benefit from physical therapy

## 2021-12-08 LAB — T-HELPER CELLS (CD4) COUNT (NOT AT ARMC)
CD4 % Helper T Cell: 38 % (ref 33–65)
CD4 T Cell Abs: 375 /uL — ABNORMAL LOW (ref 400–1790)

## 2021-12-10 LAB — COMPLETE METABOLIC PANEL WITH GFR
AG Ratio: 1.8 (calc) (ref 1.0–2.5)
ALT: 10 U/L (ref 9–46)
AST: 15 U/L (ref 10–35)
Albumin: 4.2 g/dL (ref 3.6–5.1)
Alkaline phosphatase (APISO): 111 U/L (ref 35–144)
BUN/Creatinine Ratio: 9 (calc) (ref 6–22)
BUN: 15 mg/dL (ref 7–25)
CO2: 24 mmol/L (ref 20–32)
Calcium: 9.4 mg/dL (ref 8.6–10.3)
Chloride: 109 mmol/L (ref 98–110)
Creat: 1.63 mg/dL — ABNORMAL HIGH (ref 0.70–1.35)
Globulin: 2.3 g/dL (calc) (ref 1.9–3.7)
Glucose, Bld: 104 mg/dL — ABNORMAL HIGH (ref 65–99)
Potassium: 4.8 mmol/L (ref 3.5–5.3)
Sodium: 140 mmol/L (ref 135–146)
Total Bilirubin: 0.2 mg/dL (ref 0.2–1.2)
Total Protein: 6.5 g/dL (ref 6.1–8.1)
eGFR: 48 mL/min/{1.73_m2} — ABNORMAL LOW (ref >=60)

## 2021-12-10 LAB — LIPID PANEL
Cholesterol: 118 mg/dL (ref <200)
HDL: 55 mg/dL (ref >=40)
LDL Cholesterol (Calc): 47 mg/dL (calc)
Non-HDL Cholesterol (Calc): 63 mg/dL (calc) (ref <130)
Total CHOL/HDL Ratio: 2.1 (calc) (ref <5.0)
Triglycerides: 79 mg/dL (ref <150)

## 2021-12-10 LAB — CBC WITH DIFFERENTIAL/PLATELET
Absolute Monocytes: 350 cells/uL (ref 200–950)
Basophils Absolute: 48 cells/uL (ref 0–200)
Basophils Relative: 1 %
Eosinophils Absolute: 82 cells/uL (ref 15–500)
Eosinophils Relative: 1.7 %
HCT: 35.9 % — ABNORMAL LOW (ref 38.5–50.0)
Hemoglobin: 12.5 g/dL — ABNORMAL LOW (ref 13.2–17.1)
Lymphs Abs: 1018 cells/uL (ref 850–3900)
MCH: 34.5 pg — ABNORMAL HIGH (ref 27.0–33.0)
MCHC: 34.8 g/dL (ref 32.0–36.0)
MCV: 99.2 fL (ref 80.0–100.0)
MPV: 9.6 fL (ref 7.5–12.5)
Monocytes Relative: 7.3 %
Neutro Abs: 3302 cells/uL (ref 1500–7800)
Neutrophils Relative %: 68.8 %
Platelets: 228 10*3/uL (ref 140–400)
RBC: 3.62 10*6/uL — ABNORMAL LOW (ref 4.20–5.80)
RDW: 13.8 % (ref 11.0–15.0)
Total Lymphocyte: 21.2 %
WBC: 4.8 10*3/uL (ref 3.8–10.8)

## 2021-12-10 LAB — HIV-1 RNA QUANT-NO REFLEX-BLD
HIV 1 RNA Quant: 26 Copies/mL — ABNORMAL HIGH
HIV-1 RNA Quant, Log: 1.41 Log cps/mL — ABNORMAL HIGH

## 2021-12-10 LAB — RPR: RPR Ser Ql: NONREACTIVE

## 2021-12-20 DIAGNOSIS — G40909 Epilepsy, unspecified, not intractable, without status epilepticus: Secondary | ICD-10-CM | POA: Diagnosis not present

## 2021-12-20 DIAGNOSIS — G2581 Restless legs syndrome: Secondary | ICD-10-CM | POA: Diagnosis not present

## 2021-12-20 DIAGNOSIS — I10 Essential (primary) hypertension: Secondary | ICD-10-CM | POA: Diagnosis not present

## 2021-12-20 DIAGNOSIS — R630 Anorexia: Secondary | ICD-10-CM | POA: Diagnosis not present

## 2022-01-17 DIAGNOSIS — G40909 Epilepsy, unspecified, not intractable, without status epilepticus: Secondary | ICD-10-CM | POA: Diagnosis not present

## 2022-01-17 DIAGNOSIS — I10 Essential (primary) hypertension: Secondary | ICD-10-CM | POA: Diagnosis not present

## 2022-01-17 DIAGNOSIS — E785 Hyperlipidemia, unspecified: Secondary | ICD-10-CM | POA: Diagnosis not present

## 2022-01-17 DIAGNOSIS — N1832 Chronic kidney disease, stage 3b: Secondary | ICD-10-CM | POA: Diagnosis not present

## 2022-01-27 DIAGNOSIS — Z79899 Other long term (current) drug therapy: Secondary | ICD-10-CM | POA: Diagnosis not present

## 2022-01-27 DIAGNOSIS — E559 Vitamin D deficiency, unspecified: Secondary | ICD-10-CM | POA: Diagnosis not present

## 2022-01-27 DIAGNOSIS — E119 Type 2 diabetes mellitus without complications: Secondary | ICD-10-CM | POA: Diagnosis not present

## 2022-03-14 DIAGNOSIS — E785 Hyperlipidemia, unspecified: Secondary | ICD-10-CM | POA: Diagnosis not present

## 2022-03-14 DIAGNOSIS — N189 Chronic kidney disease, unspecified: Secondary | ICD-10-CM | POA: Diagnosis not present

## 2022-03-14 DIAGNOSIS — D649 Anemia, unspecified: Secondary | ICD-10-CM | POA: Diagnosis not present

## 2022-03-14 DIAGNOSIS — I10 Essential (primary) hypertension: Secondary | ICD-10-CM | POA: Diagnosis not present

## 2022-04-11 DIAGNOSIS — I10 Essential (primary) hypertension: Secondary | ICD-10-CM | POA: Diagnosis not present

## 2022-04-11 DIAGNOSIS — R110 Nausea: Secondary | ICD-10-CM | POA: Diagnosis not present

## 2022-04-11 DIAGNOSIS — G2581 Restless legs syndrome: Secondary | ICD-10-CM | POA: Diagnosis not present

## 2022-04-11 DIAGNOSIS — G40909 Epilepsy, unspecified, not intractable, without status epilepticus: Secondary | ICD-10-CM | POA: Diagnosis not present

## 2022-05-09 DIAGNOSIS — G2581 Restless legs syndrome: Secondary | ICD-10-CM | POA: Diagnosis not present

## 2022-05-09 DIAGNOSIS — R630 Anorexia: Secondary | ICD-10-CM | POA: Diagnosis not present

## 2022-05-09 DIAGNOSIS — I10 Essential (primary) hypertension: Secondary | ICD-10-CM | POA: Diagnosis not present

## 2022-05-09 DIAGNOSIS — G8929 Other chronic pain: Secondary | ICD-10-CM | POA: Diagnosis not present

## 2022-05-31 ENCOUNTER — Ambulatory Visit: Payer: Medicare Other | Admitting: Infectious Disease

## 2022-06-06 DIAGNOSIS — J309 Allergic rhinitis, unspecified: Secondary | ICD-10-CM | POA: Diagnosis not present

## 2022-06-06 DIAGNOSIS — F0390 Unspecified dementia without behavioral disturbance: Secondary | ICD-10-CM | POA: Diagnosis not present

## 2022-06-06 DIAGNOSIS — G40909 Epilepsy, unspecified, not intractable, without status epilepticus: Secondary | ICD-10-CM | POA: Diagnosis not present

## 2022-06-28 ENCOUNTER — Encounter: Payer: Self-pay | Admitting: Infectious Disease

## 2022-06-28 ENCOUNTER — Ambulatory Visit (INDEPENDENT_AMBULATORY_CARE_PROVIDER_SITE_OTHER): Payer: Medicare Other | Admitting: Infectious Disease

## 2022-06-28 ENCOUNTER — Other Ambulatory Visit: Payer: Self-pay

## 2022-06-28 VITALS — BP 143/88 | HR 63 | Temp 97.6°F

## 2022-06-28 DIAGNOSIS — F17200 Nicotine dependence, unspecified, uncomplicated: Secondary | ICD-10-CM | POA: Insufficient documentation

## 2022-06-28 DIAGNOSIS — E782 Mixed hyperlipidemia: Secondary | ICD-10-CM

## 2022-06-28 DIAGNOSIS — Z23 Encounter for immunization: Secondary | ICD-10-CM

## 2022-06-28 DIAGNOSIS — R569 Unspecified convulsions: Secondary | ICD-10-CM

## 2022-06-28 DIAGNOSIS — F1010 Alcohol abuse, uncomplicated: Secondary | ICD-10-CM

## 2022-06-28 DIAGNOSIS — F17210 Nicotine dependence, cigarettes, uncomplicated: Secondary | ICD-10-CM

## 2022-06-28 DIAGNOSIS — A812 Progressive multifocal leukoencephalopathy: Secondary | ICD-10-CM | POA: Diagnosis not present

## 2022-06-28 DIAGNOSIS — B20 Human immunodeficiency virus [HIV] disease: Secondary | ICD-10-CM

## 2022-06-28 DIAGNOSIS — Z7185 Encounter for immunization safety counseling: Secondary | ICD-10-CM

## 2022-06-28 NOTE — Addendum Note (Signed)
Addended by: Juanita Laster on: 06/28/2022 12:12 PM   Modules accepted: Orders

## 2022-06-28 NOTE — Progress Notes (Signed)
Chief complaint: followup for HIV disease on medications  Subjective:    Patient ID: Brian Marquez, male    DOB: Jul 03, 1960, 62 y.o.   MRN: [id number]  HIV Positive/AIDS   62 year old Caucasian man with HIV/AIDS, prior polysubstance abuse in the past HIV encephalopathy still living in ALF facility. Phone number is  [phone]. He has been compliant with  to [facility].   Acen does have comorbid coronary artery disease, HIV dementia smoking.  He had RPR + to 1:4 and we gave him bicillin x 1.  Been diagnosed with a seizure disorder at [facility] and placed on Keppra.    BIKTARVY is being filled regularly and is taking the medication regularly according to him staff from the assisted living.  Dose of Keppra has recently been adjusted by neurology.  He has been given an updated COVID-19 vaccine this April.  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  No family history on file.    Social History   Socioeconomic History   Marital status: Single    Spouse name: Not on file   Number of children: Not on file   Years of education: Not on file   Highest education level: Not on file  Occupational History   Not on file  Tobacco Use   Smoking status: Every Day    Packs/day: .5    Types: Cigarettes   Smokeless tobacco: Never  Substance and Sexual Activity   Alcohol use: Yes    Alcohol/week: 0.0 standard drinks of alcohol    Comment: Occassional, 40oz when he does drink   Drug use: No   Sexual activity: Never     Partners: Female    Comment: declined condoms  Other Topics Concern   Not on file  Social History Narrative   Not on file   Social Determinants of Health   Financial Resource Strain: Not on file  Food Insecurity: Not on file  Transportation Needs: Not on file  Physical Activity: Not on file  Stress: Not on file  Social Connections: Not on file    No Known Allergies   Current Outpatient Medications:    atorvastatin (LIPITOR) 40 MG tablet, Take 1 tablet (40 mg total) by mouth every evening., Disp: 90 tablet, Rfl: 3   bictegravir-emtricitabine-tenofovir AF (BIKTARVY) 50-200-25 MG TABS tablet, Take 1 tablet by mouth daily., Disp: 30 tablet, Rfl: 11   disulfiram (ANTABUSE) 250 MG tablet, Take 500 mg by mouth daily., Disp: , Rfl:    fexofenadine (ALLEGRA) 180 MG tablet, TAKE 1 TABLET BY MOUTH ONCE DAILY., Disp: 30 tablet, Rfl: 5   Fluticasone-Salmeterol (ADVAIR) 250-50 MCG/DOSE AEPB, Inhale 1 puff into the lungs every 12 (twelve) hours., Disp: , Rfl:    gabapentin (NEURONTIN) 300 MG capsule, TAKE 1 CAPSULE BY MOUTH AT BEDTIME (Patient not taking: No sig reported), Disp: 30 capsule, Rfl: 5   gemfibrozil (LOPID) 600 MG tablet, Take 600 mg by mouth 2 (two) times daily., Disp: ,  Rfl:    GNP ASPIRIN LOW DOSE 81 MG EC tablet, TAKE 1 TABLET BY MOUTH ONCE DAILY., Disp: 30 tablet, Rfl: 0   hydrOXYzine (VISTARIL) 25 MG capsule, Take 25 mg by mouth 2 (two) times daily., Disp: , Rfl:    ibuprofen (ADVIL) 800 MG tablet, Take 800 mg by mouth 2 (two) times daily as needed., Disp: , Rfl:    levETIRAcetam (KEPPRA) 500 MG tablet, Take by mouth., Disp: , Rfl:    levETIRAcetam (KEPPRA) 500 MG tablet, Take 1 tablet (500 mg total) by mouth 2 (two) times daily., Disp: 60 tablet, Rfl: 3   metoprolol tartrate (LOPRESSOR) 25 MG tablet, Take 25 mg by mouth daily. (Patient not taking: Reported on 12/07/2021), Disp: , Rfl:    mirtazapine (REMERON) 7.5 MG tablet, TAKE ONE TABLET BY MOUTH AT BEDTIME. (Patient not  taking: No sig reported), Disp: 30 tablet, Rfl: 5   nitroGLYCERIN (NITROSTAT) 0.4 MG SL tablet, Place 1 tablet (0.4 mg total) under the tongue every 5 (five) minutes x 3 doses as needed for chest pain. (Patient not taking: Reported on 03/20/2019), Disp: 30 tablet, Rfl: 0   traMADol (ULTRAM) 50 MG tablet, Take 50 mg by mouth 2 (two) times daily as needed., Disp: , Rfl:    triamcinolone cream (KENALOG) 0.5 %, APPLY AS DIRECTED TO AFFECTED AREA TWICE DAILY AS NEEDED *MAY SELF APPLY AT BEDTIME*, Disp: 30 g, Rfl: 0   vitamin B-12 (CYANOCOBALAMIN) 1000 MCG tablet, Take 1 tablet (1,000 mcg total) by mouth daily., Disp: 30 tablet, Rfl: 3    Review of Systems  Unable to perform ROS: Dementia       Objective:   Physical Exam Constitutional:      Appearance: He is well-developed.  HENT:     Head: Normocephalic and atraumatic.  Eyes:     Conjunctiva/sclera: Conjunctivae normal.  Cardiovascular:     Rate and Rhythm: Normal rate and regular rhythm.  Pulmonary:     Effort: Pulmonary effort is normal. No respiratory distress.     Breath sounds: No wheezing.  Abdominal:     General: There is no distension.     Palpations: Abdomen is soft.  Musculoskeletal:        General: No tenderness. Normal range of motion.     Cervical back: Normal range of motion and neck supple.  Skin:    General: Skin is warm and dry.     Coloration: Skin is not pale.     Findings: No erythema or rash.  Neurological:     Mental Status: He is alert and oriented to person, place, and time.  Psychiatric:        Attention and Perception: Attention normal.        Mood and Affect: Mood normal.        Speech: Speech normal.        Behavior: Behavior normal.        Cognition and Memory: Memory is impaired.        Judgment: Judgment normal.         Assessment & Plan:   HIV disease:  I will add order HIV viral load CD4 count CBC with differential CMP, RPR GC and chlamydia and I will continue  [facility]  Biktarvy  prescription  Seizure disorder: Continue with Keppra per neurology  Hyperlipidemia will continue on Lipitor   For nares he is on aspirin and Lipitor.  Vaccine counseling he is close to the time.  To get Prevnar 20  so recommend that he get this today.

## 2022-06-29 ENCOUNTER — Telehealth: Payer: Self-pay

## 2022-06-29 DIAGNOSIS — B20 Human immunodeficiency virus [HIV] disease: Secondary | ICD-10-CM

## 2022-06-29 LAB — T-HELPER CELLS (CD4) COUNT (NOT AT ARMC)
CD4 % Helper T Cell: 36 % (ref 33–65)
CD4 T Cell Abs: 178 /uL — ABNORMAL LOW (ref 400–1790)

## 2022-06-29 NOTE — Telephone Encounter (Signed)
-----   Message from Randall Hiss, MD sent at 06/29/2022 11:32 AM EDT ----- Adarsh's CD4 was lower than normal though % the same, would like to recheck viral load and CD4 in about 2 weeks ----- Message ----- From: Janace Hoard Lab Results In Sent: 06/28/2022   3:36 PM EDT To: Randall Hiss, MD

## 2022-06-29 NOTE — Telephone Encounter (Signed)
Spoke with Renae Fickle, discussed low CD4 and need for repeat labs. Arranged appointment for 5/14.  Sandie Ano, RN

## 2022-07-01 LAB — CBC WITH DIFFERENTIAL/PLATELET
Absolute Monocytes: 363 cells/uL (ref 200–950)
Basophils Absolute: 52 cells/uL (ref 0–200)
Basophils Relative: 0.7 %
Eosinophils Absolute: 104 cells/uL (ref 15–500)
Eosinophils Relative: 1.4 %
HCT: 39 % (ref 38.5–50.0)
Hemoglobin: 13.5 g/dL (ref 13.2–17.1)
Lymphs Abs: 540 cells/uL — ABNORMAL LOW (ref 850–3900)
MCH: 34.6 pg — ABNORMAL HIGH (ref 27.0–33.0)
MCHC: 34.6 g/dL (ref 32.0–36.0)
MCV: 100 fL (ref 80.0–100.0)
MPV: 9.5 fL (ref 7.5–12.5)
Monocytes Relative: 4.9 %
Neutro Abs: 6342 cells/uL (ref 1500–7800)
Neutrophils Relative %: 85.7 %
Platelets: 201 10*3/uL (ref 140–400)
RBC: 3.9 10*6/uL — ABNORMAL LOW (ref 4.20–5.80)
RDW: 13.1 % (ref 11.0–15.0)
Total Lymphocyte: 7.3 %
WBC: 7.4 10*3/uL (ref 3.8–10.8)

## 2022-07-01 LAB — COMPLETE METABOLIC PANEL WITH GFR
AG Ratio: 1.9 (calc) (ref 1.0–2.5)
ALT: 10 U/L (ref 9–46)
AST: 22 U/L (ref 10–35)
Albumin: 4.7 g/dL (ref 3.6–5.1)
Alkaline phosphatase (APISO): 78 U/L (ref 35–144)
BUN/Creatinine Ratio: 12 (calc) (ref 6–22)
BUN: 22 mg/dL (ref 7–25)
CO2: 23 mmol/L (ref 20–32)
Calcium: 9.6 mg/dL (ref 8.6–10.3)
Chloride: 109 mmol/L (ref 98–110)
Creat: 1.85 mg/dL — ABNORMAL HIGH (ref 0.70–1.35)
Globulin: 2.5 g/dL (calc) (ref 1.9–3.7)
Glucose, Bld: 90 mg/dL (ref 65–99)
Potassium: 4.1 mmol/L (ref 3.5–5.3)
Sodium: 141 mmol/L (ref 135–146)
Total Bilirubin: 0.4 mg/dL (ref 0.2–1.2)
Total Protein: 7.2 g/dL (ref 6.1–8.1)
eGFR: 41 mL/min/{1.73_m2} — ABNORMAL LOW (ref >=60)

## 2022-07-01 LAB — LIPID PANEL
Cholesterol: 115 mg/dL (ref <200)
HDL: 62 mg/dL (ref >=40)
LDL Cholesterol (Calc): 40 mg/dL (calc)
Non-HDL Cholesterol (Calc): 53 mg/dL (calc) (ref <130)
Total CHOL/HDL Ratio: 1.9 (calc) (ref <5.0)
Triglycerides: 52 mg/dL (ref <150)

## 2022-07-01 LAB — HIV-1 RNA QUANT-NO REFLEX-BLD
HIV 1 RNA Quant: 72 Copies/mL — ABNORMAL HIGH
HIV-1 RNA Quant, Log: 1.86 Log cps/mL — ABNORMAL HIGH

## 2022-07-01 LAB — HEPATITIS C AB W/RFL RNA, PCR + GENO: Hepatitis C Ab: NONREACTIVE

## 2022-07-01 LAB — RPR: RPR Ser Ql: NONREACTIVE

## 2022-07-04 DIAGNOSIS — N1832 Chronic kidney disease, stage 3b: Secondary | ICD-10-CM | POA: Diagnosis not present

## 2022-07-04 DIAGNOSIS — G40909 Epilepsy, unspecified, not intractable, without status epilepticus: Secondary | ICD-10-CM | POA: Diagnosis not present

## 2022-07-04 DIAGNOSIS — I10 Essential (primary) hypertension: Secondary | ICD-10-CM | POA: Diagnosis not present

## 2022-07-04 DIAGNOSIS — R630 Anorexia: Secondary | ICD-10-CM | POA: Diagnosis not present

## 2022-07-05 ENCOUNTER — Telehealth: Payer: Self-pay

## 2022-07-05 NOTE — Telephone Encounter (Signed)
Patient returned call, he is sure that he has not missed any doses of his Biktarvy. Reminded him of upcoming lab visit next week.   Sandie Ano, RN

## 2022-07-05 NOTE — Telephone Encounter (Signed)
-----   Message from Raymondo Band, MD sent at 07/01/2022 10:34 PM EDT ----- Hi team  He has blips in his viral load. Please let him know to make sure taking hiv med everyday  Nothing drastic to worry. He has f/u for recheck with Dr Daiva Eves in 11/2022  thanks

## 2022-07-05 NOTE — Telephone Encounter (Signed)
Called patient, facility worker answered and states he's not available right now. He has appointment for repeat VL and CD4 on 5/14.  Aundra Millet Rondell Reams, RN

## 2022-07-12 ENCOUNTER — Other Ambulatory Visit: Payer: Self-pay

## 2022-07-12 ENCOUNTER — Other Ambulatory Visit: Payer: Medicare Other

## 2022-07-12 DIAGNOSIS — B20 Human immunodeficiency virus [HIV] disease: Secondary | ICD-10-CM

## 2022-07-13 LAB — T-HELPER CELL (CD4) - (RCID CLINIC ONLY)
CD4 % Helper T Cell: 32 % — ABNORMAL LOW (ref 33–65)
CD4 T Cell Abs: 443 /uL (ref 400–1790)

## 2022-07-16 LAB — HIV-1 RNA QUANT-NO REFLEX-BLD
HIV 1 RNA Quant: 34 Copies/mL — ABNORMAL HIGH
HIV-1 RNA Quant, Log: 1.53 Log cps/mL — ABNORMAL HIGH

## 2022-08-01 DIAGNOSIS — R630 Anorexia: Secondary | ICD-10-CM | POA: Diagnosis not present

## 2022-08-01 DIAGNOSIS — J309 Allergic rhinitis, unspecified: Secondary | ICD-10-CM | POA: Diagnosis not present

## 2022-08-01 DIAGNOSIS — G8929 Other chronic pain: Secondary | ICD-10-CM | POA: Diagnosis not present

## 2022-08-01 DIAGNOSIS — I10 Essential (primary) hypertension: Secondary | ICD-10-CM | POA: Diagnosis not present

## 2022-08-29 DIAGNOSIS — F0390 Unspecified dementia without behavioral disturbance: Secondary | ICD-10-CM | POA: Diagnosis not present

## 2022-08-29 DIAGNOSIS — I10 Essential (primary) hypertension: Secondary | ICD-10-CM | POA: Diagnosis not present

## 2022-08-29 DIAGNOSIS — N189 Chronic kidney disease, unspecified: Secondary | ICD-10-CM | POA: Diagnosis not present

## 2022-08-29 DIAGNOSIS — R630 Anorexia: Secondary | ICD-10-CM | POA: Diagnosis not present

## 2022-09-26 DIAGNOSIS — G40909 Epilepsy, unspecified, not intractable, without status epilepticus: Secondary | ICD-10-CM | POA: Diagnosis not present

## 2022-09-26 DIAGNOSIS — G8929 Other chronic pain: Secondary | ICD-10-CM | POA: Diagnosis not present

## 2022-09-26 DIAGNOSIS — G2581 Restless legs syndrome: Secondary | ICD-10-CM | POA: Diagnosis not present

## 2022-09-26 DIAGNOSIS — I10 Essential (primary) hypertension: Secondary | ICD-10-CM | POA: Diagnosis not present

## 2022-10-24 DIAGNOSIS — G40909 Epilepsy, unspecified, not intractable, without status epilepticus: Secondary | ICD-10-CM | POA: Diagnosis not present

## 2022-10-24 DIAGNOSIS — N1832 Chronic kidney disease, stage 3b: Secondary | ICD-10-CM | POA: Diagnosis not present

## 2022-10-24 DIAGNOSIS — I10 Essential (primary) hypertension: Secondary | ICD-10-CM | POA: Diagnosis not present

## 2022-10-24 DIAGNOSIS — E785 Hyperlipidemia, unspecified: Secondary | ICD-10-CM | POA: Diagnosis not present

## 2022-11-21 DIAGNOSIS — I10 Essential (primary) hypertension: Secondary | ICD-10-CM | POA: Diagnosis not present

## 2022-11-21 DIAGNOSIS — R21 Rash and other nonspecific skin eruption: Secondary | ICD-10-CM | POA: Diagnosis not present

## 2022-11-21 DIAGNOSIS — N1832 Chronic kidney disease, stage 3b: Secondary | ICD-10-CM | POA: Diagnosis not present

## 2022-11-21 DIAGNOSIS — F0390 Unspecified dementia without behavioral disturbance: Secondary | ICD-10-CM | POA: Diagnosis not present

## 2022-12-19 DIAGNOSIS — N1832 Chronic kidney disease, stage 3b: Secondary | ICD-10-CM | POA: Diagnosis not present

## 2022-12-19 DIAGNOSIS — G2581 Restless legs syndrome: Secondary | ICD-10-CM | POA: Diagnosis not present

## 2022-12-19 DIAGNOSIS — J309 Allergic rhinitis, unspecified: Secondary | ICD-10-CM | POA: Diagnosis not present

## 2022-12-19 DIAGNOSIS — I10 Essential (primary) hypertension: Secondary | ICD-10-CM | POA: Diagnosis not present

## 2022-12-20 ENCOUNTER — Other Ambulatory Visit: Payer: Self-pay | Admitting: Infectious Disease

## 2022-12-20 DIAGNOSIS — Z21 Asymptomatic human immunodeficiency virus [HIV] infection status: Secondary | ICD-10-CM

## 2022-12-20 DIAGNOSIS — I214 Non-ST elevation (NSTEMI) myocardial infarction: Secondary | ICD-10-CM

## 2022-12-21 ENCOUNTER — Other Ambulatory Visit: Payer: Self-pay

## 2022-12-21 DIAGNOSIS — Z21 Asymptomatic human immunodeficiency virus [HIV] infection status: Secondary | ICD-10-CM

## 2022-12-21 MED ORDER — BIKTARVY 50-200-25 MG PO TABS: 1.0000 | ORAL_TABLET | Freq: Every day | ORAL | 0 refills | Status: DC

## 2022-12-21 NOTE — Telephone Encounter (Signed)
DDI with atorvastatin and gemfibrozil. Called [facility] and spoke with Lucendia Herrlich, nurse at the facility. She confirms patient is receiving both atorvastatin and gemfibrozil. Routing to provider to advise if patient needs both or if one can be discontinued.   Sandie Ano, RN

## 2022-12-22 NOTE — Telephone Encounter (Signed)
Spoke with Lucendia Herrlich, nurse at [facility], and provided verbal order per Dr. Daiva Eves to stop gemfibrozil in order to continue atorvastatin.   Sandie Ano, RN

## 2022-12-22 NOTE — Telephone Encounter (Signed)
It is not recommended to use gemfibrozil with atorvastatin since it can increase concentrations of atorvastatin and thus the risk for rhabdo.

## 2022-12-26 NOTE — Telephone Encounter (Signed)
Received call from [place], RN regarding d/c gemfibrozil. Will need signed order for pharmacy. Will fax this as requested. P: [phone]  F: [phone] Juanita Laster, RMA

## 2022-12-29 ENCOUNTER — Other Ambulatory Visit: Payer: Self-pay

## 2022-12-29 ENCOUNTER — Ambulatory Visit (INDEPENDENT_AMBULATORY_CARE_PROVIDER_SITE_OTHER): Payer: Medicare Other | Admitting: Infectious Disease

## 2022-12-29 VITALS — BP 122/77 | HR 63 | Temp 97.8°F | Ht 68.0 in | Wt 122.6 lb

## 2022-12-29 DIAGNOSIS — E538 Deficiency of other specified B group vitamins: Secondary | ICD-10-CM | POA: Diagnosis not present

## 2022-12-29 DIAGNOSIS — G40909 Epilepsy, unspecified, not intractable, without status epilepticus: Secondary | ICD-10-CM

## 2022-12-29 DIAGNOSIS — I214 Non-ST elevation (NSTEMI) myocardial infarction: Secondary | ICD-10-CM

## 2022-12-29 DIAGNOSIS — E785 Hyperlipidemia, unspecified: Secondary | ICD-10-CM | POA: Diagnosis not present

## 2022-12-29 DIAGNOSIS — Z21 Asymptomatic human immunodeficiency virus [HIV] infection status: Secondary | ICD-10-CM

## 2022-12-29 DIAGNOSIS — T7840XD Allergy, unspecified, subsequent encounter: Secondary | ICD-10-CM

## 2022-12-29 DIAGNOSIS — B20 Human immunodeficiency virus [HIV] disease: Secondary | ICD-10-CM | POA: Diagnosis not present

## 2022-12-29 MED ORDER — BIKTARVY 50-200-25 MG PO TABS: 1.0000 | ORAL_TABLET | Freq: Every day | ORAL | 11 refills | Status: DC

## 2022-12-29 MED ORDER — ATORVASTATIN CALCIUM 40 MG PO TABS: 40.0000 mg | ORAL_TABLET | Freq: Every evening | ORAL | 11 refills | Status: DC

## 2022-12-29 NOTE — Progress Notes (Signed)
Subjective:  Chief complaint: followup for HIV disease on medications     Patient ID: Brian Marquez, male    DOB: 1961-02-13, 62 y.o.   MRN: [id number]  HPI  Discussed the use of AI scribe software for clinical note transcription with the patient, who gave verbal consent to proceed.  History of Present Illness   The patient, with a history of HIV, chronic kidney disease, hyperlipidemia, and neuropathy, presents for a routine follow-up. They are currently on Biktarvy for HIV management, and recent labs showed a viral load of 34, which is considered undetectable by FDA with it being <50, and a CD4 count of 443, a significant improvement from a previous count of 178. The patient recalls possibly being ill around the time of the lower CD4 count.  They also have chronic kidney disease, which has remained stable, and hyperlipidemia, managed with Lipitor. They are on folic acid and B12 for deficiencies, and fexofenadine (Allegra) and hydroxyzine for allergies and itching. They also take Keppra for seizure prevention, though they report no recent seizures. They have been prescribed tramadol for chronic neuropathic pain in their feet. They also use hydrocortisone cream as needed and pseudogest for a runny nose.  The patient reports receiving both the flu and COVID-19 vaccines at their residence. They acquired HIV through sexual contact with a woman and deny any history of receptive anal intercourse.       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  No family history on file.    Social History   Socioeconomic History    Marital status: Single    Spouse name: Not on file   Number of children: Not on file   Years of education: Not on file   Highest education level: Not on file  Occupational History   Not on file  Tobacco Use   Smoking status: Every Day    Current packs/day: 0.50    Types: Cigarettes   Smokeless tobacco: Never  Substance and Sexual Activity   Alcohol use: Yes    Alcohol/week: 0.0 standard drinks of alcohol    Comment: Occassional, 40oz when he does drink   Drug use: No   Sexual activity: Never    Partners: Female    Comment: declined condoms  Other Topics Concern   Not on file  Social History Narrative   Not on file   Social Determinants of Health   Financial Resource Strain: Not on file  Food Insecurity: Not on file  Transportation Needs: Not on file  Physical Activity: Not on file  Stress: Not on file  Social Connections: Not on file    No Known Allergies   Current Outpatient Medications:    acetaminophen (TYLENOL) 650 MG CR tablet, Take 650 mg by mouth every 8 (eight) hours as needed for pain., Disp: , Rfl:    fexofenadine (ALLEGRA) 180 MG tablet, TAKE 1 TABLET BY MOUTH ONCE DAILY., Disp: 30 tablet, Rfl: 5   folic acid (FOLVITE) 1 MG tablet, Take 1 mg by mouth daily., Disp: , Rfl:    GNP  ASPIRIN LOW DOSE 81 MG EC tablet, TAKE 1 TABLET BY MOUTH ONCE DAILY., Disp: 30 tablet, Rfl: 0   hydrOXYzine (VISTARIL) 25 MG capsule, Take 25 mg by mouth 2 (two) times daily., Disp: , Rfl:    levETIRAcetam (KEPPRA) 500 MG tablet, Take by mouth., Disp: , Rfl:    traMADol (ULTRAM) 50 MG tablet, Take 50 mg by mouth 2 (two) times daily as needed., Disp: , Rfl:    vitamin B-12 (CYANOCOBALAMIN) 1000 MCG tablet, Take 1 tablet (1,000 mcg total) by mouth daily., Disp: 30 tablet, Rfl: 3   atorvastatin (LIPITOR) 40 MG tablet, Take 1 tablet (40 mg total) by mouth every evening., Disp: 30 tablet, Rfl: 11   bictegravir-emtricitabine-tenofovir AF (BIKTARVY) 50-200-25 MG TABS tablet, Take 1 tablet by  mouth daily., Disp: 30 tablet, Rfl: 11   disulfiram (ANTABUSE) 250 MG tablet, Take 500 mg by mouth daily. (Patient not taking: Reported on 06/28/2022), Disp: , Rfl:    Fluticasone-Salmeterol (ADVAIR) 250-50 MCG/DOSE AEPB, Inhale 1 puff into the lungs every 12 (twelve) hours. (Patient not taking: Reported on 06/28/2022), Disp: , Rfl:    gabapentin (NEURONTIN) 300 MG capsule, TAKE 1 CAPSULE BY MOUTH AT BEDTIME (Patient not taking: No sig reported), Disp: 30 capsule, Rfl: 5   levETIRAcetam (KEPPRA) 500 MG tablet, Take 1 tablet (500 mg total) by mouth 2 (two) times daily. (Patient not taking: Reported on 06/28/2022), Disp: 60 tablet, Rfl: 3   metoprolol tartrate (LOPRESSOR) 25 MG tablet, Take 25 mg by mouth daily. (Patient not taking: Reported on 12/07/2021), Disp: , Rfl:    mirtazapine (REMERON) 7.5 MG tablet, TAKE ONE TABLET BY MOUTH AT BEDTIME. (Patient not taking: No sig reported), Disp: 30 tablet, Rfl: 5   nitroGLYCERIN (NITROSTAT) 0.4 MG SL tablet, Place 1 tablet (0.4 mg total) under the tongue every 5 (five) minutes x 3 doses as needed for chest pain. (Patient not taking: Reported on 03/20/2019), Disp: 30 tablet, Rfl: 0   triamcinolone cream (KENALOG) 0.5 %, APPLY AS DIRECTED TO AFFECTED AREA TWICE DAILY AS NEEDED *MAY SELF APPLY AT BEDTIME* (Patient not taking: Reported on 06/28/2022), Disp: 30 g, Rfl: 0   Review of Systems  Constitutional:  Negative for activity change, appetite change, chills, diaphoresis, fatigue, fever and unexpected weight change.  HENT:  Negative for congestion, rhinorrhea, sinus pressure, sneezing, sore throat and trouble swallowing.   Eyes:  Negative for photophobia and visual disturbance.  Respiratory:  Negative for cough, chest tightness, shortness of breath, wheezing and stridor.   Cardiovascular:  Negative for chest pain, palpitations and leg swelling.  Gastrointestinal:  Positive for constipation. Negative for abdominal distention, anal bleeding, blood in stool,  diarrhea, nausea and vomiting.  Genitourinary:  Negative for difficulty urinating, dysuria, flank pain and hematuria.  Musculoskeletal:  Negative for arthralgias, back pain, gait problem, joint swelling and myalgias.  Skin:  Negative for color change, pallor, rash and wound.  Neurological:  Negative for dizziness, tremors, weakness and light-headedness.  Hematological:  Negative for adenopathy. Does not bruise/bleed easily.  Psychiatric/Behavioral:  Negative for agitation, behavioral problems, confusion, decreased concentration, dysphoric mood and sleep disturbance.        Objective:   Physical Exam Constitutional:      Appearance: He is well-developed.  HENT:     Head: Normocephalic and atraumatic.  Eyes:     Conjunctiva/sclera: Conjunctivae normal.  Cardiovascular:     Rate and Rhythm: Normal rate and regular rhythm.  Pulmonary:     Effort: Pulmonary effort is normal. No  respiratory distress.     Breath sounds: No wheezing.  Abdominal:     General: There is no distension.     Palpations: Abdomen is soft.  Musculoskeletal:        General: No tenderness. Normal range of motion.     Cervical back: Normal range of motion and neck supple.  Skin:    General: Skin is warm and dry.     Coloration: Skin is not pale.     Findings: No erythema or rash.  Neurological:     General: No focal deficit present.     Mental Status: He is alert and oriented to person, place, and time.  Psychiatric:        Mood and Affect: Mood normal.        Behavior: Behavior normal.        Thought Content: Thought content normal.        Judgment: Judgment normal.           Assessment & Plan:   Assessment and Plan    HIV Viral load undetectable and CD4 count improved from 178 to 443. Stable on Biktarvy. -Continue Biktarvy. -Check viral load and CD4 count today.  Chronic Kidney Disease Stable creatinine 6 months ago. -Check renal function today.  Hyperlipidemia On Lipitor. -Continue  Lipitor. -Check lipid panel today.  Folic Acid and B12 Deficiency On folic acid and B12 supplementation. -Continue folic acid and B12 supplementation.  Allergies On fexofenadine (Allegra) and hydroxyzine. -Continue fexofenadine and hydroxyzine.  Seizure Disorder No recent seizures. On Keppra. -Continue Keppra.  Neuropathic Pain Chronic foot pain likely secondary to neuropathy. On Tramadol. -Continue Tramadol.  General Health Maintenance -Confirmed receipt of flu and COVID vaccines. -Return in 6 months for follow-up.

## 2022-12-29 NOTE — Addendum Note (Signed)
Addended by: Harley Alto on: 12/29/2022 02:00 PM   Modules accepted: Orders

## 2023-01-01 LAB — CBC WITH DIFFERENTIAL/PLATELET
Absolute Lymphocytes: 1176 {cells}/uL (ref 850–3900)
Absolute Monocytes: 441 {cells}/uL (ref 200–950)
Basophils Absolute: 42 {cells}/uL (ref 0–200)
Basophils Relative: 0.6 %
Eosinophils Absolute: 112 {cells}/uL (ref 15–500)
Eosinophils Relative: 1.6 %
HCT: 42.3 % (ref 38.5–50.0)
Hemoglobin: 14.1 g/dL (ref 13.2–17.1)
MCH: 33.9 pg — ABNORMAL HIGH (ref 27.0–33.0)
MCHC: 33.3 g/dL (ref 32.0–36.0)
MCV: 101.7 fL — ABNORMAL HIGH (ref 80.0–100.0)
MPV: 9.6 fL (ref 7.5–12.5)
Monocytes Relative: 6.3 %
Neutro Abs: 5229 {cells}/uL (ref 1500–7800)
Neutrophils Relative %: 74.7 %
Platelets: 237 10*3/uL (ref 140–400)
RBC: 4.16 10*6/uL — ABNORMAL LOW (ref 4.20–5.80)
RDW: 13 % (ref 11.0–15.0)
Total Lymphocyte: 16.8 %
WBC: 7 10*3/uL (ref 3.8–10.8)

## 2023-01-01 LAB — LIPID PANEL
Cholesterol: 117 mg/dL (ref <200)
HDL: 61 mg/dL (ref >=40)
LDL Cholesterol (Calc): 39 mg/dL
Non-HDL Cholesterol (Calc): 56 mg/dL (ref <130)
Total CHOL/HDL Ratio: 1.9 (calc) (ref <5.0)
Triglycerides: 85 mg/dL (ref <150)

## 2023-01-01 LAB — RPR: RPR Ser Ql: NONREACTIVE

## 2023-01-01 LAB — COMPLETE METABOLIC PANEL WITH GFR
AG Ratio: 2.2 (calc) (ref 1.0–2.5)
ALT: 9 U/L (ref 9–46)
AST: 14 U/L (ref 10–35)
Albumin: 4.3 g/dL (ref 3.6–5.1)
Alkaline phosphatase (APISO): 66 U/L (ref 35–144)
BUN: 17 mg/dL (ref 7–25)
CO2: 30 mmol/L (ref 20–32)
Calcium: 9.8 mg/dL (ref 8.6–10.3)
Chloride: 105 mmol/L (ref 98–110)
Creat: 1.34 mg/dL (ref 0.70–1.35)
Globulin: 2 g/dL (ref 1.9–3.7)
Glucose, Bld: 101 mg/dL — ABNORMAL HIGH (ref 65–99)
Potassium: 4.7 mmol/L (ref 3.5–5.3)
Sodium: 140 mmol/L (ref 135–146)
Total Bilirubin: 0.5 mg/dL (ref 0.2–1.2)
Total Protein: 6.3 g/dL (ref 6.1–8.1)
eGFR: 60 mL/min/{1.73_m2} (ref >=60)

## 2023-01-01 LAB — HIV-1 RNA QUANT-NO REFLEX-BLD
HIV 1 RNA Quant: <20 {copies}/mL — ABNORMAL HIGH
HIV-1 RNA Quant, Log: <1.3 {Log_copies}/mL — ABNORMAL HIGH

## 2023-01-02 LAB — T-HELPER CELLS (CD4) COUNT (NOT AT ARMC)
CD4 % Helper T Cell: 34 % (ref 33–65)
CD4 T Cell Abs: 382 /uL — ABNORMAL LOW (ref 400–1790)

## 2023-01-12 DIAGNOSIS — B351 Tinea unguium: Secondary | ICD-10-CM | POA: Diagnosis not present

## 2023-01-12 DIAGNOSIS — M79674 Pain in right toe(s): Secondary | ICD-10-CM | POA: Diagnosis not present

## 2023-01-12 DIAGNOSIS — R2681 Unsteadiness on feet: Secondary | ICD-10-CM | POA: Diagnosis not present

## 2023-01-12 DIAGNOSIS — E1140 Type 2 diabetes mellitus with diabetic neuropathy, unspecified: Secondary | ICD-10-CM | POA: Diagnosis not present

## 2023-01-12 DIAGNOSIS — M79675 Pain in left toe(s): Secondary | ICD-10-CM | POA: Diagnosis not present

## 2023-01-12 DIAGNOSIS — L84 Corns and callosities: Secondary | ICD-10-CM | POA: Diagnosis not present

## 2023-01-12 DIAGNOSIS — L608 Other nail disorders: Secondary | ICD-10-CM | POA: Diagnosis not present

## 2023-01-16 DIAGNOSIS — G8929 Other chronic pain: Secondary | ICD-10-CM | POA: Diagnosis not present

## 2023-01-16 DIAGNOSIS — G40909 Epilepsy, unspecified, not intractable, without status epilepticus: Secondary | ICD-10-CM | POA: Diagnosis not present

## 2023-01-16 DIAGNOSIS — I10 Essential (primary) hypertension: Secondary | ICD-10-CM | POA: Diagnosis not present

## 2023-01-16 DIAGNOSIS — G2581 Restless legs syndrome: Secondary | ICD-10-CM | POA: Diagnosis not present

## 2023-02-02 ENCOUNTER — Other Ambulatory Visit: Payer: Self-pay

## 2023-02-02 DIAGNOSIS — I214 Non-ST elevation (NSTEMI) myocardial infarction: Secondary | ICD-10-CM

## 2023-02-02 MED ORDER — ATORVASTATIN CALCIUM 40 MG PO TABS: 40.0000 mg | ORAL_TABLET | Freq: Every evening | ORAL | 1 refills | Status: DC

## 2023-02-13 DIAGNOSIS — G2581 Restless legs syndrome: Secondary | ICD-10-CM | POA: Diagnosis not present

## 2023-02-13 DIAGNOSIS — I10 Essential (primary) hypertension: Secondary | ICD-10-CM | POA: Diagnosis not present

## 2023-02-13 DIAGNOSIS — N189 Chronic kidney disease, unspecified: Secondary | ICD-10-CM | POA: Diagnosis not present

## 2023-02-13 DIAGNOSIS — J309 Allergic rhinitis, unspecified: Secondary | ICD-10-CM | POA: Diagnosis not present

## 2023-03-13 DIAGNOSIS — D509 Iron deficiency anemia, unspecified: Secondary | ICD-10-CM | POA: Diagnosis not present

## 2023-03-13 DIAGNOSIS — G8929 Other chronic pain: Secondary | ICD-10-CM | POA: Diagnosis not present

## 2023-03-13 DIAGNOSIS — B20 Human immunodeficiency virus [HIV] disease: Secondary | ICD-10-CM | POA: Diagnosis not present

## 2023-03-13 DIAGNOSIS — I10 Essential (primary) hypertension: Secondary | ICD-10-CM | POA: Diagnosis not present

## 2023-05-08 DIAGNOSIS — G8929 Other chronic pain: Secondary | ICD-10-CM | POA: Diagnosis not present

## 2023-05-08 DIAGNOSIS — I10 Essential (primary) hypertension: Secondary | ICD-10-CM | POA: Diagnosis not present

## 2023-05-08 DIAGNOSIS — G40909 Epilepsy, unspecified, not intractable, without status epilepticus: Secondary | ICD-10-CM | POA: Diagnosis not present

## 2023-05-08 DIAGNOSIS — R110 Nausea: Secondary | ICD-10-CM | POA: Diagnosis not present

## 2023-06-27 ENCOUNTER — Encounter: Payer: Self-pay | Admitting: Infectious Disease

## 2023-06-27 ENCOUNTER — Other Ambulatory Visit: Payer: Self-pay

## 2023-06-27 ENCOUNTER — Ambulatory Visit (INDEPENDENT_AMBULATORY_CARE_PROVIDER_SITE_OTHER): Payer: Medicare Other | Admitting: Infectious Disease

## 2023-06-27 VITALS — BP 122/80 | HR 59 | Temp 98.4°F | Ht 68.0 in | Wt 122.0 lb

## 2023-06-27 DIAGNOSIS — F17210 Nicotine dependence, cigarettes, uncomplicated: Secondary | ICD-10-CM

## 2023-06-27 DIAGNOSIS — B20 Human immunodeficiency virus [HIV] disease: Secondary | ICD-10-CM

## 2023-06-27 DIAGNOSIS — E785 Hyperlipidemia, unspecified: Secondary | ICD-10-CM

## 2023-06-27 DIAGNOSIS — A812 Progressive multifocal leukoencephalopathy: Secondary | ICD-10-CM

## 2023-06-27 DIAGNOSIS — G629 Polyneuropathy, unspecified: Secondary | ICD-10-CM | POA: Diagnosis not present

## 2023-06-27 DIAGNOSIS — F17200 Nicotine dependence, unspecified, uncomplicated: Secondary | ICD-10-CM

## 2023-06-27 DIAGNOSIS — R569 Unspecified convulsions: Secondary | ICD-10-CM

## 2023-06-27 DIAGNOSIS — N189 Chronic kidney disease, unspecified: Secondary | ICD-10-CM

## 2023-06-27 DIAGNOSIS — Z21 Asymptomatic human immunodeficiency virus [HIV] infection status: Secondary | ICD-10-CM | POA: Diagnosis not present

## 2023-06-27 MED ORDER — BIKTARVY 50-200-25 MG PO TABS: 1.0000 | ORAL_TABLET | Freq: Every day | ORAL | 11 refills | Status: AC

## 2023-06-27 MED ORDER — BIKTARVY 50-200-25 MG PO TABS: 1.0000 | ORAL_TABLET | Freq: Every day | ORAL | 11 refills | Status: DC

## 2023-06-27 NOTE — Patient Instructions (Signed)
 Smoking Cessation: [employer]uit[employer] 1-800-QUIT-NOW [phone]); Espaol: 1-855-Djelo-Ya (1-780-445-4976) [email or web address].[email or web address]

## 2023-06-27 NOTE — Progress Notes (Signed)
 Subjective:  Chief complaint: follow-up for HIV disease on medications   Patient ID: Brian Marquez, male    DOB: 11/29/1960, 63 y.o.   MRN: [id number]  HPI  Discussed the use of AI scribe software for clinical note transcription with the patient, who gave verbal consent to proceed.  History of Present Illness   Brian Marquez is a 63 year old male with HIV who presents for routine follow-up.  His viral load remains undetectable at less than 20, and his CD4 count is 382, indicating stable HIV management. He continues on Biktarvy  without new symptoms. His kidney disease is stable. Current medications include Lipitor, folic acid , B12, Allegra , Keppra , and tramadol. He smokes a pack of cigarettes daily and has difficulty quitting. He reports no current alcohol use.       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  No family history on file.    Social History   Socioeconomic History   Marital status: Single    Spouse name: Not on file   Number of children: Not on file   Years of education: Not on file   Highest education level: Not on file  Occupational History   Not on file  Tobacco Use   Smoking status: Every Day    Current packs/day: 0.50    Types: Cigarettes   Smokeless tobacco: Never  Substance and Sexual Activity   Alcohol use: Not Currently    Comment: Occassional, 40oz when he does drink   Drug use: No   Sexual activity: Never    Partners: Female  Other Topics Concern   Not on file  Social History Narrative   Not on file   Social Drivers of Health   Financial Resource Strain: Not on file   Food Insecurity: Not on file  Transportation Needs: Not on file  Physical Activity: Not on file  Stress: Not on file  Social Connections: Not on file    No Known Allergies   Current Outpatient Medications:    acetaminophen  (TYLENOL ) 650 MG CR tablet, Take 650 mg by mouth every 8 (eight) hours as needed for pain., Disp: , Rfl:    atorvastatin  (LIPITOR) 40 MG tablet, Take 1 tablet (40 mg total) by mouth every evening., Disp: 90 tablet, Rfl: 1   bictegravir-emtricitabine -tenofovir  AF (BIKTARVY ) 50-200-25 MG TABS tablet, Take 1 tablet by mouth daily., Disp: 30 tablet, Rfl: 11   fexofenadine  (ALLEGRA ) 180 MG tablet, TAKE 1 TABLET BY MOUTH ONCE DAILY., Disp: 30 tablet, Rfl: 5   folic acid  (FOLVITE ) 1 MG tablet, Take 1 mg by mouth daily., Disp: , Rfl:    gabapentin  (NEURONTIN ) 300 MG capsule, TAKE 1 CAPSULE BY MOUTH AT BEDTIME, Disp: 30 capsule, Rfl: 5   GNP ASPIRIN  LOW DOSE 81 MG EC tablet, TAKE 1 TABLET BY MOUTH ONCE DAILY., Disp: 30 tablet, Rfl: 0   hydrOXYzine (VISTARIL) 25 MG capsule, Take 25 mg by mouth 2 (two) times daily., Disp: , Rfl:    levETIRAcetam  (KEPPRA ) 500 MG tablet, Take by mouth., Disp: , Rfl:    mirtazapine  (  REMERON ) 7.5 MG tablet, TAKE ONE TABLET BY MOUTH AT BEDTIME., Disp: 30 tablet, Rfl: 5   nitroGLYCERIN  (NITROSTAT ) 0.4 MG SL tablet, Place 1 tablet (0.4 mg total) under the tongue every 5 (five) minutes x 3 doses as needed for chest pain., Disp: 30 tablet, Rfl: 0   traMADol (ULTRAM) 50 MG tablet, Take 50 mg by mouth 2 (two) times daily as needed., Disp: , Rfl:    triamcinolone  cream (KENALOG ) 0.5 %, APPLY AS DIRECTED TO AFFECTED AREA TWICE DAILY AS NEEDED *MAY SELF APPLY AT BEDTIME*, Disp: 30 g, Rfl: 0   vitamin B-12 (CYANOCOBALAMIN) 1000 MCG tablet, Take 1 tablet (1,000 mcg total) by mouth daily., Disp: 30 tablet, Rfl: 3   disulfiram  (ANTABUSE ) 250 MG tablet, Take 500 mg by mouth daily. (Patient not taking: Reported on 06/28/2022), Disp: , Rfl:    Fluticasone-Salmeterol  (ADVAIR) 250-50 MCG/DOSE AEPB, Inhale 1 puff into the lungs every 12 (twelve) hours. (Patient not taking: Reported on 06/28/2022), Disp: , Rfl:    levETIRAcetam  (KEPPRA ) 500 MG tablet, Take 1 tablet (500 mg total) by mouth 2 (two) times daily. (Patient not taking: Reported on 06/28/2022), Disp: 60 tablet, Rfl: 3   metoprolol tartrate (LOPRESSOR) 25 MG tablet, Take 25 mg by mouth daily. (Patient not taking: Reported on 12/07/2021), Disp: , Rfl:    Review of Systems  Unable to perform ROS: Dementia       Objective:   Physical Exam Constitutional:      Appearance: He is well-developed.  HENT:     Head: Normocephalic and atraumatic.  Eyes:     Conjunctiva/sclera: Conjunctivae normal.  Cardiovascular:     Rate and Rhythm: Normal rate and regular rhythm.  Pulmonary:     Effort: Pulmonary effort is normal. No respiratory distress.     Breath sounds: No wheezing.  Abdominal:     General: There is no distension.     Palpations: Abdomen is soft.  Musculoskeletal:        General: No tenderness. Normal range of motion.     Cervical back: Normal range of motion and neck supple.  Skin:    General: Skin is warm and dry.     Coloration: Skin is not pale.     Findings: No erythema or rash.  Neurological:     General: No focal deficit present.     Mental Status: He is alert and oriented to person, place, and time.  Psychiatric:        Attention and Perception: Attention and perception normal.        Mood and Affect: Mood and affect normal.        Speech: Speech normal.        Behavior: Behavior normal.        Thought Content: Thought content normal.        Cognition and Memory: He exhibits impaired remote memory.        Judgment: Judgment normal.           Assessment & Plan:   Assessment and Plan    HIV infection, asymptomatic Asymptomatic with undetectable viral load (<20) and CD4 count of 382. Continued ART necessary for viral suppression and immune function. - Continue  Biktarvy . - Order labs for viral load and CD4 count.  Neuropathy Neuropathy in feet with pain managed by Tramadol. Requires ongoing evaluation.  Chronic kidney disease, unspecified Well-managed with stable kidney function.  Seizure disorder Controlled with Keppra , no new seizures reported.  Hyperlipidemia Managed with Lipitor to  maintain cholesterol levels and reduce cardiovascular risk.  Tobacco use Smokes one pack per day, acknowledges difficulty quitting. Advised smoking cessation to reduce cardiovascular and respiratory risks.  Follow-up Necessary to monitor health status and manage conditions. - Schedule follow-up in six months.

## 2023-06-28 LAB — T-HELPER CELLS (CD4) COUNT (NOT AT ARMC)
CD4 % Helper T Cell: 37 % (ref 33–65)
CD4 T Cell Abs: 424 /uL (ref 400–1790)

## 2023-06-29 LAB — COMPLETE METABOLIC PANEL WITHOUT GFR
AG Ratio: 2 (calc) (ref 1.0–2.5)
ALT: 10 U/L (ref 9–46)
AST: 17 U/L (ref 10–35)
Albumin: 4.3 g/dL (ref 3.6–5.1)
Alkaline phosphatase (APISO): 67 U/L (ref 35–144)
BUN/Creatinine Ratio: 11 (calc) (ref 6–22)
BUN: 17 mg/dL (ref 7–25)
CO2: 29 mmol/L (ref 20–32)
Calcium: 9.2 mg/dL (ref 8.6–10.3)
Chloride: 106 mmol/L (ref 98–110)
Creat: 1.51 mg/dL — ABNORMAL HIGH (ref 0.70–1.35)
Globulin: 2.2 g/dL (ref 1.9–3.7)
Glucose, Bld: 89 mg/dL (ref 65–99)
Potassium: 4.5 mmol/L (ref 3.5–5.3)
Sodium: 140 mmol/L (ref 135–146)
Total Bilirubin: 0.3 mg/dL (ref 0.2–1.2)
Total Protein: 6.5 g/dL (ref 6.1–8.1)

## 2023-06-29 LAB — CBC WITH DIFFERENTIAL/PLATELET
Absolute Lymphocytes: 1304 {cells}/uL (ref 850–3900)
Absolute Monocytes: 531 {cells}/uL (ref 200–950)
Basophils Absolute: 53 {cells}/uL (ref 0–200)
Basophils Relative: 0.9 %
Eosinophils Absolute: 89 {cells}/uL (ref 15–500)
Eosinophils Relative: 1.5 %
HCT: 43 % (ref 38.5–50.0)
Hemoglobin: 14.6 g/dL (ref 13.2–17.1)
MCH: 34.4 pg — ABNORMAL HIGH (ref 27.0–33.0)
MCHC: 34 g/dL (ref 32.0–36.0)
MCV: 101.2 fL — ABNORMAL HIGH (ref 80.0–100.0)
MPV: 9.3 fL (ref 7.5–12.5)
Monocytes Relative: 9 %
Neutro Abs: 3924 {cells}/uL (ref 1500–7800)
Neutrophils Relative %: 66.5 %
Platelets: 194 10*3/uL (ref 140–400)
RBC: 4.25 10*6/uL (ref 4.20–5.80)
RDW: 12.6 % (ref 11.0–15.0)
Total Lymphocyte: 22.1 %
WBC: 5.9 10*3/uL (ref 3.8–10.8)

## 2023-06-29 LAB — LIPID PANEL
Cholesterol: 103 mg/dL (ref <200)
HDL: 58 mg/dL (ref >=40)
LDL Cholesterol (Calc): 30 mg/dL
Non-HDL Cholesterol (Calc): 45 mg/dL (ref <130)
Total CHOL/HDL Ratio: 1.8 (calc) (ref <5.0)
Triglycerides: 74 mg/dL (ref <150)

## 2023-06-29 LAB — RPR: RPR Ser Ql: NONREACTIVE

## 2023-06-29 LAB — HIV-1 RNA QUANT-NO REFLEX-BLD
HIV 1 RNA Quant: 39 {copies}/mL — ABNORMAL HIGH
HIV-1 RNA Quant, Log: 1.59 {Log_copies}/mL — ABNORMAL HIGH

## 2023-07-28 NOTE — Progress Notes (Signed)
 The ASCVD Risk score (Arnett DK, et al., 2019) failed to calculate for the following reasons:   Risk score cannot be calculated because patient has a medical history suggesting prior/existing ASCVD  Arlon Bergamo, BSN, RN

## 2023-12-28 NOTE — Progress Notes (Deleted)
 Patient ID: Brian Marquez, male   DOB: 12-04-1960, 63 y.o.   MRN: [id number]

## 2023-12-28 NOTE — Progress Notes (Deleted)
 Subjective:  Chief complaint: follow-up for HIV disease on medications   Patient ID: Brian Marquez, male    DOB: [birth year], 63 y.o.   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  No family history on file.    Social History   Socioeconomic History   Marital status: Single    Spouse name: Not on file   Number of children: Not on file   Years of education: Not on file   Highest education level: Not on file  Occupational History   Not on file  Tobacco Use   Smoking status: Every Day    Current packs/day: 0.50    Types: Cigarettes   Smokeless tobacco: Never  Substance and Sexual Activity   Alcohol use: Not Currently    Comment: Occassional, 40oz when he does drink   Drug use: No   Sexual activity: Never    Partners: Female  Other Topics Concern   Not on file  Social History Narrative   Not on file   Social Drivers of Health   Financial Resource Strain: Not on file  Food Insecurity: Not on file  Transportation Needs: Not on file  Physical Activity: Not on file  Stress: Not on file  Social Connections: Not on file    No Known Allergies   Current Outpatient Medications:    acetaminophen  (TYLENOL ) 650 MG CR tablet, Take 650 mg by mouth every 8 (eight) hours as needed for pain., Disp: , Rfl:    atorvastatin  (LIPITOR) 40 MG tablet, Take 1 tablet (40 mg total) by mouth every evening., Disp: 90 tablet, Rfl: 1   bictegravir-emtricitabine -tenofovir  AF (BIKTARVY ) 50-200-25 MG TABS tablet, Take 1 tablet by mouth daily., Disp: 30 tablet, Rfl: 11   disulfiram  (ANTABUSE ) 250 MG  tablet, Take 500 mg by mouth daily. (Patient not taking: Reported on 06/28/2022), Disp: , Rfl:    fexofenadine  (ALLEGRA ) 180 MG tablet, TAKE 1 TABLET BY MOUTH ONCE DAILY., Disp: 30 tablet, Rfl: 5   Fluticasone-Salmeterol (ADVAIR) 250-50 MCG/DOSE AEPB, Inhale 1 puff into the lungs every 12 (twelve) hours. (Patient not taking: Reported on 06/28/2022), Disp: , Rfl:    folic acid  (FOLVITE ) 1 MG tablet, Take 1 mg by mouth daily., Disp: , Rfl:    gabapentin  (NEURONTIN ) 300 MG capsule, TAKE 1 CAPSULE BY MOUTH AT BEDTIME, Disp: 30 capsule, Rfl: 5   GNP ASPIRIN  LOW DOSE 81 MG EC tablet, TAKE 1 TABLET BY MOUTH ONCE DAILY., Disp: 30 tablet, Rfl: 0   hydrOXYzine (VISTARIL) 25 MG capsule, Take 25 mg by mouth 2 (two) times daily., Disp: , Rfl:    levETIRAcetam  (KEPPRA ) 500 MG tablet, Take by mouth., Disp: , Rfl:    levETIRAcetam  (KEPPRA ) 500 MG tablet, Take 1 tablet (500 mg total) by mouth 2 (two) times daily. (Patient not taking: Reported on 06/28/2022), Disp: 60 tablet, Rfl: 3   metoprolol tartrate (LOPRESSOR) 25 MG tablet, Take 25 mg by mouth daily. (Patient not taking: Reported on 12/07/2021), Disp: , Rfl:    mirtazapine  (REMERON ) 7.5 MG tablet, TAKE  ONE TABLET BY MOUTH AT BEDTIME., Disp: 30 tablet, Rfl: 5   nitroGLYCERIN  (NITROSTAT ) 0.4 MG SL tablet, Place 1 tablet (0.4 mg total) under the tongue every 5 (five) minutes x 3 doses as needed for chest pain., Disp: 30 tablet, Rfl: 0   traMADol (ULTRAM) 50 MG tablet, Take 50 mg by mouth 2 (two) times daily as needed., Disp: , Rfl:    triamcinolone  cream (KENALOG ) 0.5 %, APPLY AS DIRECTED TO AFFECTED AREA TWICE DAILY AS NEEDED *MAY SELF APPLY AT BEDTIME*, Disp: 30 g, Rfl: 0   vitamin B-12 (CYANOCOBALAMIN) 1000 MCG tablet, Take 1 tablet (1,000 mcg total) by mouth daily., Disp: 30 tablet, Rfl: 3   Review of Systems     Objective:   Physical Exam        Assessment & Plan:

## 2024-01-01 ENCOUNTER — Ambulatory Visit: Admitting: Infectious Disease

## 2024-01-01 ENCOUNTER — Other Ambulatory Visit: Payer: Self-pay | Admitting: Infectious Disease

## 2024-01-01 DIAGNOSIS — I214 Non-ST elevation (NSTEMI) myocardial infarction: Secondary | ICD-10-CM

## 2024-01-01 DIAGNOSIS — Z21 Asymptomatic human immunodeficiency virus [HIV] infection status: Secondary | ICD-10-CM

## 2024-01-01 DIAGNOSIS — B20 Human immunodeficiency virus [HIV] disease: Secondary | ICD-10-CM

## 2024-01-01 DIAGNOSIS — R569 Unspecified convulsions: Secondary | ICD-10-CM

## 2024-01-01 DIAGNOSIS — E782 Mixed hyperlipidemia: Secondary | ICD-10-CM

## 2024-01-01 DIAGNOSIS — A812 Progressive multifocal leukoencephalopathy: Secondary | ICD-10-CM

## 2024-01-01 DIAGNOSIS — G909 Disorder of the autonomic nervous system, unspecified: Secondary | ICD-10-CM

## 2024-01-11 ENCOUNTER — Other Ambulatory Visit: Payer: Self-pay

## 2024-01-11 ENCOUNTER — Ambulatory Visit: Admitting: Internal Medicine

## 2024-01-11 ENCOUNTER — Encounter: Payer: Self-pay | Admitting: Internal Medicine

## 2024-01-11 VITALS — BP 143/84 | HR 63 | Wt 122.0 lb

## 2024-01-11 DIAGNOSIS — Z79899 Other long term (current) drug therapy: Secondary | ICD-10-CM | POA: Diagnosis not present

## 2024-01-11 DIAGNOSIS — N189 Chronic kidney disease, unspecified: Secondary | ICD-10-CM | POA: Diagnosis not present

## 2024-01-11 DIAGNOSIS — B20 Human immunodeficiency virus [HIV] disease: Secondary | ICD-10-CM

## 2024-01-11 NOTE — Progress Notes (Signed)
 [facility]     HPI: Brian Marquez is a 63 y.o. male presents for HIV management on Biktarvy . Pts other PMHx include: chronic kidney disease, hyperlipidemia, seizures o keppra , seasonal allerfges and neuropathy  Please see hpi form 06/27/23 for further detals Today: notes he is commming from [facility]. Not sexually active last visit.    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  No family history on file. Current Outpatient Medications on File Prior to Visit  Medication Sig Dispense Refill   acetaminophen  (TYLENOL ) 650 MG CR tablet Take 650 mg by mouth every 8 (eight) hours as needed for pain.     atorvastatin  (LIPITOR) 40 MG tablet TAKE ONE TABLET BY MOUTH EVERY EVENING 30 tablet 0   bictegravir-emtricitabine -tenofovir  AF (BIKTARVY ) 50-200-25 MG TABS tablet Take 1 tablet by mouth daily. 30 tablet 11   disulfiram  (ANTABUSE ) 250 MG tablet Take 500 mg by mouth daily.     fexofenadine  (ALLEGRA ) 180 MG tablet TAKE 1 TABLET BY MOUTH ONCE DAILY. 30 tablet 5   Fluticasone-Salmeterol (ADVAIR) 250-50 MCG/DOSE AEPB Inhale 1 puff into the lungs every 12 (twelve) hours.     folic acid  (FOLVITE ) 1 MG tablet Take 1 mg by mouth daily.     gabapentin  (NEURONTIN ) 300 MG capsule TAKE 1 CAPSULE BY MOUTH AT BEDTIME 30 capsule 5   GNP ASPIRIN  LOW DOSE 81 MG EC tablet TAKE 1 TABLET BY MOUTH ONCE DAILY. 30 tablet 0   hydrOXYzine (VISTARIL) 25 MG capsule Take 25 mg by mouth 2 (two) times daily.     levETIRAcetam  (KEPPRA ) 500 MG tablet Take by mouth.     levETIRAcetam  (KEPPRA ) 500 MG tablet Take  1 tablet (500 mg total) by mouth 2 (two) times daily. 60 tablet 3   metoprolol tartrate (LOPRESSOR) 25 MG tablet Take 25 mg by mouth daily.     mirtazapine  (REMERON ) 7.5 MG tablet TAKE ONE TABLET BY MOUTH AT BEDTIME. 30 tablet 5   nitroGLYCERIN  (NITROSTAT ) 0.4 MG SL tablet Place 1 tablet (0.4 mg total) under the tongue every 5 (five) minutes x 3 doses as needed for chest pain. 30 tablet 0   traMADol (ULTRAM) 50 MG tablet Take 50 mg by mouth 2 (two) times daily as needed.     triamcinolone  cream (KENALOG ) 0.5 % APPLY AS DIRECTED TO AFFECTED AREA TWICE DAILY AS NEEDED *MAY SELF APPLY AT BEDTIME* 30 g 0   vitamin B-12 (CYANOCOBALAMIN) 1000 MCG tablet Take 1 tablet (1,000 mcg total) by mouth daily. 30 tablet 3   No current facility-administered medications on file prior to visit.    No Known Allergies    Lab Results HIV 1 RNA Quant  Date Value  06/27/2023 39 copies/mL (H)  12/29/2022 <20 Copies/mL (H)  07/12/2022 34 Copies/mL (H)   CD4 T Cell Abs (/uL)  Date Value  06/27/2023 424  12/29/2022 382 (L)  07/12/2022 443   No results found for: HIV1GENOSEQ Lab Results  Component Value Date   WBC 5.9 06/27/2023   HGB  14.6 06/27/2023   HCT 43.0 06/27/2023   MCV 101.2 (H) 06/27/2023   PLT 194 06/27/2023    Lab Results  Component Value Date   CREATININE 1.51 (H) 06/27/2023   BUN 17 06/27/2023   NA 140 06/27/2023   K 4.5 06/27/2023   CL 106 06/27/2023   CO2 29 06/27/2023   Lab Results  Component Value Date   ALT 10 06/27/2023   AST 17 06/27/2023   ALKPHOS 68 03/30/2021   BILITOT 0.3 06/27/2023    Lab Results  Component Value Date   CHOL 103 06/27/2023   TRIG 74 06/27/2023   HDL 58 06/27/2023   LDLCALC 30 06/27/2023   No results found for: HAV Lab Results  Component Value Date   HEPBSAG NEGATIVE 04/29/2008   HEPBSAB NEGATIVE 04/29/2008   Lab Results  Component Value Date   HCVAB NEGATIVE 02/10/2014   Lab Results  Component Value Date   CHLAMYDIAWP Negative  03/20/2019   N Negative 03/20/2019   No results found for: GCPROBEAPT No results found for: QUANTGOLD  Assessment/Plan #HIV -CD4 424, VL39, on Biktarvy  -Labs today -F/u in 6 months with dr fleeta dam  #CKD -1.51 on 06/27/23 -Continue to monitor  #Vaccination States he has gotten flu and covid at facility, requested copy Monkeypox PCV utd Meningitis HepA serology HEpB non0immune(immunized) Tdap 2022F Shingles  #Health maintenance -Quantiferon-today -RPR-nr -HCV 06/28/22 -GC-today -Lipid The ASCVD Risk score (Arnett DK, et al., 2019) failed to calculate for the following reasons:   Risk score cannot be calculated because patient has a medical history suggesting prior/existing ASCVD   -Colonoscopy    [name], MD  I personally spent a total of 42 minutes in the care of the patient today including preparing to see the patient, getting/reviewing separately obtained history, performing a medically appropriate exam/evaluation, counseling and educating, placing orders, documenting clinical information in the EHR, independently interpreting results, and communicating results.  [facility]

## 2024-01-12 LAB — T-HELPER CELLS (CD4) COUNT (NOT AT ARMC)
CD4 % Helper T Cell: 32 % — ABNORMAL LOW (ref 33–65)
CD4 T Cell Abs: 356 /uL — ABNORMAL LOW (ref 400–1790)

## 2024-01-14 LAB — CBC WITH DIFFERENTIAL/PLATELET
Absolute Lymphocytes: 1193 {cells}/uL (ref 850–3900)
Absolute Monocytes: 281 {cells}/uL (ref 200–950)
Basophils Absolute: 32 {cells}/uL (ref 0–200)
Basophils Relative: 0.6 %
Eosinophils Absolute: 80 {cells}/uL (ref 15–500)
Eosinophils Relative: 1.5 %
HCT: 39.5 % (ref 38.5–50.0)
Hemoglobin: 13.6 g/dL (ref 13.2–17.1)
MCH: 34 pg — ABNORMAL HIGH (ref 27.0–33.0)
MCHC: 34.4 g/dL (ref 32.0–36.0)
MCV: 98.8 fL (ref 80.0–100.0)
MPV: 9.7 fL (ref 7.5–12.5)
Monocytes Relative: 5.3 %
Neutro Abs: 3715 {cells}/uL (ref 1500–7800)
Neutrophils Relative %: 70.1 %
Platelets: 192 Thousand/uL (ref 140–400)
RBC: 4 Million/uL — ABNORMAL LOW (ref 4.20–5.80)
RDW: 12.6 % (ref 11.0–15.0)
Total Lymphocyte: 22.5 %
WBC: 5.3 Thousand/uL (ref 3.8–10.8)

## 2024-01-14 LAB — COMPLETE METABOLIC PANEL WITHOUT GFR
AG Ratio: 2 (calc) (ref 1.0–2.5)
ALT: 18 U/L (ref 9–46)
AST: 27 U/L (ref 10–35)
Albumin: 4 g/dL (ref 3.6–5.1)
Alkaline phosphatase (APISO): 52 U/L (ref 35–144)
BUN/Creatinine Ratio: 11 (calc) (ref 6–22)
BUN: 15 mg/dL (ref 7–25)
CO2: 24 mmol/L (ref 20–32)
Calcium: 8.8 mg/dL (ref 8.6–10.3)
Chloride: 106 mmol/L (ref 98–110)
Creat: 1.38 mg/dL — ABNORMAL HIGH (ref 0.70–1.35)
Globulin: 2 g/dL (ref 1.9–3.7)
Glucose, Bld: 133 mg/dL — ABNORMAL HIGH (ref 65–99)
Potassium: 4 mmol/L (ref 3.5–5.3)
Sodium: 139 mmol/L (ref 135–146)
Total Bilirubin: 0.4 mg/dL (ref 0.2–1.2)
Total Protein: 6 g/dL — ABNORMAL LOW (ref 6.1–8.1)

## 2024-01-14 LAB — HEPATITIS A ANTIBODY, TOTAL: Hepatitis A AB,Total: REACTIVE — AB

## 2024-01-14 LAB — HIV-1 RNA QUANT-NO REFLEX-BLD
HIV 1 RNA Quant: 22 {copies}/mL — ABNORMAL HIGH
HIV-1 RNA Quant, Log: 1.34 {Log_copies}/mL — ABNORMAL HIGH

## 2024-01-14 LAB — QUANTIFERON-TB GOLD PLUS
Mitogen-NIL: 8.86 [IU]/mL
NIL: 0.03 [IU]/mL
QuantiFERON-TB Gold Plus: NEGATIVE
TB1-NIL: <0 [IU]/mL
TB2-NIL: <0 [IU]/mL

## 2024-03-19 ENCOUNTER — Emergency Department

## 2024-03-19 ENCOUNTER — Inpatient Hospital Stay
Admission: EM | Admit: 2024-03-19 | Discharge: 2024-03-22 | DRG: 975 | Disposition: A | Discharge status: Home / Self Care | Attending: Internal Medicine | Admitting: Internal Medicine

## 2024-03-19 ENCOUNTER — Other Ambulatory Visit: Payer: Self-pay

## 2024-03-19 DIAGNOSIS — Z7951 Long term (current) use of inhaled steroids: Secondary | ICD-10-CM

## 2024-03-19 DIAGNOSIS — R569 Unspecified convulsions: Secondary | ICD-10-CM | POA: Diagnosis present

## 2024-03-19 DIAGNOSIS — B20 Human immunodeficiency virus [HIV] disease: Secondary | ICD-10-CM | POA: Diagnosis present

## 2024-03-19 DIAGNOSIS — N179 Acute kidney failure, unspecified: Secondary | ICD-10-CM | POA: Diagnosis present

## 2024-03-19 DIAGNOSIS — J449 Chronic obstructive pulmonary disease, unspecified: Secondary | ICD-10-CM | POA: Diagnosis present

## 2024-03-19 DIAGNOSIS — R6520 Severe sepsis without septic shock: Secondary | ICD-10-CM | POA: Diagnosis present

## 2024-03-19 DIAGNOSIS — I959 Hypotension, unspecified: Secondary | ICD-10-CM | POA: Diagnosis present

## 2024-03-19 DIAGNOSIS — F1020 Alcohol dependence, uncomplicated: Secondary | ICD-10-CM | POA: Diagnosis present

## 2024-03-19 DIAGNOSIS — J440 Chronic obstructive pulmonary disease with acute lower respiratory infection: Secondary | ICD-10-CM | POA: Diagnosis present

## 2024-03-19 DIAGNOSIS — F0280 Dementia in other diseases classified elsewhere without behavioral disturbance: Secondary | ICD-10-CM | POA: Diagnosis present

## 2024-03-19 DIAGNOSIS — Z716 Tobacco abuse counseling: Secondary | ICD-10-CM

## 2024-03-19 DIAGNOSIS — E785 Hyperlipidemia, unspecified: Secondary | ICD-10-CM | POA: Diagnosis present

## 2024-03-19 DIAGNOSIS — R7401 Elevation of levels of liver transaminase levels: Secondary | ICD-10-CM | POA: Diagnosis present

## 2024-03-19 DIAGNOSIS — I2510 Atherosclerotic heart disease of native coronary artery without angina pectoris: Secondary | ICD-10-CM | POA: Diagnosis present

## 2024-03-19 DIAGNOSIS — A419 Sepsis, unspecified organism: Secondary | ICD-10-CM | POA: Diagnosis not present

## 2024-03-19 DIAGNOSIS — F17210 Nicotine dependence, cigarettes, uncomplicated: Secondary | ICD-10-CM | POA: Diagnosis present

## 2024-03-19 DIAGNOSIS — Z79899 Other long term (current) drug therapy: Secondary | ICD-10-CM

## 2024-03-19 DIAGNOSIS — E872 Acidosis, unspecified: Secondary | ICD-10-CM | POA: Diagnosis present

## 2024-03-19 DIAGNOSIS — N1831 Chronic kidney disease, stage 3a: Secondary | ICD-10-CM | POA: Diagnosis present

## 2024-03-19 DIAGNOSIS — J189 Pneumonia, unspecified organism: Secondary | ICD-10-CM | POA: Diagnosis present

## 2024-03-19 DIAGNOSIS — F17200 Nicotine dependence, unspecified, uncomplicated: Secondary | ICD-10-CM | POA: Diagnosis present

## 2024-03-19 LAB — HEPATIC FUNCTION PANEL
ALT: 107 U/L — ABNORMAL HIGH (ref 0–44)
AST: 102 U/L — ABNORMAL HIGH (ref 15–41)
Albumin: 2.8 g/dL — ABNORMAL LOW (ref 3.5–5.0)
Alkaline Phosphatase: 142 U/L — ABNORMAL HIGH (ref 38–126)
Bilirubin, Direct: 0.4 mg/dL — ABNORMAL HIGH (ref 0.0–0.2)
Indirect Bilirubin: 0.5 mg/dL (ref 0.3–0.9)
Total Bilirubin: 0.9 mg/dL (ref 0.0–1.2)
Total Protein: 8 g/dL (ref 6.5–8.1)

## 2024-03-19 LAB — BASIC METABOLIC PANEL WITH GFR
Anion gap: 11 (ref 5–15)
BUN: 33 mg/dL — ABNORMAL HIGH (ref 8–23)
CO2: 22 mmol/L (ref 22–32)
Calcium: 9.3 mg/dL (ref 8.9–10.3)
Chloride: 108 mmol/L (ref 98–111)
Creatinine, Ser: 1.7 mg/dL — ABNORMAL HIGH (ref 0.61–1.24)
GFR, Estimated: 44 mL/min — ABNORMAL LOW
Glucose, Bld: 139 mg/dL — ABNORMAL HIGH (ref 70–99)
Potassium: 3.7 mmol/L (ref 3.5–5.1)
Sodium: 141 mmol/L (ref 135–145)

## 2024-03-19 LAB — CBC
HCT: 34.2 % — ABNORMAL LOW (ref 39.0–52.0)
Hemoglobin: 11.7 g/dL — ABNORMAL LOW (ref 13.0–17.0)
MCH: 33.1 pg (ref 26.0–34.0)
MCHC: 34.2 g/dL (ref 30.0–36.0)
MCV: 96.6 fL (ref 80.0–100.0)
Platelets: 375 K/uL (ref 150–400)
RBC: 3.54 MIL/uL — ABNORMAL LOW (ref 4.22–5.81)
RDW: 15 % (ref 11.5–15.5)
WBC: 23.2 K/uL — ABNORMAL HIGH (ref 4.0–10.5)
nRBC: 0 % (ref 0.0–0.2)

## 2024-03-19 LAB — PROCALCITONIN: Procalcitonin: 1.51 ng/mL

## 2024-03-19 LAB — LACTIC ACID, PLASMA
Lactic Acid, Venous: 2.9 mmol/L (ref 0.5–1.9)
Lactic Acid, Venous: 1.4 mmol/L (ref 0.5–1.9)
Lactic Acid, Venous: 1.2 mmol/L (ref 0.5–1.9)

## 2024-03-19 LAB — LIPASE, BLOOD: Lipase: 38 U/L (ref 11–51)

## 2024-03-19 MED ORDER — CYANOCOBALAMIN 500 MCG PO TABS
1000.0000 ug | ORAL_TABLET | Freq: Every day | ORAL | Status: DC
  Administered 2024-03-20 – 2024-03-22 (×3): 1000 ug via ORAL
  Filled 2024-03-19 (×3): qty 2

## 2024-03-19 MED ORDER — LACTATED RINGERS IV SOLN
150.0000 mL/h | INTRAVENOUS | Status: DC
  Administered 2024-03-19: 150 mL/h via INTRAVENOUS

## 2024-03-19 MED ORDER — SODIUM CHLORIDE 0.9 % IV SOLN: 500.0000 mg | INTRAVENOUS | Status: DC

## 2024-03-19 MED ORDER — DOXYCYCLINE HYCLATE 100 MG PO TABS
100.0000 mg | ORAL_TABLET | Freq: Two times a day (BID) | ORAL | Status: DC
  Administered 2024-03-20 – 2024-03-22 (×5): 100 mg via ORAL
  Filled 2024-03-19 (×5): qty 1

## 2024-03-19 MED ORDER — ONDANSETRON HCL 4 MG PO TABS: 4.0000 mg | ORAL_TABLET | Freq: Four times a day (QID) | ORAL | Status: DC | PRN

## 2024-03-19 MED ORDER — ENOXAPARIN SODIUM 40 MG/0.4ML IJ SOSY
40.0000 mg | PREFILLED_SYRINGE | INTRAMUSCULAR | Status: DC
  Administered 2024-03-19 – 2024-03-21 (×3): 40 mg via SUBCUTANEOUS
  Filled 2024-03-19 (×3): qty 0.4

## 2024-03-19 MED ORDER — BICTEGRAVIR-EMTRICITAB-TENOFOV 50-200-25 MG PO TABS
1.0000 | ORAL_TABLET | Freq: Every day | ORAL | Status: DC
  Administered 2024-03-20 – 2024-03-22 (×3): 1 via ORAL
  Filled 2024-03-19 (×3): qty 1

## 2024-03-19 MED ORDER — LACTATED RINGERS IV BOLUS
1000.0000 mL | Freq: Once | INTRAVENOUS | Status: AC
  Administered 2024-03-19: 1000 mL via INTRAVENOUS

## 2024-03-19 MED ORDER — LORATADINE 10 MG PO TABS
10.0000 mg | ORAL_TABLET | Freq: Every day | ORAL | Status: DC
  Administered 2024-03-20 – 2024-03-22 (×3): 10 mg via ORAL
  Filled 2024-03-19 (×3): qty 1

## 2024-03-19 MED ORDER — SODIUM CHLORIDE 0.9 % IV BOLUS
1000.0000 mL | Freq: Once | INTRAVENOUS | Status: AC
  Administered 2024-03-19: 1000 mL via INTRAVENOUS

## 2024-03-19 MED ORDER — METOPROLOL TARTRATE 25 MG PO TABS: 25.0000 mg | ORAL_TABLET | Freq: Every day | ORAL | Status: DC

## 2024-03-19 MED ORDER — SODIUM CHLORIDE 0.9 % IV SOLN
2.0000 g | Freq: Once | INTRAVENOUS | Status: AC
  Administered 2024-03-19: 2 g via INTRAVENOUS
  Filled 2024-03-19: qty 20

## 2024-03-19 MED ORDER — SODIUM CHLORIDE 0.9 % IV SOLN
2.0000 g | INTRAVENOUS | Status: DC
  Administered 2024-03-20 – 2024-03-21 (×2): 2 g via INTRAVENOUS
  Filled 2024-03-19 (×2): qty 20

## 2024-03-19 MED ORDER — FOLIC ACID 1 MG PO TABS
1.0000 mg | ORAL_TABLET | Freq: Every day | ORAL | Status: DC
  Administered 2024-03-20 – 2024-03-22 (×3): 1 mg via ORAL
  Filled 2024-03-19 (×3): qty 1

## 2024-03-19 MED ORDER — ONDANSETRON HCL 4 MG/2ML IJ SOLN: 4.0000 mg | Freq: Four times a day (QID) | INTRAMUSCULAR | Status: DC | PRN

## 2024-03-19 MED ORDER — ASPIRIN 81 MG PO TBEC
81.0000 mg | DELAYED_RELEASE_TABLET | Freq: Every day | ORAL | Status: DC
  Administered 2024-03-20 – 2024-03-22 (×3): 81 mg via ORAL
  Filled 2024-03-19 (×3): qty 1

## 2024-03-19 MED ORDER — SODIUM CHLORIDE 0.9 % IV SOLN
100.0000 mg | Freq: Once | INTRAVENOUS | Status: AC
  Administered 2024-03-19: 100 mg via INTRAVENOUS
  Filled 2024-03-19 (×2): qty 100

## 2024-03-19 MED ORDER — MIRTAZAPINE 15 MG PO TABS
7.5000 mg | ORAL_TABLET | Freq: Every day | ORAL | Status: DC
  Administered 2024-03-20 – 2024-03-21 (×2): 7.5 mg via ORAL
  Filled 2024-03-19 (×2): qty 1

## 2024-03-19 MED ORDER — NITROGLYCERIN 0.4 MG SL SUBL: 0.4000 mg | SUBLINGUAL_TABLET | SUBLINGUAL | Status: DC | PRN

## 2024-03-19 MED ORDER — FLUTICASONE FUROATE-VILANTEROL 200-25 MCG/ACT IN AEPB
1.0000 | INHALATION_SPRAY | Freq: Every day | RESPIRATORY_TRACT | Status: DC
  Administered 2024-03-20 – 2024-03-22 (×3): 1 via RESPIRATORY_TRACT
  Filled 2024-03-19 (×2): qty 28

## 2024-03-19 MED ORDER — ACETAMINOPHEN 325 MG PO TABS: 650.0000 mg | ORAL_TABLET | Freq: Three times a day (TID) | ORAL | Status: DC | PRN

## 2024-03-19 MED ORDER — LEVETIRACETAM 500 MG PO TABS
500.0000 mg | ORAL_TABLET | Freq: Two times a day (BID) | ORAL | Status: DC
  Administered 2024-03-20 – 2024-03-22 (×5): 500 mg via ORAL
  Filled 2024-03-19 (×5): qty 1

## 2024-03-19 NOTE — Assessment & Plan Note (Addendum)
 Stable and not acutely exacerbated Continue inhaled steroids As needed bronchodilator therapy

## 2024-03-19 NOTE — Assessment & Plan Note (Signed)
 As evidenced by marked leukocytosis, hypotension, lactic acidosis, tachypnea and tachycardia present on admission with source of sepsis being left lower lobe pneumonia Patient was hypotensive upon arrival and responded to IV fluid resuscitation Continue aggressive IV fluid resuscitation Continue empiric antibiotic therapy with Rocephin  and Zithromax Follow-up results of blood cultures

## 2024-03-19 NOTE — H&P (Signed)
 " History and Physical    Patient: Brian Marquez FMW:[id number] DOB: Jul 02, 1960 DOA: 03/19/2024 DOS: the patient was seen and examined on 03/19/2024 PCP: Patient, No Pcp Per  Patient coming from: Group home  Chief Complaint:  Chief Complaint  Patient presents with   Hypotension   HPI: Brian Marquez is a 64 y.o. male with medical history significant on Biktarvy , history of stage IIIa chronic kidney disease, hyperlipidemia, seizures on keppra , nicotine  dependence, seasonal allergies and neuropathy who presents to the emergency room via EMS for evaluation of altered mental status. Per EMS notes, patient was oriented to person place and time but was hypotensive in the field (there was no documentation of blood pressure) but he received 500 mL of normal saline and route to the hospital.  Upon arrival in the ER blood pressure was 96/78. He is a poor historian but complains of a cough that is nonproductive as well as generalized weakness.  He denies having any fever or chills, denies having any nausea, no vomiting, no abdominal pain, no changes in his bowel habits, no dizziness, no lightheadedness, no headache, no blurred vision, no focal deficit. Abnormal labs include lactate of 2.9, BUN 33, creatinine 1.7, white count 23.2, hemoglobin 11.7, procalcitonin 1.5 Chest x-ray reviewed by me shows left lower lobe airspace opacity is noted concerning for possible pneumonia. Followup PA and lateral chest X-ray is recommended in 3-4 weeks following trial of antibiotic therapy to ensure resolution and exclude underlying malignancy. Twelve-lead EKG reviewed by me shows sinus rhythm with PACs Patient received sepsis fluid requirement as well as a dose of doxycycline  and Rocephin . Admission has been requested for further evaluation     Review of Systems: As mentioned in the history of present illness. All other systems reviewed and are negative. 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 No pertinent surgical history. Social History:  reports that he has been smoking cigarettes. He has never used smokeless tobacco. He reports that he does not currently use alcohol. He reports that he does not use drugs.  Allergies[1]  History reviewed. No pertinent family history.  Prior to Admission medications  Medication Sig Start Date End Date Taking? Authorizing Provider  acetaminophen  (TYLENOL ) 650 MG CR tablet Take 650 mg by mouth every 8 (eight) hours as needed for pain.    [provider]  atorvastatin  (LIPITOR) 40 MG tablet TAKE ONE TABLET BY MOUTH EVERY EVENING 01/01/24   Fleeta Rothman, Jomarie SAILOR, MD  bictegravir-emtricitabine -tenofovir  AF (BIKTARVY ) 50-200-25 MG TABS tablet Take 1 tablet by mouth daily. 06/27/23   Fleeta Rothman Jomarie SAILOR, MD  disulfiram  (ANTABUSE ) 250 MG tablet Take 500 mg by mouth daily.    [provider]  fexofenadine  (ALLEGRA ) 180 MG tablet TAKE 1 TABLET BY MOUTH ONCE DAILY. 08/04/20   Fleeta Rothman Jomarie SAILOR, MD  Fluticasone -Salmeterol (ADVAIR) 250-50 MCG/DOSE AEPB Inhale 1 puff into the lungs every 12 (twelve) hours.    [provider]  folic acid  (FOLVITE ) 1 MG tablet Take 1 mg by mouth daily.    [provider]  gabapentin  (NEURONTIN ) 300 MG capsule  TAKE 1 CAPSULE BY MOUTH AT BEDTIME 03/20/18   Fleeta Rothman, Jomarie SAILOR, MD  [facility] ASPIRIN  LOW DOSE 81 MG EC tablet TAKE 1 TABLET BY MOUTH ONCE DAILY. 11/25/20   Fleeta Rothman Jomarie SAILOR, MD  hydrOXYzine (VISTARIL) 25 MG capsule Take 25 mg by mouth 2 (two) times daily. 05/10/21   [provider]  levETIRAcetam  (KEPPRA ) 500 MG tablet Take by mouth. 11/25/20    [provider]  levETIRAcetam  (KEPPRA ) 500 MG tablet Take 1 tablet (500 mg total) by mouth 2 (two) times daily. 03/30/21   Malinda, Kaide F, MD  metoprolol  tartrate (LOPRESSOR ) 25 MG tablet Take 25 mg by mouth daily. 05/09/20   [provider]  mirtazapine  (REMERON ) 7.5 MG tablet TAKE ONE TABLET BY MOUTH AT BEDTIME. 03/20/18   Fleeta Rothman, Jomarie SAILOR, MD  nitroGLYCERIN  (NITROSTAT ) 0.4 MG SL tablet Place 1 tablet (0.4 mg total) under the tongue every 5 (five) minutes x 3 doses as needed for chest pain. 08/20/15   Sherial Bail, MD  traMADol (ULTRAM) 50 MG tablet Take 50 mg by mouth 2 (two) times daily as needed. 05/11/20   [provider]  triamcinolone  cream (KENALOG ) 0.5 % APPLY AS DIRECTED TO AFFECTED AREA TWICE DAILY AS NEEDED *MAY SELF APPLY AT BEDTIME* 08/12/19   Fleeta Rothman, Jomarie SAILOR, MD  vitamin B-12 (CYANOCOBALAMIN ) 1000 MCG tablet Take 1 tablet (1,000 mcg total) by mouth daily. 03/30/21   Alain Deward FALCON, MD    Physical Exam: Vitals:   03/19/24 1328  BP: 96/78  Pulse: 97  Resp: 18  Temp: 98 F (36.7 C)  TempSrc: Oral  SpO2: 93%  Weight: 56.7 kg  Height: 5' 11 (1.803 m)   Physical Exam Vitals and nursing note reviewed.  Constitutional:      Comments: Chronically ill-appearing.  Looks older than stated age  HENT:     Head: Normocephalic and atraumatic.     Nose: Nose normal.     Mouth/Throat:     Mouth: Mucous membranes are dry.  Eyes:     Comments: Pale conjunctiva  Cardiovascular:     Rate and Rhythm: Normal rate and regular rhythm.  Pulmonary:     Effort: Pulmonary effort is normal.     Breath sounds: Rhonchi present.     Comments: Rhonchi left base Abdominal:     General: Abdomen is flat. Bowel sounds are normal.     Palpations: Abdomen is soft.  Musculoskeletal:        General: Normal range of motion.     Cervical back: Normal range of motion and neck supple.  Skin:    General: Skin is warm and dry.  Neurological:     Mental Status:  He is alert and oriented to person, place, and time.  Psychiatric:        Mood and Affect: Mood normal.        Behavior: Behavior normal.     Data Reviewed: Data Reviewed: Relevant notes from primary care and specialist visits, past discharge summaries as available in EHR, including Care Everywhere. Prior diagnostic testing as pertinent to current admission diagnoses Updated medications and problem lists for reconciliation ED course, including vitals, labs, imaging, treatment and response to treatment Triage notes, nursing and pharmacy notes and ED provider's notes Notable results as noted in HPI Labs reviewed.  Total protein 8.0, albumin 2.8, AST 102, ALT 107, alkaline phosphatase 142, total bilirubin 0.9, lipase 38, procalcitonin 1.5, lactic acid 2.9, sodium 141, potassium 3.7, chloride 108, bicarb  22, glucose 139, BUN 33, creatinine 1.70, calcium  9.3, white count 23.2, hemoglobin 11.7, hematocrit 34.2, platelet count 375 Labs reviewed  Assessment and Plan: * Sepsis due to pneumonia (HCC) As evidenced by marked leukocytosis, hypotension, lactic acidosis, tachypnea and tachycardia present on admission with source of sepsis being left lower lobe pneumonia Patient was hypotensive upon arrival and responded to IV fluid resuscitation Continue aggressive IV fluid resuscitation Continue empiric antibiotic therapy with Rocephin  and Zithromax Follow-up results of blood cultures  CAD (coronary artery disease) Continue aspirin  Hold metoprolol  due to hypotension Hold statins due to transaminitis  Transaminitis Most likely related to statins Hold statins for now Monitor liver enzymes  CKD stage 3a, GFR 45-59 ml/min (HCC) Renal function is stable Monitor closely during this hospitalization  Smoker Smoking cessation has been discussed with patient in detail He declines a nicotine  transdermal patch at this time  Seizure [facility]) Seizure precautions Continue Keppra   COPD, moderate  (HCC) Stable and not acutely exacerbated Continue inhaled steroids As needed bronchodilator therapy  Human immunodeficiency virus (HIV) disease (HCC) Continue Biktarvy  CD4 count greater than 400 from last visit to infectious disease in 11/25      Advance Care Planning:   Code Status: Full Code   Consults: None  Family Communication: Plan of care was discussed with patient in detail.  He verbalizes understanding and agrees with the plan.  Severity of Illness: The appropriate patient status for this patient is OBSERVATION. Observation status is judged to be reasonable and necessary in order to provide the required intensity of service to ensure the patient's safety. The patient's presenting symptoms, physical exam findings, and initial radiographic and laboratory data in the context of their medical condition is felt to place them at decreased risk for further clinical deterioration. Furthermore, it is anticipated that the patient will be medically stable for discharge from the hospital within 2 midnights of admission.   Author: Aimee Somerset, MD 03/19/2024 6:01 PM  For on call review [email or web address].      [1] No Known Allergies  "

## 2024-03-19 NOTE — ED Provider Notes (Signed)
 "  [facility] Provider Note    Event Date/Time   First MD Initiated Contact with Patient 03/19/24 1527     (approximate)   History   Hypotension   HPI  Brian Marquez is a 64 y.o. male who presents to the ED for evaluation of Hypotension   I review ID clinic visit from November.  History of HIV on Biktarvy , CKD, HLD, seizures on Keppra , alcohol abuse, COPD  Patient presents to the ED due to cough, generalized weakness.  Nonfocal confusion.  Initially hypotensive with EMS, improving with IV fluids.   Physical Exam   Triage Vital Signs: ED Triage Vitals [03/19/24 1328]  Encounter Vitals Group     BP 96/78     Girls Systolic BP Percentile      Girls Diastolic BP Percentile      Boys Systolic BP Percentile      Boys Diastolic BP Percentile      Pulse Rate 97     Resp 18     Temp 98 F (36.7 C)     Temp Source Oral     SpO2 93 %     Weight 125 lb (56.7 kg)     Height 5' 11 (1.803 m)     Head Circumference      Peak Flow      Pain Score 0     Pain Loc      Pain Education      Exclude from Growth Chart     Most recent vital signs: Vitals:   03/19/24 1328  BP: 96/78  Pulse: 97  Resp: 18  Temp: 98 F (36.7 C)  SpO2: 93%    General: Awake, no distress.  CV:  Good peripheral perfusion.  Resp:  Normal effort.  Left basilar crackles, minimal wheezing throughout Abd:  No distention.  MSK:  No deformity noted.  Neuro:  No focal deficits appreciated. Other:     ED Results / Procedures / Treatments   Labs (all labs ordered are listed, but only abnormal results are displayed) Labs Reviewed  CBC - Abnormal; Notable for the following components:      Result Value   WBC 23.2 (*)    RBC 3.54 (*)    Hemoglobin 11.7 (*)    HCT 34.2 (*)    All other components within normal limits  BASIC METABOLIC PANEL WITH GFR - Abnormal; Notable for the following components:   Glucose, Bld 139 (*)    BUN 33 (*)    Creatinine, Ser 1.70 (*)    GFR,  Estimated 44 (*)    All other components within normal limits  HEPATIC FUNCTION PANEL - Abnormal; Notable for the following components:   Albumin 2.8 (*)    AST 102 (*)    ALT 107 (*)    Alkaline Phosphatase 142 (*)    Bilirubin, Direct 0.4 (*)    All other components within normal limits  LACTIC ACID, PLASMA - Abnormal; Notable for the following components:   Lactic Acid, Venous 2.9 (*)    All other components within normal limits  CULTURE, BLOOD (ROUTINE X 2)  CULTURE, BLOOD (ROUTINE X 2)  LIPASE, BLOOD  PROCALCITONIN  URINALYSIS, ROUTINE W REFLEX MICROSCOPIC    EKG Sinus rhythm with a rate of 96 bpm.  Normal axis and intervals.  No clear signs of acute ischemia.  RADIOLOGY 2 view CXR interpreted by me with left basilar infiltrate  Official radiology report(s): DG Chest 2 View  Result Date: 03/19/2024 CLINICAL DATA:  Altered mental status.  Cough. EXAM: CHEST - 2 VIEW COMPARISON:  March 30, 2021. FINDINGS: The heart size and mediastinal contours are within normal limits. Hyperinflation of the lungs is noted. Left lower lobe airspace opacity is noted concerning for possible pneumonia. The visualized skeletal structures are unremarkable. IMPRESSION: Left lower lobe airspace opacity is noted concerning for possible pneumonia. Followup PA and lateral chest X-ray is recommended in 3-4 weeks following trial of antibiotic therapy to ensure resolution and exclude underlying malignancy. Electronically Signed   By: Lynwood Landy Raddle M.D.   On: 03/19/2024 16:44    PROCEDURES and INTERVENTIONS:  .Critical Care  Performed by: Claudene Rover, MD Authorized by: Claudene Rover, MD   Critical care provider statement:    Critical care time (minutes):  30   Critical care time was exclusive of:  Separately billable procedures and treating other patients   Critical care was necessary to treat or prevent imminent or life-threatening deterioration of the following conditions:  Sepsis   Critical care  was time spent personally by me on the following activities:  Development of treatment plan with patient or surrogate, discussions with consultants, evaluation of patient's response to treatment, examination of patient, ordering and review of laboratory studies, ordering and review of radiographic studies, ordering and performing treatments and interventions, pulse oximetry, re-evaluation of patient's condition and review of old charts .1-3 Lead EKG Interpretation  Performed by: Claudene Rover, MD Authorized by: Claudene Rover, MD     Interpretation: normal     ECG rate:  94   ECG rate assessment: normal     Rhythm: sinus rhythm     Ectopy: none     Conduction: normal     Medications  cefTRIAXone  (ROCEPHIN ) 2 g in sodium chloride  0.9 % 100 mL IVPB (has no administration in time range)  sodium chloride  0.9 % bolus 1,000 mL (has no administration in time range)  doxycycline  (VIBRAMYCIN ) 100 mg in sodium chloride  0.9 % 250 mL IVPB (has no administration in time range)  lactated ringers  bolus 1,000 mL (1,000 mLs Intravenous New Bag/Given 03/19/24 1608)     IMPRESSION / MDM / ASSESSMENT AND PLAN / ED COURSE  I reviewed the triage vital signs and the nursing notes.  Differential diagnosis includes, but is not limited to, ICH, meningitis, sepsis, hypovolemia, AKI, COPD exacerbation, pneumothorax,  {Patient presents with symptoms of an acute illness or injury that is potentially life-threatening.  Patient presents to the ED with signs of sepsis from pneumonia requiring medical admission.  Borderline elevated heart rates and leukocytosis are SIRS criteria.  Mild renal dysfunction is noted.  Elevated procalcitonin and lactic acid.  Draw cultures, provide CAP coverage antibiotics and consult medicine for admission.  CXR with left basilar infiltrate is likely etiology of his symptoms.  Clinical Course as of 03/19/24 1746  Tue Mar 19, 2024  1652 Reassessed, BP remained stable, no signs of shock.   Discussed signs of pneumonia with the patient and my recommendation for admission.  He is agreeable [DS]    Clinical Course User Index [DS] Claudene Rover, MD     FINAL CLINICAL IMPRESSION(S) / ED DIAGNOSES   Final diagnoses:  Sepsis due to pneumonia [facility])     Rx / DC Orders   ED Discharge Orders     None        Note:  This document was prepared using Dragon voice recognition software and may include unintentional dictation errors.   Claudene,  Ester, MD 03/19/24 7676  "

## 2024-03-19 NOTE — Assessment & Plan Note (Signed)
Smoking cessation has been discussed with patient in detail He declines a nicotine transdermal patch at this time 

## 2024-03-19 NOTE — Assessment & Plan Note (Signed)
 Seizure precautions ?Continue Keppra ?

## 2024-03-19 NOTE — ED Notes (Signed)
 First Nurse note:  Per EMS they got called out for AMS. Pt is A/Ox4 per EMS. Pt initial was hypotensive but all VS are now WNL per EMS. Pt was given 500ml of NS via 18g in left hand.

## 2024-03-19 NOTE — ED Notes (Signed)
 Administrator of group home called with number Daphne Dade  [phone]

## 2024-03-19 NOTE — Assessment & Plan Note (Signed)
 Continue aspirin  Hold metoprolol  due to hypotension Hold statins due to transaminitis

## 2024-03-19 NOTE — Assessment & Plan Note (Signed)
Renal function is stable Monitor closely during this hospitalization  

## 2024-03-19 NOTE — Assessment & Plan Note (Signed)
 Continue Biktarvy  CD4 count greater than 400 from last visit to infectious disease in 11/25

## 2024-03-19 NOTE — Assessment & Plan Note (Signed)
 Most likely related to statins Hold statins for now Monitor liver enzymes

## 2024-03-20 DIAGNOSIS — A419 Sepsis, unspecified organism: Secondary | ICD-10-CM | POA: Diagnosis not present

## 2024-03-20 DIAGNOSIS — J189 Pneumonia, unspecified organism: Secondary | ICD-10-CM | POA: Diagnosis not present

## 2024-03-20 LAB — BASIC METABOLIC PANEL WITH GFR
Anion gap: 4 — ABNORMAL LOW (ref 5–15)
BUN: 24 mg/dL — ABNORMAL HIGH (ref 8–23)
CO2: 25 mmol/L (ref 22–32)
Calcium: 8.3 mg/dL — ABNORMAL LOW (ref 8.9–10.3)
Chloride: 112 mmol/L — ABNORMAL HIGH (ref 98–111)
Creatinine, Ser: 1.37 mg/dL — ABNORMAL HIGH (ref 0.61–1.24)
GFR, Estimated: 58 mL/min — ABNORMAL LOW
Glucose, Bld: 117 mg/dL — ABNORMAL HIGH (ref 70–99)
Potassium: 4 mmol/L (ref 3.5–5.1)
Sodium: 141 mmol/L (ref 135–145)

## 2024-03-20 LAB — BLOOD CULTURE ID PANEL (REFLEXED) - BCID2

## 2024-03-20 LAB — CBC
HCT: 27.8 % — ABNORMAL LOW (ref 39.0–52.0)
Hemoglobin: 9.9 g/dL — ABNORMAL LOW (ref 13.0–17.0)
MCH: 33.6 pg (ref 26.0–34.0)
MCHC: 35.6 g/dL (ref 30.0–36.0)
MCV: 94.2 fL (ref 80.0–100.0)
Platelets: 307 K/uL (ref 150–400)
RBC: 2.95 MIL/uL — ABNORMAL LOW (ref 4.22–5.81)
RDW: 14.6 % (ref 11.5–15.5)
WBC: 21.3 K/uL — ABNORMAL HIGH (ref 4.0–10.5)
nRBC: 0 % (ref 0.0–0.2)

## 2024-03-20 LAB — PROTIME-INR
INR: 1.3 — ABNORMAL HIGH (ref 0.8–1.2)
Prothrombin Time: 16.9 s — ABNORMAL HIGH (ref 11.4–15.2)

## 2024-03-20 LAB — CORTISOL-AM, BLOOD: Cortisol - AM: 18.6 ug/dL (ref 6.7–22.6)

## 2024-03-20 MED ORDER — LACTATED RINGERS IV BOLUS
1000.0000 mL | Freq: Once | INTRAVENOUS | Status: AC
  Administered 2024-03-20: 1000 mL via INTRAVENOUS

## 2024-03-20 MED ORDER — LACTATED RINGERS IV SOLN: INTRAVENOUS | Status: AC

## 2024-03-20 NOTE — Progress Notes (Signed)
 Mobility Specialist Progress Note:    03/20/24 0852  Mobility  Activity Refused and notified nurse if applicable   Pt kindly refused mobility, no specific reason at this time. Will re-attempt as appropriate, all needs met.  Sherrilee Ditty Mobility Specialist Please contact via Special Educational Needs Teacher or  Rehab office at [phone]

## 2024-03-20 NOTE — Progress Notes (Signed)
 Mobility Specialist Progress Note:    03/20/24 1058  Mobility  Activity Ambulated with assistance  Level of Assistance Contact guard assist, steadying assist  Assistive Device None  Distance Ambulated (ft) 20 ft  Range of Motion/Exercises Active;All extremities  Activity Response Tolerated well  Mobility visit 1 Mobility  Mobility Specialist Start Time (ACUTE ONLY) 1050  Mobility Specialist Stop Time (ACUTE ONLY) 1110  Mobility Specialist Time Calculation (min) (ACUTE ONLY) 20 min   Pt received requesting assistance. Required CGA to stand and ambulate with no AD. Tolerated well, asx throughout. Returned pt sitting EOB, alarm on and belongings in reach. All needs met.  Sherrilee Ditty Mobility Specialist Please contact via Special Educational Needs Teacher or  Rehab office at [phone]

## 2024-03-20 NOTE — Discharge Instructions (Signed)

## 2024-03-20 NOTE — Progress Notes (Signed)
 " PROGRESS NOTE    Brian Marquez  FMW:[id number] DOB: 1961/02/17 DOA: 03/19/2024 PCP: Patient, No Pcp Per    Brief Narrative:   63 y.o. male with medical history significant on Biktarvy , history of stage IIIa chronic kidney disease, hyperlipidemia, seizures on keppra , nicotine  dependence, seasonal allergies and neuropathy who presents to the emergency room via EMS for evaluation of altered mental status. Per EMS notes, patient was oriented to person place and time but was hypotensive in the field (there was no documentation of blood pressure) but he received 500 mL of normal saline and route to the hospital.  Upon arrival in the ER blood pressure was 96/78. He is a poor historian but complains of a cough that is nonproductive as well as generalized weakness.  He denies having any fever or chills, denies having any nausea, no vomiting, no abdominal pain, no changes in his bowel habits, no dizziness, no lightheadedness, no headache, no blurred vision, no focal deficit. Abnormal labs include lactate of 2.9, BUN 33, creatinine 1.7, white count 23.2, hemoglobin 11.7, procalcitonin 1.5 Chest x-ray reviewed by me shows left lower lobe airspace opacity is noted concerning for possible pneumonia. Followup PA and lateral chest X-ray is recommended in 3-4 weeks following trial of antibiotic therapy to ensure resolution and exclude underlying malignancy. Twelve-lead EKG reviewed by me shows sinus rhythm with PACs Patient received sepsis fluid requirement as well as a dose of doxycycline  and Rocephin . Admission has been requested for further evaluation   Assessment & Plan:   Principal Problem:   Sepsis due to pneumonia [facility]) Active Problems:   Human immunodeficiency virus (HIV) disease (HCC)   COPD, moderate (HCC)   Seizure (HCC)   Smoker   CKD stage 3a, GFR 45-59 ml/min (HCC)   Transaminitis   CAD (coronary artery disease)  * Sepsis due to pneumonia (HCC) As evidenced by marked leukocytosis,  hypotension, lactic acidosis, tachypnea and tachycardia present on admission with source of sepsis being left lower lobe pneumonia Patient was hypotensive upon arrival and responded to IV fluid resuscitation.  Marked leukocytosis very slowly improving Plan: Continue CAP treatment, Rocephin  and azithromycin Continue IV fluids, rate reduced Monitor fever curve and vitals   CAD (coronary artery disease) Continue aspirin  Continue holding metoprolol  Continue holding statin   Transaminitis Possibly related to statins Statin on hold for now   CKD stage 3a, GFR 45-59 ml/min (HCC) Renal function is stable Continue to monitor   Smoker Advised smoking cessation   Seizure (HCC) Seizure precautions Continue Keppra    COPD, moderate (HCC) Stable and not acutely exacerbated Continue inhaled steroids As needed bronchodilator therapy   Human immunodeficiency virus (HIV) disease (HCC) Continue Biktarvy  CD4 count greater than 400 from last visit to infectious disease in 11/25    DVT prophylaxis: Lovenox  Code Status: Full Family Communication: None Disposition Plan: Status is: Observation The patient will require care spanning > 2 midnights and should be moved to inpatient because: CAP on IV antibiotics.  Marked leukocytosis.  Anticipate discharge within 48 hours.   Level of care: Med-Surg  Consultants:  None  Procedures:  None  Antimicrobials: Ceftriaxone  Azithromycin   Subjective: Seen and examined.  Resting in bed.  Continues to endorse some cough and shortness of breath.  Not requiring oxygen at time of my evaluation.  Objective: Vitals:   03/20/24 0419 03/20/24 0420 03/20/24 0447 03/20/24 0842  BP: (!) 90/50 95/63 93/60  (!) 133/113  Pulse: 71 89 76 84  Resp: 19 19  16   Temp:  98.1 F (36.7 C) 98.1 F (36.7 C)  98.6 F (37 C)  TempSrc: Oral Oral    SpO2: 95% 98%  95%  Weight:      Height:        Intake/Output Summary (Last 24 hours) at 03/20/2024 1132 Last  data filed at 03/20/2024 0300 Gross per 24 hour  Intake 1032.27 ml  Output 500 ml  Net 532.27 ml   Filed Weights   03/19/24 1328  Weight: 56.7 kg    Examination:  General exam: Appears fatigued otherwise stable Respiratory system: Left-sided coarse crackles, right side diminished air entry.  Normal work of breathing.  Room air Cardiovascular system: S1-S2, RRR, no murmurs, no pedal edema Gastrointestinal system: Thin, soft, NT/ND, normal bowel sounds Central nervous system: Alert and oriented. No focal neurological deficits. Extremities: Symmetric 5 x 5 power. Skin: No rashes, lesions or ulcers Psychiatry: Judgement and insight appear normal. Mood & affect appropriate.     Data Reviewed: I have personally reviewed following labs and imaging studies  CBC: Recent Labs  Lab 03/19/24 1331 03/20/24 0555  WBC 23.2* 21.3*  HGB 11.7* 9.9*  HCT 34.2* 27.8*  MCV 96.6 94.2  PLT 375 307   Basic Metabolic Panel: Recent Labs  Lab 03/19/24 1331 03/20/24 0555  NA 141 141  K 3.7 4.0  CL 108 112*  CO2 22 25  GLUCOSE 139* 117*  BUN 33* 24*  CREATININE 1.70* 1.37*  CALCIUM  9.3 8.3*   GFR: Estimated Creatinine Clearance: 43.7 mL/min (A) (by C-G formula based on SCr of 1.37 mg/dL (H)). Liver Function Tests: Recent Labs  Lab 03/19/24 1553  AST 102*  ALT 107*  ALKPHOS 142*  BILITOT 0.9  PROT 8.0  ALBUMIN 2.8*   Recent Labs  Lab 03/19/24 1553  LIPASE 38   No results for input(s): AMMONIA in the last 168 hours. Coagulation Profile: Recent Labs  Lab 03/20/24 0555  INR 1.3*   Cardiac Enzymes: No results for input(s): CKTOTAL, CKMB, CKMBINDEX, TROPONINI in the last 168 hours. BNP (last 3 results) No results for input(s): PROBNP in the last 8760 hours. HbA1C: No results for input(s): HGBA1C in the last 72 hours. CBG: No results for input(s): GLUCAP in the last 168 hours. Lipid Profile: No results for input(s): CHOL, HDL, LDLCALC, TRIG,  CHOLHDL, LDLDIRECT in the last 72 hours. Thyroid  Function Tests: No results for input(s): TSH, T4TOTAL, FREET4, T3FREE, THYROIDAB in the last 72 hours. Anemia Panel: No results for input(s): VITAMINB12, FOLATE, FERRITIN, TIBC, IRON, RETICCTPCT in the last 72 hours. Sepsis Labs: Recent Labs  Lab 03/19/24 1553 03/19/24 1918 03/19/24 2213  PROCALCITON 1.51  --   --   LATICACIDVEN 2.9* 1.4 1.2    Recent Results (from the past 240 hours)  Blood culture (routine x 2)     Status: None (Preliminary result)   Collection Time: 03/19/24  3:53 PM   Specimen: BLOOD  Result Value Ref Range Status   Specimen Description BLOOD LEFT ANTECUBITAL  Final   Special Requests   Final    BOTTLES DRAWN AEROBIC AND ANAEROBIC Blood Culture results may not be optimal due to an inadequate volume of blood received in culture bottles   Culture   Final    NO GROWTH < 24 HOURS Performed at [facility], [address], [place], [place] [address]    Report Status PENDING  Incomplete  Blood culture (routine x 2)     Status: None (Preliminary result)   Collection Time: 03/19/24  4:10 PM   Specimen: BLOOD  Result Value Ref Range Status   Specimen Description BLOOD BLOOD LEFT ARM  Final   Special Requests   Final    BOTTLES DRAWN AEROBIC AND ANAEROBIC Blood Culture results may not be optimal due to an inadequate volume of blood received in culture bottles   Culture  Setup Time   Final    GRAM POSITIVE COCCI ANAEROBIC BOTTLE ONLY Organism ID to follow Performed at [facility], [address] [place], [place] [address]    Culture PENDING  Incomplete   Report Status PENDING  Incomplete         Radiology Studies: DG Chest 2 View Result Date: 03/19/2024 CLINICAL DATA:  Altered mental status.  Cough. EXAM: CHEST - 2 VIEW COMPARISON:  March 30, 2021. FINDINGS: The heart size and mediastinal contours are within normal limits. Hyperinflation of the lungs is  noted. Left lower lobe airspace opacity is noted concerning for possible pneumonia. The visualized skeletal structures are unremarkable. IMPRESSION: Left lower lobe airspace opacity is noted concerning for possible pneumonia. Followup PA and lateral chest X-ray is recommended in 3-4 weeks following trial of antibiotic therapy to ensure resolution and exclude underlying malignancy. Electronically Signed   By: Lynwood Landy Raddle M.D.   On: 03/19/2024 16:44        Scheduled Meds:  aspirin  EC  81 mg Oral Daily   bictegravir-emtricitabine -tenofovir  AF  1 tablet Oral Daily   cyanocobalamin   1,000 mcg Oral Daily   doxycycline   100 mg Oral Q12H   enoxaparin  (LOVENOX ) injection  40 mg Subcutaneous Q24H   fluticasone  furoate-vilanterol  1 puff Inhalation Daily   folic acid   1 mg Oral Daily   levETIRAcetam   500 mg Oral BID   loratadine   10 mg Oral Daily   mirtazapine   7.5 mg Oral QHS   Continuous Infusions:  cefTRIAXone  (ROCEPHIN )  IV     lactated ringers  100 mL/hr at 03/20/24 0941     LOS: 0 days     Calvin KATHEE Robson, MD [facility]   If 7PM-7AM, please contact night-coverage  03/20/2024, 11:32 AM   "

## 2024-03-20 NOTE — TOC CM/SW Note (Signed)
 Transition of Care [facility]) - Inpatient Brief Assessment   Patient Details  Name: Brian Marquez MRN: [id number] Date of Birth: 02/18/1961  Transition of Care [facility]) CM/SW Contact:    Corean ONEIDA Haddock, RN Phone Number: 03/20/2024, 10:24 AM   Clinical Narrative:   Transition of Care Department [facility]) has reviewed patient and no TOC needs have been identified at this time.  If new patient transition needs arise, please place a TOC consult.  List of local PCP added to AVS -Per SDOH transportation resources have been added to AVS Transition of Care Asessment: Insurance and Status: Insurance coverage has been reviewed Patient has primary care physician: No     Prior/Current Home Services: No current home services Social Drivers of Health Review: SDOH reviewed interventions complete Readmission risk has been reviewed: Yes Transition of care needs: no transition of care needs at this time

## 2024-03-20 NOTE — Progress Notes (Signed)
 PHARMACY - PHYSICIAN COMMUNICATION CRITICAL VALUE ALERT - BLOOD CULTURE IDENTIFICATION (BCID)  Brian Marquez is an 64 y.o. male who presented to [facility] on 03/19/2024 with a chief complaint of altered mental status  Assessment:  blood cultures from 1/20 with GPC in 1 of 4 bottles, BCID detects methicillin-susceptible S. Epidermidis.    Name of physician (or Provider) Contacted: Dr Jhonny  Current antibiotics: Ceftriaxone /doxycycline   Changes to prescribed antibiotics recommended:  Patient is on recommended antibiotics - No changes needed -suspect contaminant - monitor on current therapy  Results for orders placed or performed during the hospital encounter of 03/19/24  Blood Culture ID Panel (Reflexed) (Collected: 03/19/2024  4:10 PM)  Result Value Ref Range   Enterococcus faecalis NOT DETECTED NOT DETECTED   Enterococcus Faecium NOT DETECTED NOT DETECTED   Listeria monocytogenes NOT DETECTED NOT DETECTED   Staphylococcus species DETECTED (A) NOT DETECTED   Staphylococcus aureus (BCID) NOT DETECTED NOT DETECTED   Staphylococcus epidermidis DETECTED (A) NOT DETECTED   Staphylococcus lugdunensis NOT DETECTED NOT DETECTED   Streptococcus species NOT DETECTED NOT DETECTED   Streptococcus agalactiae NOT DETECTED NOT DETECTED   Streptococcus pneumoniae NOT DETECTED NOT DETECTED   Streptococcus pyogenes NOT DETECTED NOT DETECTED   A.calcoaceticus-baumannii NOT DETECTED NOT DETECTED   Bacteroides fragilis NOT DETECTED NOT DETECTED   Enterobacterales NOT DETECTED NOT DETECTED   Enterobacter cloacae complex NOT DETECTED NOT DETECTED   Escherichia coli NOT DETECTED NOT DETECTED   Klebsiella aerogenes NOT DETECTED NOT DETECTED   Klebsiella oxytoca NOT DETECTED NOT DETECTED   Klebsiella pneumoniae NOT DETECTED NOT DETECTED   Proteus species NOT DETECTED NOT DETECTED   Salmonella species NOT DETECTED NOT DETECTED   Serratia marcescens NOT DETECTED NOT DETECTED   Haemophilus influenzae  NOT DETECTED NOT DETECTED   Neisseria meningitidis NOT DETECTED NOT DETECTED   Pseudomonas aeruginosa NOT DETECTED NOT DETECTED   Stenotrophomonas maltophilia NOT DETECTED NOT DETECTED   Candida albicans NOT DETECTED NOT DETECTED   Candida auris NOT DETECTED NOT DETECTED   Candida glabrata NOT DETECTED NOT DETECTED   Candida krusei NOT DETECTED NOT DETECTED   Candida parapsilosis NOT DETECTED NOT DETECTED   Candida tropicalis NOT DETECTED NOT DETECTED   Cryptococcus neoformans/gattii NOT DETECTED NOT DETECTED   Methicillin resistance mecA/C NOT DETECTED NOT DETECTED    Celestine Slovak, PharmD, BCPS, BCIDP Work Cell: [phone] 03/20/2024 1:53 PM

## 2024-03-21 DIAGNOSIS — A419 Sepsis, unspecified organism: Secondary | ICD-10-CM | POA: Diagnosis not present

## 2024-03-21 DIAGNOSIS — J189 Pneumonia, unspecified organism: Secondary | ICD-10-CM | POA: Diagnosis not present

## 2024-03-21 LAB — CBC WITH DIFFERENTIAL/PLATELET
Abs Immature Granulocytes: 0.3 K/uL — ABNORMAL HIGH (ref 0.00–0.07)
Basophils Absolute: 0 K/uL (ref 0.0–0.1)
Basophils Relative: 0 %
Eosinophils Absolute: 0 K/uL (ref 0.0–0.5)
Eosinophils Relative: 0 %
HCT: 27.5 % — ABNORMAL LOW (ref 39.0–52.0)
Hemoglobin: 9.6 g/dL — ABNORMAL LOW (ref 13.0–17.0)
Immature Granulocytes: 2 %
Lymphocytes Relative: 6 %
Lymphs Abs: 1.1 K/uL (ref 0.7–4.0)
MCH: 33.2 pg (ref 26.0–34.0)
MCHC: 34.9 g/dL (ref 30.0–36.0)
MCV: 95.2 fL (ref 80.0–100.0)
Monocytes Absolute: 0.9 K/uL (ref 0.1–1.0)
Monocytes Relative: 5 %
Neutro Abs: 15.2 K/uL — ABNORMAL HIGH (ref 1.7–7.7)
Neutrophils Relative %: 87 %
Platelets: 314 K/uL (ref 150–400)
RBC: 2.89 MIL/uL — ABNORMAL LOW (ref 4.22–5.81)
RDW: 14.8 % (ref 11.5–15.5)
WBC: 17.5 K/uL — ABNORMAL HIGH (ref 4.0–10.5)
nRBC: 0 % (ref 0.0–0.2)

## 2024-03-21 LAB — BASIC METABOLIC PANEL WITH GFR
Anion gap: 7 (ref 5–15)
BUN: 20 mg/dL (ref 8–23)
CO2: 23 mmol/L (ref 22–32)
Calcium: 7.9 mg/dL — ABNORMAL LOW (ref 8.9–10.3)
Chloride: 108 mmol/L (ref 98–111)
Creatinine, Ser: 1.15 mg/dL (ref 0.61–1.24)
GFR, Estimated: >60 mL/min
Glucose, Bld: 111 mg/dL — ABNORMAL HIGH (ref 70–99)
Potassium: 3.4 mmol/L — ABNORMAL LOW (ref 3.5–5.1)
Sodium: 138 mmol/L (ref 135–145)

## 2024-03-21 MED ORDER — ADULT MULTIVITAMIN W/MINERALS CH
1.0000 | ORAL_TABLET | Freq: Every day | ORAL | Status: DC
  Administered 2024-03-22: 1 via ORAL
  Filled 2024-03-21: qty 1

## 2024-03-21 MED ORDER — ENSURE PLUS HIGH PROTEIN PO LIQD
237.0000 mL | Freq: Three times a day (TID) | ORAL | Status: DC
  Administered 2024-03-21 – 2024-03-22 (×3): 237 mL via ORAL

## 2024-03-21 MED ORDER — THIAMINE HCL 100 MG PO TABS
100.0000 mg | ORAL_TABLET | Freq: Every day | ORAL | Status: DC
  Administered 2024-03-22: 100 mg via ORAL
  Filled 2024-03-21 (×2): qty 1

## 2024-03-21 NOTE — TOC Initial Note (Signed)
 Transition of Care [facility]) - Initial/Assessment Note    Patient Details  Name: Brian Marquez MRN: [id number] Date of Birth: 1960/04/25  Transition of Care [facility]) CM/SW Contact:    Corean ONEIDA Haddock, RN Phone Number: 03/21/2024, 9:24 AM  Clinical Narrative:                  Patient admitted from [facility] home. Spoke with Counsellor at care home. She states at baseline patient is independent with mobility, and A&Ox3.  Currently patient A&O x1.  Will need Fl2 at DC, and Care home to provide transport at dc        Patient Goals and CMS Choice            Expected Discharge Plan and Services                                              Prior Living Arrangements/Services                       Activities of Daily Living   ADL Screening (condition at time of admission) Independently performs ADLs?: Yes (appropriate for developmental age) Is the patient deaf or have difficulty hearing?: No Does the patient have difficulty seeing, even when wearing glasses/contacts?: No Does the patient have difficulty concentrating, remembering, or making decisions?: No  Permission Sought/Granted                  Emotional Assessment              Admission diagnosis:  Sepsis (HCC) [A41.9] Sepsis due to pneumonia (HCC) [J18.9, A41.9] Patient Active Problem List   Diagnosis Date Noted   Sepsis (HCC) 03/20/2024   Sepsis due to pneumonia (HCC) 03/19/2024   CKD stage 3a, GFR 45-59 ml/min (HCC) 03/19/2024   Transaminitis 03/19/2024   CAD (coronary artery disease) 03/19/2024   Smoker 06/28/2022   Seizure (HCC) 12/01/2020   Hyperlipidemia 12/01/2020   Abscess of buttock, right 10/17/2019   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   NSTEMI (non-ST elevated myocardial infarction) (HCC) 08/19/2015   Alcohol abuse 03/31/2015   COPD, moderate (HCC) 11/25/2014   Alcoholism (HCC) 08/18/2014   HIV disease (HCC) 12/09/2013   HIV dementia  (HCC)    Poor dentition 10/26/2011   Broken teeth 10/26/2011   Renal insufficiency 01/24/2011   Encephalopathy 07/13/2010   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 [place] [address] Phone: [phone] Fax: [phone]     Social Drivers of Health (SDOH) Social History: SDOH Screenings   Food Insecurity: No Food Insecurity (03/19/2024)  Housing: Unknown (03/19/2024)  Transportation Needs: Unmet Transportation Needs (03/19/2024)  Utilities: Patient Declined (03/19/2024)  Depression (PHQ2-9): Low Risk (06/27/2023)  Social Connections: Patient Declined (03/19/2024)  Tobacco Use: High Risk (03/19/2024)   SDOH Interventions: Transportation Interventions: Other (Comment), Inpatient TOC (Resources added to AVS)   Readmission Risk Interventions     No data to display

## 2024-03-21 NOTE — Progress Notes (Signed)
 Mobility Specialist - Progress Note   03/21/24 1212  Mobility  Activity Ambulated with assistance  Level of Assistance Contact guard assist, steadying assist  Assistive Device None  Distance Ambulated (ft) 40 ft  Activity Response Tolerated well  Mobility visit 1 Mobility  Mobility Specialist Start Time (ACUTE ONLY) 1138  Mobility Specialist Stop Time (ACUTE ONLY) 1149  Mobility Specialist Time Calculation (min) (ACUTE ONLY) 11 min   Pt supine upon entry, utilizing RA. Pt completed bed mob MinG-SBA, amb ~ 40 ft in the hallway CGA-- unsteady, one LOB requiring MinA for correction. Pt returned to the room, left supine with alarm set and needs within reach.  America Silvan Mobility Specialist 03/21/24 12:15 PM

## 2024-03-21 NOTE — Progress Notes (Signed)
 " PROGRESS NOTE    BENJI POYNTER  FMW:[id number] DOB: Oct 23, 1960 DOA: 03/19/2024 PCP: Patient, No Pcp Per    Brief Narrative:   64 y.o. male with medical history significant on Biktarvy , history of stage IIIa chronic kidney disease, hyperlipidemia, seizures on keppra , nicotine  dependence, seasonal allergies and neuropathy who presents to the emergency room via EMS for evaluation of altered mental status. Per EMS notes, patient was oriented to person place and time but was hypotensive in the field (there was no documentation of blood pressure) but he received 500 mL of normal saline and route to the hospital.  Upon arrival in the ER blood pressure was 96/78. He is a poor historian but complains of a cough that is nonproductive as well as generalized weakness.  He denies having any fever or chills, denies having any nausea, no vomiting, no abdominal pain, no changes in his bowel habits, no dizziness, no lightheadedness, no headache, no blurred vision, no focal deficit. Abnormal labs include lactate of 2.9, BUN 33, creatinine 1.7, white count 23.2, hemoglobin 11.7, procalcitonin 1.5 Chest x-ray reviewed by me shows left lower lobe airspace opacity is noted concerning for possible pneumonia. Followup PA and lateral chest X-ray is recommended in 3-4 weeks following trial of antibiotic therapy to ensure resolution and exclude underlying malignancy. Twelve-lead EKG reviewed by me shows sinus rhythm with PACs Patient received sepsis fluid requirement as well as a dose of doxycycline  and Rocephin . Admission has been requested for further evaluation   Assessment & Plan:   Principal Problem:   Sepsis due to pneumonia [facility]) Active Problems:   Human immunodeficiency virus (HIV) disease (HCC)   COPD, moderate (HCC)   Seizure (HCC)   Smoker   CKD stage 3a, GFR 45-59 ml/min (HCC)   Transaminitis   CAD (coronary artery disease)   Sepsis (HCC)  * Sepsis due to pneumonia (HCC) As evidenced by marked  leukocytosis, hypotension, lactic acidosis, tachypnea and tachycardia present on admission with source of sepsis being left lower lobe pneumonia Patient was hypotensive upon arrival and responded to IV fluid resuscitation.  Marked leukocytosis improving Plan: Continue CAP treatment, Rocephin  and azithromycin DC IVF Monitor fever curve and vitals   CAD (coronary artery disease) Continue aspirin  Continue holding metoprolol  Continue holding statin   Transaminitis Possibly related to statins Hold statin  CKD stage 3a, GFR 45-59 ml/min (HCC) Renal function is stable DC IVF Continue to monitor   Smoker Advised smoking cessation   Seizure (HCC) Seizure precautions Continue Keppra    COPD, moderate (HCC) Stable and not acutely exacerbated Continue inhaled steroids As needed bronchodilator therapy   Human immunodeficiency virus (HIV) disease (HCC) Continue Biktarvy  CD4 count greater than 400 from last visit to infectious disease in 11/25    DVT prophylaxis: Lovenox  Code Status: Full Family Communication: None Disposition Plan: Status is: Inpatient Remains inpatient appropriate because: CAP on IV antibiotics.  Improving.  Anticipate discharge 1/23.     Level of care: Med-Surg  Consultants:  None  Procedures:  None  Antimicrobials: Ceftriaxone  Azithromycin   Subjective: Seen and examined.  Resting bed.  Cough and shortness of breath improved.  No oxygen requirement at this time.  Objective: Vitals:   03/20/24 1425 03/20/24 1937 03/21/24 0405 03/21/24 0734  BP: (!) 108/58 99/62 107/61 109/67  Pulse: (!) 59 98 68 69  Resp: 16 17 17 16   Temp: 98.1 F (36.7 C) 98.1 F (36.7 C) 99.2 F (37.3 C) 97.9 F (36.6 C)  TempSrc:  Oral Oral Oral  SpO2: 97% 95% 98% 99%  Weight:      Height:        Intake/Output Summary (Last 24 hours) at 03/21/2024 1101 Last data filed at 03/21/2024 1010 Gross per 24 hour  Intake 340 ml  Output --  Net 340 ml   Filed Weights    03/19/24 1328  Weight: 56.7 kg    Examination:  General exam: No acute distress Respiratory system: Left-sided crackles.  Right-sided diminished air entry.  Normal work of breathing.  Room air Cardiovascular system: S1-S2, RRR, no murmurs, no pedal edema Gastrointestinal system: Thin, soft, NT/ND, normal bowel sounds Central nervous system: Alert and oriented. No focal neurological deficits. Extremities: Symmetric 5 x 5 power. Skin: No rashes, lesions or ulcers Psychiatry: Judgement and insight appear normal. Mood & affect appropriate.     Data Reviewed: I have personally reviewed following labs and imaging studies  CBC: Recent Labs  Lab 03/19/24 1331 03/20/24 0555 03/21/24 0432  WBC 23.2* 21.3* 17.5*  NEUTROABS  --   --  15.2*  HGB 11.7* 9.9* 9.6*  HCT 34.2* 27.8* 27.5*  MCV 96.6 94.2 95.2  PLT 375 307 314   Basic Metabolic Panel: Recent Labs  Lab 03/19/24 1331 03/20/24 0555 03/21/24 0432  NA 141 141 138  K 3.7 4.0 3.4*  CL 108 112* 108  CO2 22 25 23   GLUCOSE 139* 117* 111*  BUN 33* 24* 20  CREATININE 1.70* 1.37* 1.15  CALCIUM  9.3 8.3* 7.9*   GFR: Estimated Creatinine Clearance: 52 mL/min (by C-G formula based on SCr of 1.15 mg/dL). Liver Function Tests: Recent Labs  Lab 03/19/24 1553  AST 102*  ALT 107*  ALKPHOS 142*  BILITOT 0.9  PROT 8.0  ALBUMIN 2.8*   Recent Labs  Lab 03/19/24 1553  LIPASE 38   No results for input(s): AMMONIA in the last 168 hours. Coagulation Profile: Recent Labs  Lab 03/20/24 0555  INR 1.3*   Cardiac Enzymes: No results for input(s): CKTOTAL, CKMB, CKMBINDEX, TROPONINI in the last 168 hours. BNP (last 3 results) No results for input(s): PROBNP in the last 8760 hours. HbA1C: No results for input(s): HGBA1C in the last 72 hours. CBG: No results for input(s): GLUCAP in the last 168 hours. Lipid Profile: No results for input(s): CHOL, HDL, LDLCALC, TRIG, CHOLHDL, LDLDIRECT in the last  72 hours. Thyroid  Function Tests: No results for input(s): TSH, T4TOTAL, FREET4, T3FREE, THYROIDAB in the last 72 hours. Anemia Panel: No results for input(s): VITAMINB12, FOLATE, FERRITIN, TIBC, IRON, RETICCTPCT in the last 72 hours. Sepsis Labs: Recent Labs  Lab 03/19/24 1553 03/19/24 1918 03/19/24 2213  PROCALCITON 1.51  --   --   LATICACIDVEN 2.9* 1.4 1.2    Recent Results (from the past 240 hours)  Blood culture (routine x 2)     Status: None (Preliminary result)   Collection Time: 03/19/24  3:53 PM   Specimen: BLOOD  Result Value Ref Range Status   Specimen Description BLOOD LEFT ANTECUBITAL  Final   Special Requests   Final    BOTTLES DRAWN AEROBIC AND ANAEROBIC Blood Culture results may not be optimal due to an inadequate volume of blood received in culture bottles   Culture   Final    NO GROWTH 2 DAYS Performed at [facility], [address] [place], [place] [address]    Report Status PENDING  Incomplete  Blood culture (routine x 2)     Status: None (Preliminary result)   Collection Time: 03/19/24  4:10 PM   Specimen: BLOOD  Result Value Ref Range Status   Specimen Description   Final    BLOOD BLOOD LEFT ARM Performed at [facility], [address] [place], [place] [address]    Special Requests   Final    BOTTLES DRAWN AEROBIC AND ANAEROBIC Blood Culture results may not be optimal due to an inadequate volume of blood received in culture bottles Performed at [facility], [address], [place], [place] [address]    Culture  Setup Time   Final    GRAM POSITIVE COCCI ANAEROBIC BOTTLE ONLY CRITICAL RESULT CALLED TO, READ BACK BY AND VERIFIED WITH: TREY GREENWOOD 03/20/24 1245 MW    Culture   Final    GRAM POSITIVE COCCI TOO YOUNG TO READ Performed at [facility], [address] [place], [place] [address]    Report Status PENDING  Incomplete  Blood Culture ID Panel (Reflexed)     Status: Abnormal    Collection Time: 03/19/24  4:10 PM  Result Value Ref Range Status   Enterococcus faecalis NOT DETECTED NOT DETECTED Final   Enterococcus Faecium NOT DETECTED NOT DETECTED Final   Listeria monocytogenes NOT DETECTED NOT DETECTED Final   Staphylococcus species DETECTED (A) NOT DETECTED Final    Comment: CRITICAL RESULT CALLED TO, READ BACK BY AND VERIFIED WITH: TREY GREENWOOD 03/20/24 1245 MW    Staphylococcus aureus (BCID) NOT DETECTED NOT DETECTED Final   Staphylococcus epidermidis DETECTED (A) NOT DETECTED Final    Comment: CRITICAL RESULT CALLED TO, READ BACK BY AND VERIFIED WITH: TREY GREENWOOD 03/20/24 1245 MW    Staphylococcus lugdunensis NOT DETECTED NOT DETECTED Final   Streptococcus species NOT DETECTED NOT DETECTED Final   Streptococcus agalactiae NOT DETECTED NOT DETECTED Final   Streptococcus pneumoniae NOT DETECTED NOT DETECTED Final   Streptococcus pyogenes NOT DETECTED NOT DETECTED Final   A.calcoaceticus-baumannii NOT DETECTED NOT DETECTED Final   Bacteroides fragilis NOT DETECTED NOT DETECTED Final   Enterobacterales NOT DETECTED NOT DETECTED Final   Enterobacter cloacae complex NOT DETECTED NOT DETECTED Final   Escherichia coli NOT DETECTED NOT DETECTED Final   Klebsiella aerogenes NOT DETECTED NOT DETECTED Final   Klebsiella oxytoca NOT DETECTED NOT DETECTED Final   Klebsiella pneumoniae NOT DETECTED NOT DETECTED Final   Proteus species NOT DETECTED NOT DETECTED Final   Salmonella species NOT DETECTED NOT DETECTED Final   Serratia marcescens NOT DETECTED NOT DETECTED Final   Haemophilus influenzae NOT DETECTED NOT DETECTED Final   Neisseria meningitidis NOT DETECTED NOT DETECTED Final   Pseudomonas aeruginosa NOT DETECTED NOT DETECTED Final   Stenotrophomonas maltophilia NOT DETECTED NOT DETECTED Final   Candida albicans NOT DETECTED NOT DETECTED Final   Candida auris NOT DETECTED NOT DETECTED Final   Candida glabrata NOT DETECTED NOT DETECTED Final   Candida  krusei NOT DETECTED NOT DETECTED Final   Candida parapsilosis NOT DETECTED NOT DETECTED Final   Candida tropicalis NOT DETECTED NOT DETECTED Final   Cryptococcus neoformans/gattii NOT DETECTED NOT DETECTED Final   Methicillin resistance mecA/C NOT DETECTED NOT DETECTED Final    Comment: Performed at [facility], [address] [place], [place] [address]         Radiology Studies: DG Chest 2 View Result Date: 03/19/2024 CLINICAL DATA:  Altered mental status.  Cough. EXAM: CHEST - 2 VIEW COMPARISON:  March 30, 2021. FINDINGS: The heart size and mediastinal contours are within normal limits. Hyperinflation of the lungs is noted. Left  lower lobe airspace opacity is noted concerning for possible pneumonia. The visualized skeletal structures are unremarkable. IMPRESSION: Left lower lobe airspace opacity is noted concerning for possible pneumonia. Followup PA and lateral chest X-ray is recommended in 3-4 weeks following trial of antibiotic therapy to ensure resolution and exclude underlying malignancy. Electronically Signed   By: Lynwood Landy Raddle M.D.   On: 03/19/2024 16:44        Scheduled Meds:  aspirin  EC  81 mg Oral Daily   bictegravir-emtricitabine -tenofovir  AF  1 tablet Oral Daily   cyanocobalamin   1,000 mcg Oral Daily   doxycycline   100 mg Oral Q12H   enoxaparin  (LOVENOX ) injection  40 mg Subcutaneous Q24H   fluticasone  furoate-vilanterol  1 puff Inhalation Daily   folic acid   1 mg Oral Daily   levETIRAcetam   500 mg Oral BID   loratadine   10 mg Oral Daily   mirtazapine   7.5 mg Oral QHS   Continuous Infusions:  cefTRIAXone  (ROCEPHIN )  IV 2 g (03/20/24 2001)     LOS: 1 day     Calvin KATHEE Robson, MD [facility]   If 7PM-7AM, please contact night-coverage  03/21/2024, 11:01 AM   "

## 2024-03-22 ENCOUNTER — Other Ambulatory Visit: Payer: Self-pay

## 2024-03-22 DIAGNOSIS — A419 Sepsis, unspecified organism: Secondary | ICD-10-CM | POA: Diagnosis not present

## 2024-03-22 DIAGNOSIS — J189 Pneumonia, unspecified organism: Secondary | ICD-10-CM | POA: Diagnosis not present

## 2024-03-22 LAB — BASIC METABOLIC PANEL WITH GFR
Anion gap: 7 (ref 5–15)
BUN: 20 mg/dL (ref 8–23)
CO2: 24 mmol/L (ref 22–32)
Calcium: 8.4 mg/dL — ABNORMAL LOW (ref 8.9–10.3)
Chloride: 111 mmol/L (ref 98–111)
Creatinine, Ser: 1.02 mg/dL (ref 0.61–1.24)
GFR, Estimated: >60 mL/min
Glucose, Bld: 141 mg/dL — ABNORMAL HIGH (ref 70–99)
Potassium: 3.5 mmol/L (ref 3.5–5.1)
Sodium: 142 mmol/L (ref 135–145)

## 2024-03-22 LAB — CBC WITH DIFFERENTIAL/PLATELET
Abs Immature Granulocytes: 0.14 K/uL — ABNORMAL HIGH (ref 0.00–0.07)
Basophils Absolute: 0 K/uL (ref 0.0–0.1)
Basophils Relative: 0 %
Eosinophils Absolute: 0 K/uL (ref 0.0–0.5)
Eosinophils Relative: 0 %
HCT: 28.1 % — ABNORMAL LOW (ref 39.0–52.0)
Hemoglobin: 9.8 g/dL — ABNORMAL LOW (ref 13.0–17.0)
Immature Granulocytes: 1 %
Lymphocytes Relative: 9 %
Lymphs Abs: 1.1 K/uL (ref 0.7–4.0)
MCH: 33.2 pg (ref 26.0–34.0)
MCHC: 34.9 g/dL (ref 30.0–36.0)
MCV: 95.3 fL (ref 80.0–100.0)
Monocytes Absolute: 0.6 K/uL (ref 0.1–1.0)
Monocytes Relative: 4 %
Neutro Abs: 11.3 K/uL — ABNORMAL HIGH (ref 1.7–7.7)
Neutrophils Relative %: 86 %
Platelets: 350 K/uL (ref 150–400)
RBC: 2.95 MIL/uL — ABNORMAL LOW (ref 4.22–5.81)
RDW: 14.6 % (ref 11.5–15.5)
WBC: 13.2 K/uL — ABNORMAL HIGH (ref 4.0–10.5)
nRBC: 0 % (ref 0.0–0.2)

## 2024-03-22 LAB — MAGNESIUM: Magnesium: 1.8 mg/dL (ref 1.7–2.4)

## 2024-03-22 LAB — PHOSPHORUS: Phosphorus: 2.4 mg/dL — ABNORMAL LOW (ref 2.5–4.6)

## 2024-03-22 MED ORDER — LEVOFLOXACIN 750 MG PO TABS
750.0000 mg | ORAL_TABLET | Freq: Every day | ORAL | 0 refills | Status: AC
  Filled 2024-03-22: qty 5, 5d supply, fill #0

## 2024-03-22 NOTE — Progress Notes (Signed)
 Report given to Daphne, RN at [facility]

## 2024-03-22 NOTE — NC FL2 (Signed)
 " [place]  MEDICAID FL2 LEVEL OF CARE FORM     IDENTIFICATION  Patient Name: Brian Marquez Birthdate: 1960-06-14 Sex: male Admission Date (Current Location): 03/19/2024  [facility] [place] and [place] Number:  Chiropodist and Address:         Provider Number: [id number]  Attending Physician Name and Address:  Jhonny Calvin NOVAK, MD  Relative Name and Phone Number:       Current Level of Care: Hospital Recommended Level of Care: [facility] Prior Approval Number:    Date Approved/Denied:   PASRR Number:    Discharge Plan: Other (Comment) (Family care home)    Current Diagnoses: Patient Active Problem List   Diagnosis Date Noted   Sepsis (HCC) 03/20/2024   Sepsis due to pneumonia (HCC) 03/19/2024   CKD stage 3a, GFR 45-59 ml/min (HCC) 03/19/2024   Transaminitis 03/19/2024   CAD (coronary artery disease) 03/19/2024   Smoker 06/28/2022   Seizure (HCC) 12/01/2020   Hyperlipidemia 12/01/2020   Abscess of buttock, right 10/17/2019   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   NSTEMI (non-ST elevated myocardial infarction) (HCC) 08/19/2015   Alcohol abuse 03/31/2015   COPD, moderate (HCC) 11/25/2014   Alcoholism (HCC) 08/18/2014   HIV disease (HCC) 12/09/2013   HIV dementia (HCC)    Poor dentition 10/26/2011   Broken teeth 10/26/2011   Renal insufficiency 01/24/2011   Encephalopathy 07/13/2010   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   Homelessness 07/13/2010   Polysubstance abuse (HCC) 07/13/2010   ROTATOR CUFF SYNDROME, LEFT 01/27/2009   Human immunodeficiency virus (HIV) disease (HCC) 06/04/2008   CRYPTOCOCCAL MENINGITIS 06/04/2008   CIGARETTE SMOKER 06/04/2008    Orientation RESPIRATION BLADDER Height & Weight     Self  Normal Continent Weight: 53.4 kg Height:  5' 11 (180.3 cm)  BEHAVIORAL SYMPTOMS/MOOD NEUROLOGICAL BOWEL NUTRITION STATUS      Continent Diet (regular)  AMBULATORY STATUS COMMUNICATION OF  NEEDS Skin   Supervision Verbally Bruising                       Personal Care Assistance Level of Assistance              Functional Limitations Info             SPECIAL CARE FACTORS FREQUENCY                       Contractures Contractures Info: Not present    Additional Factors Info  Code Status, Allergies Code Status Info: Full Allergies Info: NKDA           Medication List       TAKE these medications     acetaminophen  650 MG CR tablet Commonly known as: TYLENOL  Take 1,300 mg by mouth 2 (two) times daily.    aspirin  EC 81 MG tablet Take 81 mg by mouth daily. Swallow whole.    atorvastatin  40 MG tablet Commonly known as: LIPITOR TAKE ONE TABLET BY MOUTH EVERY EVENING    Biktarvy  50-200-25 MG Tabs tablet Generic drug: bictegravir-emtricitabine -tenofovir  AF Take 1 tablet by mouth daily.    fexofenadine  180 MG tablet Commonly known as: ALLEGRA  Take 180 mg by mouth daily.    folic acid  1 MG tablet Commonly known as: FOLVITE  Take 1 mg by mouth daily.    hydrOXYzine 25 MG capsule Commonly known as: VISTARIL Take 25 mg by mouth 2 (two) times daily.    levETIRAcetam  500 MG tablet  Commonly known as: Keppra  Take 1 tablet (500 mg total) by mouth 2 (two) times daily.    levofloxacin  750 MG tablet Commonly known as: Levaquin  Take 1 tablet (750 mg total) by mouth daily for 5 days.    traMADol 50 MG tablet Commonly known as: ULTRAM Take 150 mg by mouth 2 (two) times daily as needed for moderate pain (pain score 4-6).    VITAMIN B50 COMPLEX PO Take 1 tablet by mouth daily.   Relevant Imaging Results:  Relevant Lab Results:   Additional Information    Corean ONEIDA Haddock, RN     "

## 2024-03-22 NOTE — Discharge Summary (Signed)
 Physician Discharge Summary  Brian Marquez FMW:[id number] DOB: 25-Dec-1960 DOA: 03/19/2024  PCP: Patient, No Pcp Per  Admit date: 03/19/2024 Discharge date: 03/22/2024  Admitted From: Home Disposition:  Home/care facility  Recommendations for Outpatient Follow-up:  Follow up with PCP in 1-2 weeks   Home Health:No  Equipment/Devices:NOne   Discharge Condition:Stable  CODE STATUS:FULL  Diet recommendation: Reg  Brief/Interim Summary:   64 y.o. male with medical history significant on Biktarvy , history of stage IIIa chronic kidney disease, hyperlipidemia, seizures on keppra , nicotine  dependence, seasonal allergies and neuropathy who presents to the emergency room via EMS for evaluation of altered mental status. Per EMS notes, patient was oriented to person place and time but was hypotensive in the field (there was no documentation of blood pressure) but he received 500 mL of normal saline and route to the hospital.  Upon arrival in the ER blood pressure was 96/78. He is a poor historian but complains of a cough that is nonproductive as well as generalized weakness.  He denies having any fever or chills, denies having any nausea, no vomiting, no abdominal pain, no changes in his bowel habits, no dizziness, no lightheadedness, no headache, no blurred vision, no focal deficit. Abnormal labs include lactate of 2.9, BUN 33, creatinine 1.7, white count 23.2, hemoglobin 11.7, procalcitonin 1.5 Chest x-ray reviewed by me shows left lower lobe airspace opacity is noted concerning for possible pneumonia. Followup PA and lateral chest X-ray is recommended in 3-4 weeks following trial of antibiotic therapy to ensure resolution and exclude underlying malignancy. Twelve-lead EKG reviewed by me shows sinus rhythm with PACs Patient received sepsis fluid requirement as well as a dose of doxycycline  and Rocephin . Admission has been requested for further evaluation    Discharge Diagnoses:  Principal  Problem:   Sepsis due to pneumonia [facility]) Active Problems:   Human immunodeficiency virus (HIV) disease (HCC)   COPD, moderate (HCC)   Seizure (HCC)   Smoker   CKD stage 3a, GFR 45-59 ml/min (HCC)   Transaminitis   CAD (coronary artery disease)   Sepsis (HCC)  * Sepsis due to pneumonia (HCC) As evidenced by marked leukocytosis, hypotension, lactic acidosis, tachypnea and tachycardia present on admission with source of sepsis being left lower lobe pneumonia Patient was hypotensive upon arrival and responded to IV fluid resuscitation.  Marked leukocytosis improving Plan: DC IV abx Start PO levaquin, additional 5 days to complete course Can return to previous place of residence   CAD (coronary artery disease) Continue aspirin  Resume metop and statin   Transaminitis Possibly related to statins Resume statin at dc   CKD stage 3a, GFR 45-59 ml/min (HCC) Renal function is stable    Smoker Advised smoking cessation   Seizure (HCC) Continue Keppra    COPD, moderate (HCC) Stable and not acutely exacerbated Continue home bronchodilators   Human immunodeficiency virus (HIV) disease (HCC) Continue Biktarvy  CD4 count greater than 400 from last visit to infectious disease in 11/25   Discharge Instructions  Discharge Instructions     Increase activity slowly   Complete by: As directed       Allergies as of 03/22/2024   No Known Allergies      Medication List     TAKE these medications    acetaminophen  650 MG CR tablet Commonly known as: TYLENOL  Take 1,300 mg by mouth 2 (two) times daily.   aspirin  EC 81 MG tablet Take 81 mg by mouth daily. Swallow whole.   atorvastatin  40 MG tablet Commonly known as:  LIPITOR TAKE ONE TABLET BY MOUTH EVERY EVENING   Biktarvy  50-200-25 MG Tabs tablet Generic drug: bictegravir-emtricitabine -tenofovir  AF Take 1 tablet by mouth daily.   fexofenadine  180 MG tablet Commonly known as: ALLEGRA  Take 180 mg by mouth daily.    folic acid  1 MG tablet Commonly known as: FOLVITE  Take 1 mg by mouth daily.   hydrOXYzine 25 MG capsule Commonly known as: VISTARIL Take 25 mg by mouth 2 (two) times daily.   levETIRAcetam  500 MG tablet Commonly known as: Keppra  Take 1 tablet (500 mg total) by mouth 2 (two) times daily.   levofloxacin 750 MG tablet Commonly known as: Levaquin Take 1 tablet (750 mg total) by mouth daily for 5 days.   traMADol 50 MG tablet Commonly known as: ULTRAM Take 150 mg by mouth 2 (two) times daily as needed for moderate pain (pain score 4-6).   VITAMIN B50 COMPLEX PO Take 1 tablet by mouth daily.        Allergies[1]  Consultations: None   Procedures/Studies: DG Chest 2 View Result Date: 03/19/2024 CLINICAL DATA:  Altered mental status.  Cough. EXAM: CHEST - 2 VIEW COMPARISON:  March 30, 2021. FINDINGS: The heart size and mediastinal contours are within normal limits. Hyperinflation of the lungs is noted. Left lower lobe airspace opacity is noted concerning for possible pneumonia. The visualized skeletal structures are unremarkable. IMPRESSION: Left lower lobe airspace opacity is noted concerning for possible pneumonia. Followup PA and lateral chest X-ray is recommended in 3-4 weeks following trial of antibiotic therapy to ensure resolution and exclude underlying malignancy. Electronically Signed   By: Lynwood Landy Raddle M.D.   On: 03/19/2024 16:44      Subjective: Seen and examined on day of dc Stable, appropriate for return to care home  Discharge Exam: Vitals:   03/22/24 0426 03/22/24 0739  BP: 112/66 111/89  Pulse: 69 83  Resp: 17 16  Temp: 98.5 F (36.9 C) 97.7 F (36.5 C)  SpO2: 97% 99%   Vitals:   03/21/24 1600 03/21/24 2018 03/22/24 0426 03/22/24 0739  BP: 117/69 118/71 112/66 111/89  Pulse: 62 74 69 83  Resp: 17 18 17 16   Temp: 98 F (36.7 C) 97.9 F (36.6 C) 98.5 F (36.9 C) 97.7 F (36.5 C)  TempSrc:   Oral   SpO2: 99% 98% 97% 99%  Weight:   53.4 kg    Height:        General: Pt is alert, awake, not in acute distress Cardiovascular: RRR, S1/S2 +, no rubs, no gallops Respiratory: CTA bilaterally, no wheezing, no rhonchi Abdominal: Soft, NT, ND, bowel sounds + Extremities: no edema, no cyanosis    The results of significant diagnostics from this hospitalization (including imaging, microbiology, ancillary and laboratory) are listed below for reference.     Microbiology: Recent Results (from the past 240 hours)  Blood culture (routine x 2)     Status: None (Preliminary result)   Collection Time: 03/19/24  3:53 PM   Specimen: BLOOD  Result Value Ref Range Status   Specimen Description BLOOD LEFT ANTECUBITAL  Final   Special Requests   Final    BOTTLES DRAWN AEROBIC AND ANAEROBIC Blood Culture results may not be optimal due to an inadequate volume of blood received in culture bottles   Culture   Final    NO GROWTH 3 DAYS Performed at [facility], [address], [place], [place] [address]    Report Status PENDING  Incomplete  Blood culture (routine x  2)     Status: None (Preliminary result)   Collection Time: 03/19/24  4:10 PM   Specimen: BLOOD  Result Value Ref Range Status   Specimen Description   Final    BLOOD BLOOD LEFT ARM Performed at [facility], [address], [place], [place] [address]    Special Requests   Final    BOTTLES DRAWN AEROBIC AND ANAEROBIC Blood Culture results may not be optimal due to an inadequate volume of blood received in culture bottles Performed at [facility], [address] [place], [place] [address]    Culture  Setup Time   Final    GRAM POSITIVE COCCI ANAEROBIC BOTTLE ONLY CRITICAL RESULT CALLED TO, READ BACK BY AND VERIFIED WITH: TREY GREENWOOD 03/20/24 1245 MW    Culture   Final    GRAM POSITIVE COCCI TOO YOUNG TO READ Performed at [facility], [address], [place], [place] [address]    Report Status PENDING  Incomplete  Blood Culture ID  Panel (Reflexed)     Status: Abnormal   Collection Time: 03/19/24  4:10 PM  Result Value Ref Range Status   Enterococcus faecalis NOT DETECTED NOT DETECTED Final   Enterococcus Faecium NOT DETECTED NOT DETECTED Final   Listeria monocytogenes NOT DETECTED NOT DETECTED Final   Staphylococcus species DETECTED (A) NOT DETECTED Final    Comment: CRITICAL RESULT CALLED TO, READ BACK BY AND VERIFIED WITH: TREY GREENWOOD 03/20/24 1245 MW    Staphylococcus aureus (BCID) NOT DETECTED NOT DETECTED Final   Staphylococcus epidermidis DETECTED (A) NOT DETECTED Final    Comment: CRITICAL RESULT CALLED TO, READ BACK BY AND VERIFIED WITH: TREY GREENWOOD 03/20/24 1245 MW    Staphylococcus lugdunensis NOT DETECTED NOT DETECTED Final   Streptococcus species NOT DETECTED NOT DETECTED Final   Streptococcus agalactiae NOT DETECTED NOT DETECTED Final   Streptococcus pneumoniae NOT DETECTED NOT DETECTED Final   Streptococcus pyogenes NOT DETECTED NOT DETECTED Final   A.calcoaceticus-baumannii NOT DETECTED NOT DETECTED Final   Bacteroides fragilis NOT DETECTED NOT DETECTED Final   Enterobacterales NOT DETECTED NOT DETECTED Final   Enterobacter cloacae complex NOT DETECTED NOT DETECTED Final   Escherichia coli NOT DETECTED NOT DETECTED Final   Klebsiella aerogenes NOT DETECTED NOT DETECTED Final   Klebsiella oxytoca NOT DETECTED NOT DETECTED Final   Klebsiella pneumoniae NOT DETECTED NOT DETECTED Final   Proteus species NOT DETECTED NOT DETECTED Final   Salmonella species NOT DETECTED NOT DETECTED Final   Serratia marcescens NOT DETECTED NOT DETECTED Final   Haemophilus influenzae NOT DETECTED NOT DETECTED Final   Neisseria meningitidis NOT DETECTED NOT DETECTED Final   Pseudomonas aeruginosa NOT DETECTED NOT DETECTED Final   Stenotrophomonas maltophilia NOT DETECTED NOT DETECTED Final   Candida albicans NOT DETECTED NOT DETECTED Final   Candida auris NOT DETECTED NOT DETECTED Final   Candida glabrata NOT  DETECTED NOT DETECTED Final   Candida krusei NOT DETECTED NOT DETECTED Final   Candida parapsilosis NOT DETECTED NOT DETECTED Final   Candida tropicalis NOT DETECTED NOT DETECTED Final   Cryptococcus neoformans/gattii NOT DETECTED NOT DETECTED Final   Methicillin resistance mecA/C NOT DETECTED NOT DETECTED Final    Comment: Performed at [facility], [address] [place], [place] [address]     Labs: BNP (last 3 results) No results for input(s): BNP in the last 8760 hours. Basic Metabolic Panel: Recent Labs  Lab 03/19/24 1331 03/20/24 0555 03/21/24 0432 03/22/24 0541  NA 141 141  138 142  K 3.7 4.0 3.4* 3.5  CL 108 112* 108 111  CO2 22 25 23 24   GLUCOSE 139* 117* 111* 141*  BUN 33* 24* 20 20  CREATININE 1.70* 1.37* 1.15 1.02  CALCIUM  9.3 8.3* 7.9* 8.4*  MG  --   --   --  1.8  PHOS  --   --   --  2.4*   Liver Function Tests: Recent Labs  Lab 03/19/24 1553  AST 102*  ALT 107*  ALKPHOS 142*  BILITOT 0.9  PROT 8.0  ALBUMIN 2.8*   Recent Labs  Lab 03/19/24 1553  LIPASE 38   No results for input(s): AMMONIA in the last 168 hours. CBC: Recent Labs  Lab 03/19/24 1331 03/20/24 0555 03/21/24 0432 03/22/24 0819  WBC 23.2* 21.3* 17.5* 13.2*  NEUTROABS  --   --  15.2* 11.3*  HGB 11.7* 9.9* 9.6* 9.8*  HCT 34.2* 27.8* 27.5* 28.1*  MCV 96.6 94.2 95.2 95.3  PLT 375 307 314 350   Cardiac Enzymes: No results for input(s): CKTOTAL, CKMB, CKMBINDEX, TROPONINI in the last 168 hours. BNP: Invalid input(s): POCBNP CBG: No results for input(s): GLUCAP in the last 168 hours. D-Dimer No results for input(s): DDIMER in the last 72 hours. Hgb A1c No results for input(s): HGBA1C in the last 72 hours. Lipid Profile No results for input(s): CHOL, HDL, LDLCALC, TRIG, CHOLHDL, LDLDIRECT in the last 72 hours. Thyroid  function studies No results for input(s): TSH, T4TOTAL, T3FREE, THYROIDAB in the last 72 hours.  Invalid  input(s): FREET3 Anemia work up No results for input(s): VITAMINB12, FOLATE, FERRITIN, TIBC, IRON, RETICCTPCT in the last 72 hours. Urinalysis    Component Value Date/Time   COLORURINE YELLOW (A) 08/19/2015 1139   APPEARANCEUR CLEAR (A) 08/19/2015 1139   LABSPEC 1.013 08/19/2015 1139   PHURINE 6.0 08/19/2015 1139   GLUCOSEU NEGATIVE 08/19/2015 1139   HGBUR NEGATIVE 08/19/2015 1139   BILIRUBINUR NEGATIVE 08/19/2015 1139   KETONESUR NEGATIVE 08/19/2015 1139   PROTEINUR NEGATIVE 08/19/2015 1139   UROBILINOGEN 0.2 05/16/2011 1219   NITRITE NEGATIVE 08/19/2015 1139   LEUKOCYTESUR NEGATIVE 08/19/2015 1139   Sepsis Labs Recent Labs  Lab 03/19/24 1331 03/20/24 0555 03/21/24 0432 03/22/24 0819  WBC 23.2* 21.3* 17.5* 13.2*   Microbiology Recent Results (from the past 240 hours)  Blood culture (routine x 2)     Status: None (Preliminary result)   Collection Time: 03/19/24  3:53 PM   Specimen: BLOOD  Result Value Ref Range Status   Specimen Description BLOOD LEFT ANTECUBITAL  Final   Special Requests   Final    BOTTLES DRAWN AEROBIC AND ANAEROBIC Blood Culture results may not be optimal due to an inadequate volume of blood received in culture bottles   Culture   Final    NO GROWTH 3 DAYS Performed at [facility], [address], [place], [place] [address]    Report Status PENDING  Incomplete  Blood culture (routine x 2)     Status: None (Preliminary result)   Collection Time: 03/19/24  4:10 PM   Specimen: BLOOD  Result Value Ref Range Status   Specimen Description   Final    BLOOD BLOOD LEFT ARM Performed at [facility], [address], [place], [place] [address]    Special Requests   Final    BOTTLES DRAWN AEROBIC AND ANAEROBIC Blood Culture results may not be optimal due to an inadequate volume of blood received in culture bottles Performed at [facility], [address] [place], [place] [address]    Culture  Setup Time    Final    GRAM POSITIVE COCCI ANAEROBIC BOTTLE ONLY CRITICAL RESULT CALLED TO, READ BACK BY AND VERIFIED WITH: TREY GREENWOOD 03/20/24 1245 MW    Culture   Final    GRAM POSITIVE COCCI TOO YOUNG TO READ Performed at [facility], [address] [place], [place] [address]    Report Status PENDING  Incomplete  Blood Culture ID Panel (Reflexed)     Status: Abnormal   Collection Time: 03/19/24  4:10 PM  Result Value Ref Range Status   Enterococcus faecalis NOT DETECTED NOT DETECTED Final   Enterococcus Faecium NOT DETECTED NOT DETECTED Final   Listeria monocytogenes NOT DETECTED NOT DETECTED Final   Staphylococcus species DETECTED (A) NOT DETECTED Final    Comment: CRITICAL RESULT CALLED TO, READ BACK BY AND VERIFIED WITH: TREY GREENWOOD 03/20/24 1245 MW    Staphylococcus aureus (BCID) NOT DETECTED NOT DETECTED Final   Staphylococcus epidermidis DETECTED (A) NOT DETECTED Final    Comment: CRITICAL RESULT CALLED TO, READ BACK BY AND VERIFIED WITH: TREY GREENWOOD 03/20/24 1245 MW    Staphylococcus lugdunensis NOT DETECTED NOT DETECTED Final   Streptococcus species NOT DETECTED NOT DETECTED Final   Streptococcus agalactiae NOT DETECTED NOT DETECTED Final   Streptococcus pneumoniae NOT DETECTED NOT DETECTED Final   Streptococcus pyogenes NOT DETECTED NOT DETECTED Final   A.calcoaceticus-baumannii NOT DETECTED NOT DETECTED Final   Bacteroides fragilis NOT DETECTED NOT DETECTED Final   Enterobacterales NOT DETECTED NOT DETECTED Final   Enterobacter cloacae complex NOT DETECTED NOT DETECTED Final   Escherichia coli NOT DETECTED NOT DETECTED Final   Klebsiella aerogenes NOT DETECTED NOT DETECTED Final   Klebsiella oxytoca NOT DETECTED NOT DETECTED Final   Klebsiella pneumoniae NOT DETECTED NOT DETECTED Final   Proteus species NOT DETECTED NOT DETECTED Final   Salmonella species NOT DETECTED NOT DETECTED Final   Serratia marcescens NOT DETECTED NOT DETECTED Final   Haemophilus  influenzae NOT DETECTED NOT DETECTED Final   Neisseria meningitidis NOT DETECTED NOT DETECTED Final   Pseudomonas aeruginosa NOT DETECTED NOT DETECTED Final   Stenotrophomonas maltophilia NOT DETECTED NOT DETECTED Final   Candida albicans NOT DETECTED NOT DETECTED Final   Candida auris NOT DETECTED NOT DETECTED Final   Candida glabrata NOT DETECTED NOT DETECTED Final   Candida krusei NOT DETECTED NOT DETECTED Final   Candida parapsilosis NOT DETECTED NOT DETECTED Final   Candida tropicalis NOT DETECTED NOT DETECTED Final   Cryptococcus neoformans/gattii NOT DETECTED NOT DETECTED Final   Methicillin resistance mecA/C NOT DETECTED NOT DETECTED Final    Comment: Performed at [facility], [address] [place], [place] [address]     Time coordinating discharge: 40 minutes   SIGNED:   Calvin KATHEE Robson, MD  [facility] 03/22/2024, 9:20 AM Pager   If 7PM-7AM, please contact night-coverage     [1] No Known Allergies

## 2024-03-22 NOTE — Progress Notes (Signed)
 Mobility Specialist - Progress Note   03/22/24 1109  Mobility  Activity Ambulated with assistance  Level of Assistance Contact guard assist, steadying assist  Assistive Device None  Distance Ambulated (ft) 24 ft  Activity Response Tolerated well  Mobility visit 1 Mobility  Mobility Specialist Start Time (ACUTE ONLY) 1010  Mobility Specialist Stop Time (ACUTE ONLY) 1042  Mobility Specialist Time Calculation (min) (ACUTE ONLY) 32 min   Pt standing near the bedside upon arrival, utilizing RA--- requesting to get dressed. Pt required MinA to doff/don clothing and returned to bed. Pt left seated EOB with alarm set and needs within reach.   America Silvan Mobility Specialist 03/22/24 11:14 AM

## 2024-03-22 NOTE — TOC Transition Note (Signed)
 Transition of Care [facility]) - Discharge Note   Patient Details  Name: Brian Marquez MRN: [id number] Date of Birth: Feb 06, 1961  Transition of [facility] CM/SW Contact:  Corean ONEIDA Haddock, RN Phone Number: 03/22/2024, 11:09 AM   Clinical Narrative:       Patient will DC to: [facility] Anticipated DC date:03/22/24 Transport by: Daphne with [facility] to arrange cab transport   Per MD patient ready for DC to . RN, patient, and facility notified of DC. Discharge Summary and  sent to facility. RN given number for report. .   TOC signing off.       Patient Goals and CMS Choice            Discharge Placement                       Discharge Plan and Services Additional resources added to the After Visit Summary for                                       Social Drivers of Health (SDOH) Interventions SDOH Screenings   Food Insecurity: No Food Insecurity (03/19/2024)  Housing: Unknown (03/19/2024)  Transportation Needs: Unmet Transportation Needs (03/19/2024)  Utilities: Patient Declined (03/19/2024)  Depression (PHQ2-9): Low Risk (06/27/2023)  Social Connections: Patient Declined (03/19/2024)  Tobacco Use: High Risk (03/19/2024)     Readmission Risk Interventions     No data to display

## 2024-03-22 NOTE — Plan of Care (Signed)
  Problem: Education: Goal: Knowledge of General Education information will improve Description: Including pain rating scale, medication(s)/side effects and non-pharmacologic comfort measures Outcome: Adequate for Discharge   Problem: Health Behavior/Discharge Planning: Goal: Ability to manage health-related needs will improve Outcome: Adequate for Discharge   Problem: Clinical Measurements: Goal: Ability to maintain clinical measurements within normal limits will improve Outcome: Adequate for Discharge Goal: Will remain free from infection Outcome: Adequate for Discharge Goal: Diagnostic test results will improve Outcome: Adequate for Discharge Goal: Respiratory complications will improve Outcome: Adequate for Discharge Goal: Cardiovascular complication will be avoided Outcome: Adequate for Discharge   Problem: Activity: Goal: Risk for activity intolerance will decrease Outcome: Adequate for Discharge   Problem: Nutrition: Goal: Adequate nutrition will be maintained Outcome: Adequate for Discharge   Problem: Coping: Goal: Level of anxiety will decrease Outcome: Adequate for Discharge   Problem: Elimination: Goal: Will not experience complications related to bowel motility Outcome: Adequate for Discharge Goal: Will not experience complications related to urinary retention Outcome: Adequate for Discharge   Problem: Pain Managment: Goal: General experience of comfort will improve and/or be controlled Outcome: Adequate for Discharge   Problem: Safety: Goal: Ability to remain free from injury will improve Outcome: Adequate for Discharge   Problem: Skin Integrity: Goal: Risk for impaired skin integrity will decrease Outcome: Adequate for Discharge   Problem: Fluid Volume: Goal: Hemodynamic stability will improve Outcome: Adequate for Discharge   Problem: Clinical Measurements: Goal: Diagnostic test results will improve Outcome: Adequate for Discharge Goal: Signs  and symptoms of infection will decrease Outcome: Adequate for Discharge   Problem: Respiratory: Goal: Ability to maintain adequate ventilation will improve Outcome: Adequate for Discharge

## 2024-03-24 LAB — BLOOD CULTURE ID PANEL (REFLEXED) - BCID2

## 2024-03-24 LAB — CULTURE, BLOOD (ROUTINE X 2): Culture: NO GROWTH

## 2024-03-25 ENCOUNTER — Telehealth: Payer: Self-pay | Admitting: Pharmacist

## 2024-03-25 ENCOUNTER — Telehealth: Payer: Self-pay | Admitting: Internal Medicine

## 2024-03-25 NOTE — Telephone Encounter (Signed)
 Blood Cx+ budding yeast 1/20 1/4 bottles + budding yeast, Leuconostoc species, staph epi Blood Cx are polymicrobial and 1/4 bottles as such suspect contamination although budding yeast +  would like pt to come in for repeat blood cx and work up. Left voicemail.

## 2024-03-25 NOTE — Progress Notes (Signed)
 Pharmacy - Antimicrobial Stewardship  Blood culture from 03/19/24 updated last evening to include yeast, identification of yeast expected to be back on 1/27. Blood culture also has S. Epidermidis and leuconostoc species.  Case discussed with Dr Fayette with infectious diseases.    I called and spoke to Daphne, nurse at [facility].  Patient doing well and at his baseline.  No fever or chills.  Explained updated blood culture and unsure of significance, could be a contaminant.  Plan per Dr Fayette is to call in fluconazole 600mg  x 1 then 400mg  daily  x 4 days pending final culture result/identification.  Called script into [facility] Informed her to call 911 and have him return to ED if develops fever or any other concern for worsening.    Erlinda Solinger, PharmD, BCPS, BCIDP Work Cell: [phone] 03/25/2024 12:24 PM

## 2024-03-26 ENCOUNTER — Ambulatory Visit: Payer: Self-pay

## 2024-03-26 ENCOUNTER — Telehealth: Payer: Self-pay | Admitting: Pharmacist

## 2024-03-26 NOTE — Progress Notes (Signed)
 Pharmacy - Antimicrobial Stewardship   Blood culture from 03/19/24 updated last evening to include yeast, identification of yeast expected to be back on 1/27. Blood culture also has S. Epidermidis and leuconostoc species.  Case discussed with Dr Fayette with infectious diseases.     I called and spoke to Daphne, nurse at [facility].  Patient doing well and at his baseline.  No fever or chills.  Explained updated blood culture and unsure of significance, could be a contaminant.  Plan per Dr Fayette is to call in fluconazole 600mg  x 1 then 400mg  daily  x 4 days pending final culture result/identification.  Called script into [facility] Informed her to call 911 and have him return to ED if develops fever or any other concern for worsening.   ADDENDUM 03/26/2024 11:53 AM Received call from Tomadur at microbiology lab correcting the initial read of yeast on the gram stain to [facility].  Informed Dr Fayette and blood culture consistent with contaminated sample.  Called Brandi back at [facility] and instructed patient can stop the fluconazole.     Taro Hidrogo, PharmD, BCPS, BCIDP Work Cell: [phone] 03/26/2024 11:54 AM

## 2024-03-27 LAB — CULTURE, BLOOD (ROUTINE X 2)

## 2024-07-10 ENCOUNTER — Ambulatory Visit: Admitting: Infectious Disease
